# Patient Record
Sex: Female | Born: 1985 | Hispanic: Yes | Marital: Single
Health system: Southern US, Academic
[De-identification: ages and names within clinical notes are randomized; demographics above are authoritative.]

## PROBLEM LIST (undated history)

## (undated) ENCOUNTER — Encounter: Attending: Family | Primary: Family

## (undated) ENCOUNTER — Encounter: Attending: Internal Medicine | Primary: Internal Medicine

## (undated) ENCOUNTER — Ambulatory Visit: Payer: PRIVATE HEALTH INSURANCE

## (undated) ENCOUNTER — Encounter

## (undated) ENCOUNTER — Ambulatory Visit

## (undated) ENCOUNTER — Telehealth

## (undated) ENCOUNTER — Ambulatory Visit: Payer: Worker's Compensation | Attending: Retina Specialist | Primary: Retina Specialist

## (undated) ENCOUNTER — Telehealth: Attending: Family | Primary: Family

## (undated) ENCOUNTER — Ambulatory Visit: Payer: PRIVATE HEALTH INSURANCE | Attending: Dermatology | Primary: Dermatology

## (undated) ENCOUNTER — Encounter: Payer: Medicaid (Managed Care) | Attending: Nutritionist | Primary: Nutritionist

## (undated) ENCOUNTER — Ambulatory Visit: Payer: Medicaid (Managed Care)

## (undated) ENCOUNTER — Ambulatory Visit
Payer: PRIVATE HEALTH INSURANCE | Attending: Student in an Organized Health Care Education/Training Program | Primary: Student in an Organized Health Care Education/Training Program

## (undated) ENCOUNTER — Ambulatory Visit: Payer: PRIVATE HEALTH INSURANCE | Attending: Family | Primary: Family

## (undated) ENCOUNTER — Ambulatory Visit: Payer: PRIVATE HEALTH INSURANCE | Attending: Internal Medicine | Primary: Internal Medicine

## (undated) ENCOUNTER — Inpatient Hospital Stay

## (undated) ENCOUNTER — Telehealth: Attending: Ambulatory Care | Primary: Ambulatory Care

## (undated) ENCOUNTER — Encounter: Attending: "Endocrinology | Primary: "Endocrinology

## (undated) ENCOUNTER — Ambulatory Visit: Payer: Medicaid (Managed Care) | Attending: Family | Primary: Family

## (undated) ENCOUNTER — Ambulatory Visit: Payer: Vision Other Private Insurance | Attending: Family | Primary: Family

## (undated) ENCOUNTER — Encounter
Attending: Student in an Organized Health Care Education/Training Program | Primary: Student in an Organized Health Care Education/Training Program

## (undated) ENCOUNTER — Ambulatory Visit: Payer: MEDICAID | Attending: Family | Primary: Family

## (undated) ENCOUNTER — Ambulatory Visit: Attending: Family | Primary: Family

## (undated) ENCOUNTER — Ambulatory Visit: Attending: Pharmacist | Primary: Pharmacist

## (undated) ENCOUNTER — Encounter: Attending: Dermatology | Primary: Dermatology

## (undated) ENCOUNTER — Ambulatory Visit: Payer: PRIVATE HEALTH INSURANCE | Attending: "Endocrinology | Primary: "Endocrinology

## (undated) ENCOUNTER — Encounter: Attending: Ambulatory Care | Primary: Ambulatory Care

## (undated) ENCOUNTER — Ambulatory Visit: Payer: PRIVATE HEALTH INSURANCE | Attending: Retina Specialist | Primary: Retina Specialist

## (undated) ENCOUNTER — Ambulatory Visit
Payer: Vision Other Private Insurance | Attending: Rehabilitative and Restorative Service Providers" | Primary: Rehabilitative and Restorative Service Providers"

## (undated) ENCOUNTER — Ambulatory Visit: Attending: Retina Specialist | Primary: Retina Specialist

## (undated) ENCOUNTER — Ambulatory Visit
Payer: Medicaid (Managed Care) | Attending: Student in an Organized Health Care Education/Training Program | Primary: Student in an Organized Health Care Education/Training Program

## (undated) ENCOUNTER — Telehealth
Attending: Student in an Organized Health Care Education/Training Program | Primary: Student in an Organized Health Care Education/Training Program

## (undated) ENCOUNTER — Ambulatory Visit
Payer: PRIVATE HEALTH INSURANCE | Attending: Physical Medicine & Rehabilitation | Primary: Physical Medicine & Rehabilitation

## (undated) ENCOUNTER — Ambulatory Visit: Attending: Internal Medicine | Primary: Internal Medicine

## (undated) ENCOUNTER — Encounter: Attending: Pharmacist | Primary: Pharmacist

## (undated) ENCOUNTER — Institutional Professional Consult (permissible substitution): Payer: PRIVATE HEALTH INSURANCE | Attending: Internal Medicine | Primary: Internal Medicine

## (undated) ENCOUNTER — Ambulatory Visit: Payer: Medicare (Managed Care)

## (undated) ENCOUNTER — Encounter: Payer: Medicaid (Managed Care) | Attending: Family | Primary: Family

## (undated) ENCOUNTER — Encounter: Attending: Women's Health | Primary: Women's Health

## (undated) ENCOUNTER — Encounter: Attending: Retina Specialist | Primary: Retina Specialist

## (undated) ENCOUNTER — Encounter: Attending: Hematology | Primary: Hematology

## (undated) ENCOUNTER — Telehealth: Attending: Internal Medicine | Primary: Internal Medicine

---
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
## 2007-04-18 ENCOUNTER — Emergency Department: Payer: Self-pay | Admitting: Internal Medicine

## 2008-10-28 IMAGING — CR RIGHT TIBIA AND FIBULA - 2 VIEW
1 series · 2 of 2 positions shown · non-contrast
Comparison: none

REASON FOR EXAM: injury
COMMENTS:   LMP: [DATE]

RESULT:     AP lateral views of the RIGHT tibia and fibula reveal the shafts
of the bones to be intact. There is no periosteal reaction, fracture, lytic
or blastic lesion. The observed portions of the knee and ankle are normal.

[Series 1: view not recorded · 0.17mm/px · 2 of 2 slices shown]
[im 1/2]
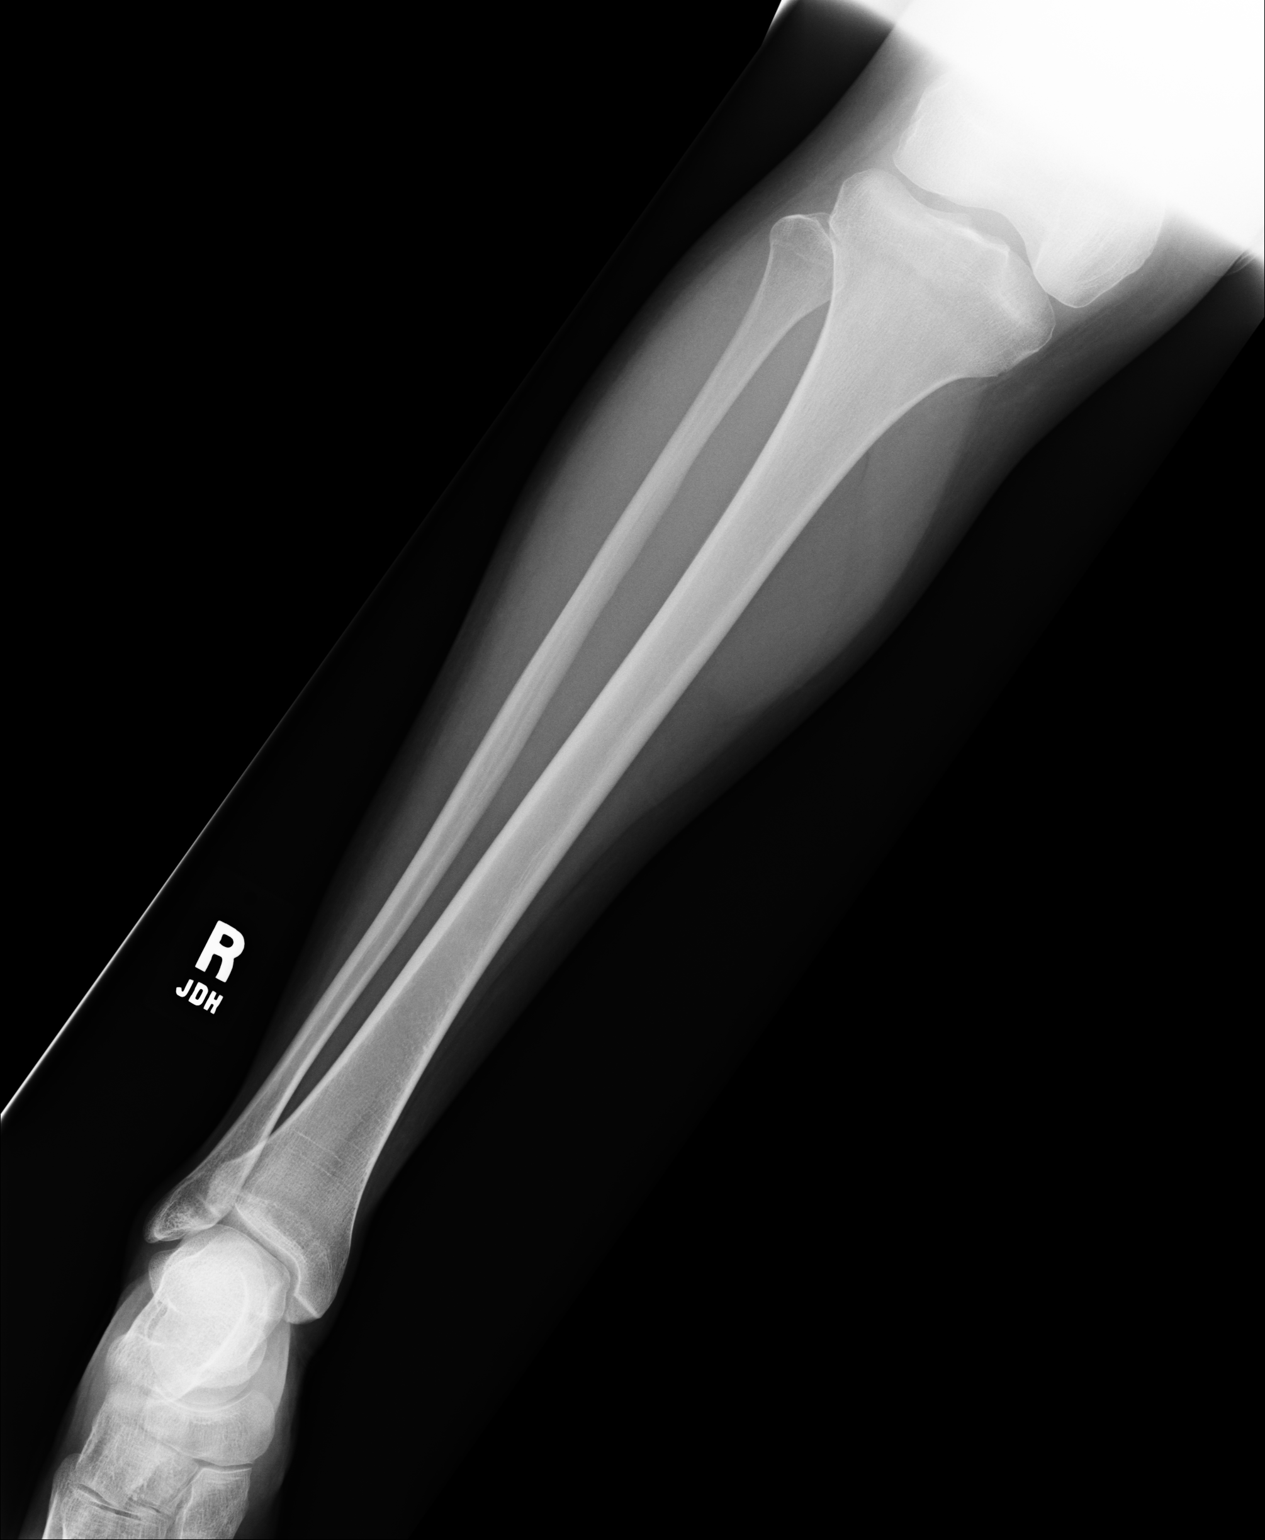
[im 2/2]
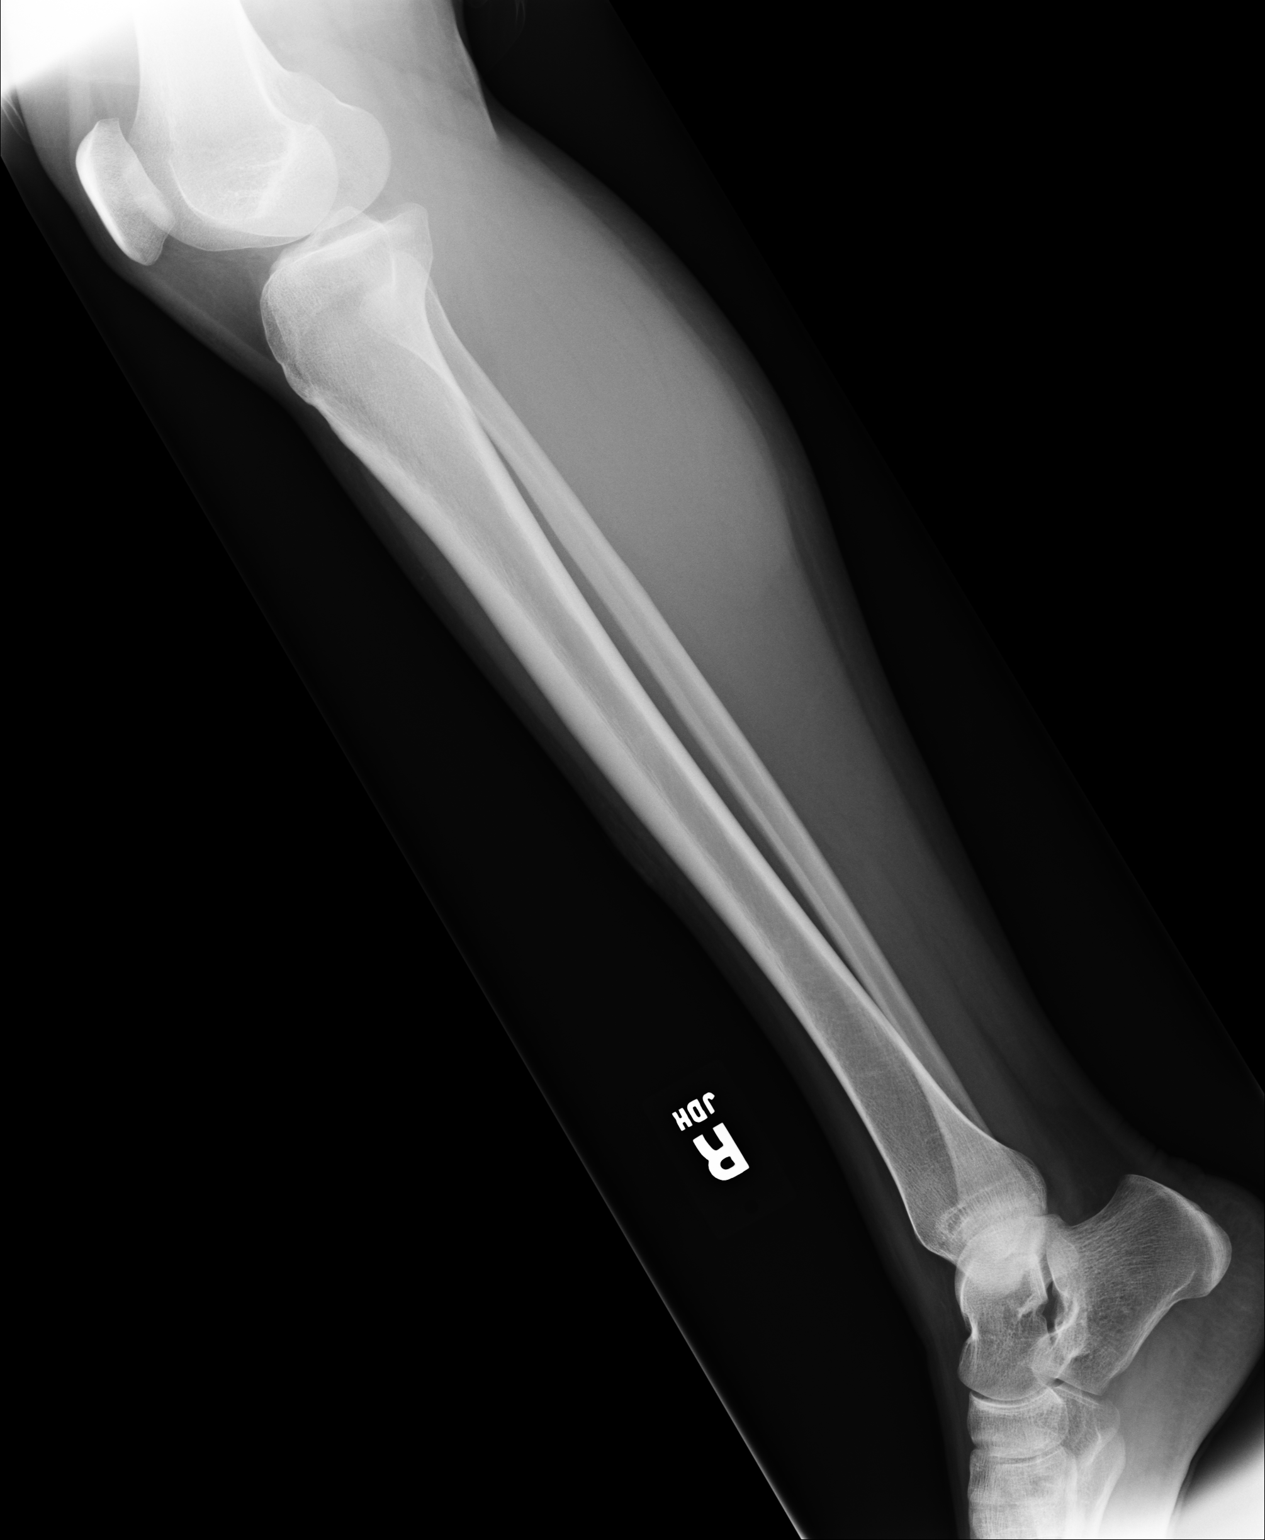

[2 of 2 positions shown; findings below may reference images not displayed]

IMPRESSION: I see no acute abnormality of the shafts of the RIGHT tibia or fibula.
Followup films are available coned to any area of persistent symptoms.

## 2011-12-17 ENCOUNTER — Emergency Department: Payer: Self-pay | Admitting: Emergency Medicine

## 2012-10-25 ENCOUNTER — Emergency Department: Payer: Self-pay | Admitting: Emergency Medicine

## 2012-10-25 LAB — COMPREHENSIVE METABOLIC PANEL
Albumin: 3.5 g/dL (ref 3.4–5.0)
Alkaline Phosphatase: 69 U/L (ref 50–136)
Anion Gap: 5 — ABNORMAL LOW (ref 7–16)
BUN: 7 mg/dL (ref 7–18)
Bilirubin,Total: 0.3 mg/dL (ref 0.2–1.0)
EGFR (Non-African Amer.): >60
Glucose: 84 mg/dL (ref 65–99)
Potassium: 3.1 mmol/L — ABNORMAL LOW (ref 3.5–5.1)
SGPT (ALT): 30 U/L (ref 12–78)
Sodium: 139 mmol/L (ref 136–145)
Total Protein: 7.6 g/dL (ref 6.4–8.2)

## 2012-10-25 LAB — URINALYSIS, COMPLETE
Glucose,UR: NEGATIVE mg/dL (ref 0–75)
Protein: NEGATIVE
Squamous Epithelial: 3
WBC UR: 105 /HPF (ref 0–5)

## 2012-10-25 LAB — CBC
MCH: 25 pg — ABNORMAL LOW (ref 26.0–34.0)
MCHC: 30 g/dL — ABNORMAL LOW (ref 32.0–36.0)
Platelet: 247 10*3/uL (ref 150–440)
RDW: 15.4 % — ABNORMAL HIGH (ref 11.5–14.5)
WBC: 7.2 10*3/uL (ref 3.6–11.0)

## 2017-02-20 ENCOUNTER — Ambulatory Visit
Admission: RE | Admit: 2017-02-20 | Discharge: 2017-02-20 | Disposition: A | Discharge status: Home / Self Care | Payer: BC Managed Care – PPO

## 2017-02-20 DIAGNOSIS — M3219 Other organ or system involvement in systemic lupus erythematosus: Principal | ICD-10-CM

## 2017-02-20 MED ORDER — OXYCODONE 5 MG TABLET
ORAL_TABLET | 0 refills | 0 days | Status: CP
Start: 2017-02-20 — End: 2017-03-13

## 2017-02-27 MED ORDER — AZATHIOPRINE 50 MG TABLET
ORAL_TABLET | Freq: Every day | ORAL | 2 refills | 0 days | Status: CP
Start: 2017-02-27 — End: 2017-07-07

## 2017-03-13 ENCOUNTER — Ambulatory Visit
Admission: RE | Admit: 2017-03-13 | Discharge: 2017-03-13 | Disposition: A | Discharge status: Home / Self Care | Payer: BC Managed Care – PPO | Attending: Medical | Admitting: Medical

## 2017-03-13 DIAGNOSIS — M3219 Other organ or system involvement in systemic lupus erythematosus: Principal | ICD-10-CM

## 2017-03-13 DIAGNOSIS — G8929 Other chronic pain: Secondary | ICD-10-CM

## 2017-03-23 MED ORDER — OXYCODONE 5 MG TABLET
ORAL_TABLET | 0 refills | 0 days | Status: CP
Start: 2017-03-23 — End: 2017-07-07

## 2017-04-09 MED ORDER — L NORGEST/E ESTRADIOL-E ESTRAD 0.15 MG-30 MCG (84)/10 MCG(7) TABS,3MOS
ORAL_TABLET | Freq: Every day | ORAL | 3 refills | 0 days | Status: CP
Start: 2017-04-09 — End: 2018-03-29

## 2017-04-22 MED ORDER — OXYCODONE 5 MG TABLET
ORAL_TABLET | 0 refills | 0 days | Status: CP
Start: 2017-04-22 — End: 2017-06-16

## 2017-05-22 MED ORDER — OXYCODONE 5 MG TABLET
ORAL_TABLET | 0 refills | 0 days | Status: CP
Start: 2017-05-22 — End: 2017-11-06

## 2017-06-02 ENCOUNTER — Emergency Department
Admission: EM | Admit: 2017-06-02 | Discharge: 2017-06-03 | Disposition: A | Discharge status: Home / Self Care | Payer: BC Managed Care – PPO | Source: Intra-hospital

## 2017-06-02 DIAGNOSIS — M7989 Other specified soft tissue disorders: Secondary | ICD-10-CM

## 2017-06-02 DIAGNOSIS — M25562 Pain in left knee: Principal | ICD-10-CM

## 2017-06-16 MED ORDER — OXYCODONE 5 MG TABLET
ORAL_TABLET | 0 refills | 0 days | Status: CP
Start: 2017-06-16 — End: 2017-07-07

## 2017-07-07 ENCOUNTER — Ambulatory Visit: Admission: RE | Admit: 2017-07-07 | Discharge: 2017-07-07 | Disposition: A | Discharge status: Home / Self Care

## 2017-07-07 DIAGNOSIS — L0390 Cellulitis, unspecified: Secondary | ICD-10-CM

## 2017-07-07 DIAGNOSIS — M3219 Other organ or system involvement in systemic lupus erythematosus: Secondary | ICD-10-CM

## 2017-07-07 DIAGNOSIS — G8929 Other chronic pain: Principal | ICD-10-CM

## 2017-07-07 MED ORDER — OXYCODONE 5 MG TABLET
ORAL_TABLET | ORAL | 0 refills | 0.00000 days | Status: CP
Start: 2017-07-07 — End: 2018-01-13

## 2017-07-07 MED ORDER — DOXYCYCLINE HYCLATE 100 MG TABLET,DELAYED RELEASE
ORAL_TABLET | 0 refills | 0 days | Status: CP
Start: 2017-07-07 — End: 2017-12-15

## 2017-07-07 MED ORDER — OXYCODONE 5 MG TABLET: tablet | 0 refills | 0 days | Status: AC

## 2017-07-07 MED ORDER — HYDROXYCHLOROQUINE 200 MG TABLET
ORAL_TABLET | Freq: Every day | ORAL | 10 refills | 0.00000 days | Status: CP
Start: 2017-07-07 — End: 2017-12-15

## 2017-07-07 MED ORDER — AZATHIOPRINE 50 MG TABLET
ORAL_TABLET | Freq: Every day | ORAL | 4 refills | 0 days | Status: CP
Start: 2017-07-07 — End: 2017-12-15

## 2017-07-23 MED ORDER — TIZANIDINE 2 MG TABLET
ORAL_TABLET | Freq: Three times a day (TID) | ORAL | 3 refills | 0 days | Status: CP | PRN
Start: 2017-07-23 — End: 2018-01-11

## 2017-09-29 MED ORDER — OXYCODONE 5 MG TABLET
ORAL_TABLET | 0 refills | 0 days | Status: CP
Start: 2017-09-29 — End: 2017-10-23

## 2017-10-26 MED ORDER — OXYCODONE 5 MG TABLET
ORAL_TABLET | 0 refills | 0 days | Status: CP
Start: 2017-10-26 — End: 2017-11-22

## 2017-11-06 ENCOUNTER — Ambulatory Visit
Admit: 2017-11-06 | Discharge: 2017-11-07 | Payer: PRIVATE HEALTH INSURANCE | Attending: "Endocrinology | Primary: "Endocrinology

## 2017-11-06 DIAGNOSIS — N915 Oligomenorrhea, unspecified: Secondary | ICD-10-CM

## 2017-11-06 DIAGNOSIS — M8580 Other specified disorders of bone density and structure, unspecified site: Principal | ICD-10-CM

## 2017-11-09 MED ORDER — ERGOCALCIFEROL (VITAMIN D2) 1,250 MCG (50,000 UNIT) CAPSULE
ORAL_CAPSULE | ORAL | 0 refills | 0.00000 days | Status: CP
Start: 2017-11-09 — End: 2018-01-26

## 2017-11-23 MED ORDER — OXYCODONE 5 MG TABLET
ORAL_TABLET | 0 refills | 0 days | Status: CP
Start: 2017-11-23 — End: 2017-12-23

## 2017-12-15 ENCOUNTER — Ambulatory Visit
Admit: 2017-12-15 | Discharge: 2017-12-16 | Payer: PRIVATE HEALTH INSURANCE | Attending: Internal Medicine | Primary: Internal Medicine

## 2017-12-15 DIAGNOSIS — M3219 Other organ or system involvement in systemic lupus erythematosus: Principal | ICD-10-CM

## 2017-12-15 DIAGNOSIS — M5440 Lumbago with sciatica, unspecified side: Secondary | ICD-10-CM

## 2017-12-15 DIAGNOSIS — M329 Systemic lupus erythematosus, unspecified: Secondary | ICD-10-CM

## 2017-12-15 DIAGNOSIS — G8929 Other chronic pain: Secondary | ICD-10-CM

## 2017-12-15 MED ORDER — AZATHIOPRINE 50 MG TABLET
ORAL_TABLET | Freq: Every day | ORAL | 4 refills | 0.00000 days | Status: CP
Start: 2017-12-15 — End: 2017-12-24

## 2017-12-15 MED ORDER — HYDROXYCHLOROQUINE 200 MG TABLET
ORAL_TABLET | Freq: Every day | ORAL | 10 refills | 0 days | Status: CP
Start: 2017-12-15 — End: 2018-07-29

## 2017-12-15 MED ORDER — PREDNISONE 1 MG TABLET
ORAL_TABLET | Freq: Every day | ORAL | 3 refills | 0 days | Status: CP
Start: 2017-12-15 — End: 2018-03-01

## 2017-12-15 MED ORDER — GABAPENTIN 300 MG CAPSULE
ORAL_CAPSULE | 3 refills | 0 days | Status: CP
Start: 2017-12-15 — End: 2018-01-15

## 2017-12-23 MED ORDER — TIZANIDINE 2 MG TABLET
ORAL_TABLET | Freq: Three times a day (TID) | ORAL | 0 refills | 0.00000 days | Status: CP | PRN
Start: 2017-12-23 — End: 2018-01-11

## 2017-12-23 MED ORDER — OXYCODONE 5 MG TABLET
ORAL_TABLET | 0 refills | 0 days | Status: CP
Start: 2017-12-23 — End: 2018-01-13

## 2017-12-24 MED ORDER — AZATHIOPRINE 50 MG TABLET
ORAL_TABLET | Freq: Every day | ORAL | 0 refills | 0 days | Status: CP
Start: 2017-12-24 — End: 2018-07-06

## 2018-01-13 MED ORDER — OXYCODONE 5 MG TABLET
ORAL_TABLET | 0 refills | 0 days | Status: CP
Start: 2018-01-13 — End: 2018-03-01

## 2018-01-15 MED ORDER — GABAPENTIN 300 MG CAPSULE
ORAL_CAPSULE | 0 refills | 0 days | Status: CP
Start: 2018-01-15 — End: 2018-03-12

## 2018-01-18 MED ORDER — TIZANIDINE 2 MG TABLET
ORAL_TABLET | Freq: Three times a day (TID) | ORAL | 0 refills | 0 days | Status: CP | PRN
Start: 2018-01-18 — End: 2018-03-01

## 2018-02-12 ENCOUNTER — Ambulatory Visit
Admit: 2018-02-12 | Discharge: 2018-02-12 | Payer: PRIVATE HEALTH INSURANCE | Attending: "Endocrinology | Primary: "Endocrinology

## 2018-02-12 ENCOUNTER — Ambulatory Visit: Admit: 2018-02-12 | Discharge: 2018-02-12 | Payer: PRIVATE HEALTH INSURANCE

## 2018-02-12 DIAGNOSIS — M8580 Other specified disorders of bone density and structure, unspecified site: Principal | ICD-10-CM

## 2018-03-02 MED ORDER — OXYCODONE 5 MG TABLET
ORAL_TABLET | 0 refills | 0 days | Status: CP
Start: 2018-03-02 — End: 2018-04-02

## 2018-03-02 MED ORDER — PREDNISONE 1 MG TABLET
ORAL_TABLET | Freq: Every day | ORAL | 3 refills | 0.00000 days | Status: CP
Start: 2018-03-02 — End: 2018-04-02

## 2018-03-02 MED ORDER — TIZANIDINE 2 MG TABLET
ORAL_TABLET | Freq: Three times a day (TID) | ORAL | 0 refills | 0 days | Status: CP | PRN
Start: 2018-03-02 — End: 2018-05-04

## 2018-03-12 ENCOUNTER — Ambulatory Visit
Admit: 2018-03-12 | Discharge: 2018-03-13 | Payer: PRIVATE HEALTH INSURANCE | Attending: "Endocrinology | Primary: "Endocrinology

## 2018-03-12 DIAGNOSIS — N915 Oligomenorrhea, unspecified: Secondary | ICD-10-CM

## 2018-03-12 DIAGNOSIS — M8580 Other specified disorders of bone density and structure, unspecified site: Principal | ICD-10-CM

## 2018-03-31 MED ORDER — L NORGEST/E ESTRADIOL-E ESTRAD 0.15 MG-30 MCG (84)/10 MCG(7) TABS,3MOS
ORAL_TABLET | Freq: Every day | ORAL | 3 refills | 0 days | Status: CP
Start: 2018-03-31 — End: 2018-06-11

## 2018-04-02 ENCOUNTER — Ambulatory Visit: Admit: 2018-04-02 | Discharge: 2018-04-03 | Payer: PRIVATE HEALTH INSURANCE

## 2018-04-02 DIAGNOSIS — G8929 Other chronic pain: Secondary | ICD-10-CM

## 2018-04-02 DIAGNOSIS — M329 Systemic lupus erythematosus, unspecified: Principal | ICD-10-CM

## 2018-04-02 DIAGNOSIS — N926 Irregular menstruation, unspecified: Secondary | ICD-10-CM

## 2018-04-02 MED ORDER — OXYCODONE 5 MG TABLET
ORAL_TABLET | 0 refills | 0 days | Status: CP
Start: 2018-04-02 — End: 2018-05-03

## 2018-04-02 MED ORDER — PREDNISONE 1 MG TABLET
ORAL_TABLET | Freq: Every day | ORAL | 3 refills | 0 days | Status: CP
Start: 2018-04-02 — End: 2018-10-08

## 2018-04-20 MED ORDER — TRIAMCINOLONE ACETONIDE 0.1 % TOPICAL OINTMENT
Freq: Two times a day (BID) | TOPICAL | 1 refills | 0.00000 days | Status: CP | PRN
Start: 2018-04-20 — End: 2019-04-20

## 2018-05-04 MED ORDER — OXYCODONE 5 MG TABLET: tablet | 0 refills | 0 days | Status: AC

## 2018-05-04 MED ORDER — OXYCODONE 5 MG TABLET
ORAL_TABLET | ORAL | 0 refills | 0.00000 days | Status: CP
Start: 2018-05-04 — End: 2018-05-31

## 2018-05-07 MED ORDER — TIZANIDINE 2 MG TABLET
ORAL_TABLET | Freq: Three times a day (TID) | ORAL | 5 refills | 0 days | Status: CP | PRN
Start: 2018-05-07 — End: 2018-06-30

## 2018-05-14 ENCOUNTER — Ambulatory Visit: Admit: 2018-05-14 | Discharge: 2018-05-15 | Payer: PRIVATE HEALTH INSURANCE

## 2018-05-14 DIAGNOSIS — Z79899 Other long term (current) drug therapy: Principal | ICD-10-CM

## 2018-06-01 MED ORDER — OXYCODONE 5 MG TABLET
ORAL_TABLET | 0 refills | 0 days | Status: CP
Start: 2018-06-01 — End: 2018-07-02

## 2018-06-11 ENCOUNTER — Ambulatory Visit: Admit: 2018-06-11 | Discharge: 2018-06-12 | Payer: PRIVATE HEALTH INSURANCE

## 2018-06-11 DIAGNOSIS — Z01419 Encounter for gynecological examination (general) (routine) without abnormal findings: Principal | ICD-10-CM

## 2018-06-11 MED ORDER — L NORGEST/E ESTRADIOL-E ESTRAD 0.15 MG-30 MCG (84)/10 MCG(7) TABS,3MOS
ORAL_TABLET | Freq: Every day | ORAL | 3 refills | 0.00000 days | Status: CP
Start: 2018-06-11 — End: 2019-03-30

## 2018-07-02 MED ORDER — OXYCODONE 5 MG TABLET
ORAL_TABLET | 0 refills | 0 days | Status: CP
Start: 2018-07-02 — End: 2018-07-29

## 2018-07-02 MED ORDER — TIZANIDINE 2 MG TABLET
ORAL_TABLET | Freq: Three times a day (TID) | ORAL | 5 refills | 0.00000 days | Status: CP | PRN
Start: 2018-07-02 — End: 2018-10-21

## 2018-07-06 ENCOUNTER — Ambulatory Visit: Admit: 2018-07-06 | Discharge: 2018-07-07 | Payer: PRIVATE HEALTH INSURANCE

## 2018-07-06 ENCOUNTER — Ambulatory Visit
Admit: 2018-07-06 | Discharge: 2018-07-07 | Payer: PRIVATE HEALTH INSURANCE | Attending: Internal Medicine | Primary: Internal Medicine

## 2018-07-06 DIAGNOSIS — M549 Dorsalgia, unspecified: Secondary | ICD-10-CM

## 2018-07-06 DIAGNOSIS — M3219 Other organ or system involvement in systemic lupus erythematosus: Secondary | ICD-10-CM

## 2018-07-06 DIAGNOSIS — R05 Cough: Principal | ICD-10-CM

## 2018-07-06 DIAGNOSIS — M329 Systemic lupus erythematosus, unspecified: Secondary | ICD-10-CM

## 2018-07-06 DIAGNOSIS — L659 Nonscarring hair loss, unspecified: Secondary | ICD-10-CM

## 2018-07-06 DIAGNOSIS — R634 Abnormal weight loss: Secondary | ICD-10-CM

## 2018-07-06 DIAGNOSIS — R238 Other skin changes: Secondary | ICD-10-CM

## 2018-07-06 MED ORDER — CLOBETASOL 0.05 % TOPICAL FOAM
Freq: Two times a day (BID) | TOPICAL | 0 refills | 0 days | Status: CP
Start: 2018-07-06 — End: 2018-07-29

## 2018-07-06 MED ORDER — AZATHIOPRINE 50 MG TABLET
ORAL_TABLET | Freq: Every day | ORAL | 0 refills | 0 days | Status: CP
Start: 2018-07-06 — End: 2019-01-04

## 2018-07-06 MED ORDER — DULOXETINE 30 MG CAPSULE,DELAYED RELEASE
ORAL_CAPSULE | 1 refills | 0 days | Status: CP
Start: 2018-07-06 — End: 2018-08-02

## 2018-07-30 ENCOUNTER — Ambulatory Visit
Admit: 2018-07-30 | Discharge: 2018-07-31 | Payer: PRIVATE HEALTH INSURANCE | Attending: Dermatology | Primary: Dermatology

## 2018-07-30 DIAGNOSIS — M329 Systemic lupus erythematosus, unspecified: Principal | ICD-10-CM

## 2018-07-30 MED ORDER — OXYCODONE 5 MG TABLET
ORAL_TABLET | 0 refills | 0 days | Status: CP
Start: 2018-07-30 — End: 2018-08-30

## 2018-07-30 MED ORDER — CLOBETASOL 0.05 % TOPICAL FOAM
Freq: Two times a day (BID) | TOPICAL | 0 refills | 0 days | Status: CP
Start: 2018-07-30 — End: 2018-08-02

## 2018-07-30 MED ORDER — FLUOCINONIDE 0.05 % TOPICAL SOLUTION
Freq: Two times a day (BID) | TOPICAL | 5 refills | 0 days | Status: CP
Start: 2018-07-30 — End: 2019-07-30

## 2018-07-30 MED ORDER — HYDROXYCHLOROQUINE 200 MG TABLET
ORAL_TABLET | Freq: Every day | ORAL | 10 refills | 0.00000 days | Status: CP
Start: 2018-07-30 — End: ?

## 2018-08-02 ENCOUNTER — Ambulatory Visit: Admit: 2018-08-02 | Discharge: 2018-08-02 | Payer: PRIVATE HEALTH INSURANCE

## 2018-08-02 DIAGNOSIS — R634 Abnormal weight loss: Secondary | ICD-10-CM

## 2018-08-02 DIAGNOSIS — M549 Dorsalgia, unspecified: Secondary | ICD-10-CM

## 2018-08-02 DIAGNOSIS — R05 Cough: Principal | ICD-10-CM

## 2018-08-02 DIAGNOSIS — R87810 Cervical high risk human papillomavirus (HPV) DNA test positive: Principal | ICD-10-CM

## 2018-08-02 DIAGNOSIS — M3219 Other organ or system involvement in systemic lupus erythematosus: Secondary | ICD-10-CM

## 2018-08-02 MED ORDER — DULOXETINE 60 MG CAPSULE,DELAYED RELEASE
ORAL_CAPSULE | Freq: Every day | ORAL | 3 refills | 0.00000 days | Status: CP
Start: 2018-08-02 — End: 2018-10-08

## 2018-08-02 MED ORDER — CLOBETASOL 0.05 % TOPICAL GEL
3 refills | 0 days | Status: CP
Start: 2018-08-02 — End: ?

## 2018-08-02 MED ORDER — DULOXETINE 30 MG CAPSULE,DELAYED RELEASE
ORAL_CAPSULE | Freq: Every day | ORAL | 1 refills | 0 days | Status: CP
Start: 2018-08-02 — End: 2018-08-02

## 2018-08-16 MED ORDER — KETOCONAZOLE 2 % SHAMPOO
TOPICAL | 6 refills | 0.00000 days | Status: CP
Start: 2018-08-16 — End: ?

## 2018-08-16 MED ORDER — CLOBETASOL 0.05 % TOPICAL FOAM
OPHTHALMIC | 5 refills | 0.00000 days | Status: CP
Start: 2018-08-16 — End: ?

## 2018-08-16 MED ORDER — CLOBETASOL-EMOLLIENT 0.05 % TOPICAL FOAM
OPHTHALMIC | 2 refills | 0.00000 days | Status: CP
Start: 2018-08-16 — End: 2018-08-16

## 2018-08-31 MED ORDER — OXYCODONE 5 MG TABLET
ORAL_TABLET | 0 refills | 0 days | Status: CP
Start: 2018-08-31 — End: 2018-10-01

## 2018-09-24 MED ORDER — GABAPENTIN 100 MG CAPSULE
ORAL_CAPSULE | 2 refills | 0 days | Status: CP
Start: 2018-09-24 — End: 2018-10-08

## 2018-10-04 MED ORDER — OXYCODONE 5 MG TABLET
ORAL_TABLET | 0 refills | 0 days | Status: CP
Start: 2018-10-04 — End: 2018-10-28

## 2018-10-08 ENCOUNTER — Ambulatory Visit: Admit: 2018-10-08 | Discharge: 2018-10-09 | Payer: PRIVATE HEALTH INSURANCE

## 2018-10-08 DIAGNOSIS — M3219 Other organ or system involvement in systemic lupus erythematosus: Principal | ICD-10-CM

## 2018-10-08 DIAGNOSIS — G8929 Other chronic pain: Secondary | ICD-10-CM

## 2018-10-08 DIAGNOSIS — L659 Nonscarring hair loss, unspecified: Secondary | ICD-10-CM

## 2018-10-08 DIAGNOSIS — N926 Irregular menstruation, unspecified: Secondary | ICD-10-CM

## 2018-10-08 DIAGNOSIS — B0229 Other postherpetic nervous system involvement: Secondary | ICD-10-CM

## 2018-10-08 MED ORDER — LIDOCAINE 5 % TOPICAL PATCH
MEDICATED_PATCH | Freq: Two times a day (BID) | TRANSDERMAL | 3 refills | 0.00000 days | Status: CP
Start: 2018-10-08 — End: 2019-10-08

## 2018-10-12 DIAGNOSIS — G8929 Other chronic pain: Principal | ICD-10-CM

## 2018-10-21 DIAGNOSIS — M7918 Myalgia, other site: Principal | ICD-10-CM

## 2018-10-22 MED ORDER — TIZANIDINE 2 MG TABLET
ORAL_TABLET | Freq: Three times a day (TID) | ORAL | 5 refills | 0 days | Status: CP | PRN
Start: 2018-10-22 — End: 2019-03-30

## 2018-10-28 DIAGNOSIS — G8929 Other chronic pain: Principal | ICD-10-CM

## 2018-10-29 MED ORDER — OXYCODONE 5 MG TABLET
ORAL_TABLET | 0 refills | 0 days | Status: CP
Start: 2018-10-29 — End: 2018-11-30

## 2018-12-03 MED ORDER — OXYCODONE 5 MG TABLET
ORAL_TABLET | 0 refills | 0 days | Status: CP
Start: 2018-12-03 — End: 2019-02-28

## 2018-12-07 MED ORDER — OXYCODONE 5 MG TABLET
ORAL_TABLET | 0 refills | 0 days | Status: CP
Start: 2018-12-07 — End: 2018-12-27

## 2018-12-28 MED ORDER — OXYCODONE 5 MG TABLET
ORAL_TABLET | 0 refills | 0 days | Status: CP
Start: 2018-12-28 — End: 2019-01-28

## 2019-01-04 DIAGNOSIS — M329 Systemic lupus erythematosus, unspecified: Secondary | ICD-10-CM

## 2019-01-04 DIAGNOSIS — M3219 Other organ or system involvement in systemic lupus erythematosus: Principal | ICD-10-CM

## 2019-01-04 DIAGNOSIS — G8929 Other chronic pain: Secondary | ICD-10-CM

## 2019-01-25 MED ORDER — DULOXETINE 30 MG CAPSULE,DELAYED RELEASE
ORAL_CAPSULE | 0 refills | 0 days | Status: CP
Start: 2019-01-25 — End: ?

## 2019-01-25 MED ORDER — AZATHIOPRINE 50 MG TABLET
ORAL_TABLET | Freq: Every day | ORAL | 2 refills | 0.00000 days | Status: CP
Start: 2019-01-25 — End: ?

## 2019-01-31 MED ORDER — OXYCODONE 5 MG TABLET
ORAL_TABLET | 0 refills | 0 days | Status: CP
Start: 2019-01-31 — End: 2019-02-28

## 2019-02-04 ENCOUNTER — Ambulatory Visit: Admit: 2019-02-04 | Discharge: 2019-02-05 | Payer: PRIVATE HEALTH INSURANCE

## 2019-02-04 DIAGNOSIS — M329 Systemic lupus erythematosus, unspecified: Principal | ICD-10-CM

## 2019-03-01 MED ORDER — OXYCODONE 5 MG TABLET
ORAL_TABLET | 0 refills | 0 days | Status: CP
Start: 2019-03-01 — End: 2019-03-28

## 2019-03-31 MED ORDER — L NORGEST/E ESTRADIOL-E ESTRAD 0.15 MG-30 MCG (84)/10 MCG(7) TABS,3MOS
ORAL_TABLET | Freq: Every day | ORAL | 3 refills | 0.00000 days | Status: CP
Start: 2019-03-31 — End: ?

## 2019-03-31 MED ORDER — OXYCODONE 5 MG TABLET
ORAL_TABLET | 0 refills | 0 days | Status: CP
Start: 2019-03-31 — End: ?

## 2019-03-31 MED ORDER — TIZANIDINE 2 MG TABLET
ORAL_TABLET | Freq: Three times a day (TID) | ORAL | 5 refills | 10.00000 days | Status: CP | PRN
Start: 2019-03-31 — End: ?

## 2019-04-29 ENCOUNTER — Ambulatory Visit
Admit: 2019-04-29 | Discharge: 2019-04-30 | Payer: PRIVATE HEALTH INSURANCE | Attending: Internal Medicine | Primary: Internal Medicine

## 2019-04-29 DIAGNOSIS — N926 Irregular menstruation, unspecified: Secondary | ICD-10-CM

## 2019-04-29 DIAGNOSIS — Z79899 Other long term (current) drug therapy: Secondary | ICD-10-CM

## 2019-04-29 DIAGNOSIS — M329 Systemic lupus erythematosus, unspecified: Secondary | ICD-10-CM

## 2019-04-29 DIAGNOSIS — M8580 Other specified disorders of bone density and structure, unspecified site: Secondary | ICD-10-CM

## 2019-04-29 DIAGNOSIS — Z23 Encounter for immunization: Secondary | ICD-10-CM

## 2019-04-29 DIAGNOSIS — G8929 Other chronic pain: Secondary | ICD-10-CM

## 2019-05-01 MED ORDER — OXYCODONE 5 MG TABLET
ORAL_TABLET | 0 refills | 0 days | Status: CP
Start: 2019-05-01 — End: ?

## 2019-06-05 DIAGNOSIS — G8929 Other chronic pain: Principal | ICD-10-CM

## 2019-06-05 MED ORDER — OXYCODONE 5 MG TABLET
ORAL_TABLET | 0 refills | 0 days | Status: CP
Start: 2019-06-05 — End: ?

## 2019-06-17 ENCOUNTER — Ambulatory Visit: Admit: 2019-06-17 | Discharge: 2019-06-18 | Payer: PRIVATE HEALTH INSURANCE

## 2019-06-17 DIAGNOSIS — N870 Mild cervical dysplasia: Principal | ICD-10-CM

## 2019-06-17 DIAGNOSIS — Z01419 Encounter for gynecological examination (general) (routine) without abnormal findings: Principal | ICD-10-CM

## 2019-06-17 MED ORDER — L NORGEST/E ESTRADIOL-E ESTRAD 0.15 MG-30 MCG (84)/10 MCG(7) TABS,3MOS
ORAL_TABLET | Freq: Every day | ORAL | 3 refills | 0.00000 days | Status: CP
Start: 2019-06-17 — End: ?

## 2019-06-28 DIAGNOSIS — G8929 Other chronic pain: Principal | ICD-10-CM

## 2019-06-29 MED ORDER — OXYCODONE 5 MG TABLET
ORAL_TABLET | 0 refills | 0 days | Status: CP
Start: 2019-06-29 — End: ?

## 2019-07-29 ENCOUNTER — Ambulatory Visit: Admit: 2019-07-29 | Discharge: 2019-07-30 | Payer: PRIVATE HEALTH INSURANCE

## 2019-07-29 DIAGNOSIS — R87611 Atypical squamous cells cannot exclude high grade squamous intraepithelial lesion on cytologic smear of cervix (ASC-H): Principal | ICD-10-CM

## 2019-08-02 DIAGNOSIS — G8929 Other chronic pain: Principal | ICD-10-CM

## 2019-08-02 DIAGNOSIS — M7918 Myalgia, other site: Principal | ICD-10-CM

## 2019-08-02 MED ORDER — TIZANIDINE 2 MG TABLET
ORAL_TABLET | Freq: Three times a day (TID) | ORAL | 5 refills | 10.00000 days | Status: CP | PRN
Start: 2019-08-02 — End: ?

## 2019-08-02 MED ORDER — OXYCODONE 5 MG TABLET
ORAL_TABLET | 0 refills | 0 days | Status: CP
Start: 2019-08-02 — End: ?

## 2019-08-30 DIAGNOSIS — G8929 Other chronic pain: Principal | ICD-10-CM

## 2019-09-03 DIAGNOSIS — G8929 Other chronic pain: Principal | ICD-10-CM

## 2019-09-03 MED ORDER — OXYCODONE 5 MG TABLET
ORAL_TABLET | 0 refills | 0 days | Status: CP
Start: 2019-09-03 — End: ?

## 2019-09-30 DIAGNOSIS — G8929 Other chronic pain: Principal | ICD-10-CM

## 2019-10-05 MED ORDER — NORGESTIMATE 0.25 MG-ETHINYL ESTRADIOL 35 MCG TABLET
ORAL_TABLET | Freq: Every day | ORAL | 3 refills | 84.00000 days | Status: CP
Start: 2019-10-05 — End: 2020-10-04

## 2019-10-06 DIAGNOSIS — G8929 Other chronic pain: Principal | ICD-10-CM

## 2019-10-06 DIAGNOSIS — M3219 Other organ or system involvement in systemic lupus erythematosus: Principal | ICD-10-CM

## 2019-10-06 DIAGNOSIS — M329 Systemic lupus erythematosus, unspecified: Principal | ICD-10-CM

## 2019-10-09 MED ORDER — OXYCODONE 5 MG TABLET
ORAL_TABLET | 0 refills | 0 days | Status: CP
Start: 2019-10-09 — End: ?

## 2019-10-09 MED ORDER — HYDROXYCHLOROQUINE 200 MG TABLET
ORAL_TABLET | Freq: Every day | ORAL | 10 refills | 90.00000 days | Status: CP
Start: 2019-10-09 — End: ?

## 2019-10-31 DIAGNOSIS — G8929 Other chronic pain: Principal | ICD-10-CM

## 2019-11-01 MED ORDER — OXYCODONE 5 MG TABLET
ORAL_TABLET | 0 refills | 0 days | Status: CP
Start: 2019-11-01 — End: ?

## 2019-12-01 DIAGNOSIS — G8929 Other chronic pain: Principal | ICD-10-CM

## 2019-12-05 MED ORDER — OXYCODONE 5 MG TABLET
ORAL_TABLET | 0 refills | 0 days | Status: CP
Start: 2019-12-05 — End: ?

## 2019-12-21 DIAGNOSIS — M3219 Other organ or system involvement in systemic lupus erythematosus: Principal | ICD-10-CM

## 2019-12-21 MED ORDER — TRIAMCINOLONE ACETONIDE 0.1 % TOPICAL OINTMENT
Freq: Two times a day (BID) | TOPICAL | 6 refills | 0.00000 days | Status: CP
Start: 2019-12-21 — End: 2020-12-20

## 2019-12-30 DIAGNOSIS — G8929 Other chronic pain: Principal | ICD-10-CM

## 2019-12-30 MED ORDER — OXYCODONE 5 MG TABLET
ORAL_TABLET | 0 refills | 0 days | Status: CP
Start: 2019-12-30 — End: ?

## 2020-01-26 DIAGNOSIS — M7918 Myalgia, other site: Principal | ICD-10-CM

## 2020-01-26 DIAGNOSIS — R21 Rash and other nonspecific skin eruption: Principal | ICD-10-CM

## 2020-01-27 MED ORDER — TIZANIDINE 2 MG TABLET
ORAL_TABLET | Freq: Three times a day (TID) | ORAL | 5 refills | 10 days | Status: CP | PRN
Start: 2020-01-27 — End: ?

## 2020-01-30 DIAGNOSIS — G8929 Other chronic pain: Principal | ICD-10-CM

## 2020-01-30 MED ORDER — OXYCODONE 5 MG TABLET
ORAL_TABLET | 0 refills | 0 days | Status: CP
Start: 2020-01-30 — End: ?

## 2020-02-27 DIAGNOSIS — G8929 Other chronic pain: Principal | ICD-10-CM

## 2020-02-28 DIAGNOSIS — G8929 Other chronic pain: Principal | ICD-10-CM

## 2020-02-28 MED ORDER — OXYCODONE 5 MG TABLET
ORAL_TABLET | ORAL | 0 refills | 0.00000 days | Status: CP
Start: 2020-02-28 — End: ?

## 2020-03-27 ENCOUNTER — Ambulatory Visit: Admit: 2020-03-27 | Discharge: 2020-03-28 | Payer: PRIVATE HEALTH INSURANCE

## 2020-03-27 ENCOUNTER — Ambulatory Visit: Admit: 2020-03-27 | Discharge: 2020-03-28 | Payer: PRIVATE HEALTH INSURANCE | Attending: Family | Primary: Family

## 2020-03-27 DIAGNOSIS — R05 Cough: Principal | ICD-10-CM

## 2020-03-27 DIAGNOSIS — M329 Systemic lupus erythematosus, unspecified: Principal | ICD-10-CM

## 2020-03-27 DIAGNOSIS — M3219 Other organ or system involvement in systemic lupus erythematosus: Principal | ICD-10-CM

## 2020-03-27 MED ORDER — AZATHIOPRINE 50 MG TABLET
ORAL_TABLET | Freq: Every day | ORAL | 0 refills | 45.00000 days | Status: CP
Start: 2020-03-27 — End: ?

## 2020-03-27 MED ORDER — MAGIC MOUTHWASH (NYSTATIN/DIPHENHYDRAMINE/MYLANTA) WITH LIDOCAINE ORAL MIXTURE
Freq: Four times a day (QID) | ORAL | 2 refills | 0.00000 days | Status: CP | PRN
Start: 2020-03-27 — End: ?

## 2020-03-27 MED ORDER — HYDROXYCHLOROQUINE 200 MG TABLET
ORAL_TABLET | Freq: Every day | ORAL | 10 refills | 60.00000 days | Status: CP
Start: 2020-03-27 — End: ?

## 2020-03-28 DIAGNOSIS — R7989 Other specified abnormal findings of blood chemistry: Principal | ICD-10-CM

## 2020-03-28 DIAGNOSIS — G8929 Other chronic pain: Principal | ICD-10-CM

## 2020-03-28 MED ORDER — PREDNISONE 5 MG TABLET
ORAL_TABLET | ORAL | 0 refills | 20.00000 days | Status: CP
Start: 2020-03-28 — End: 2020-04-17

## 2020-03-29 MED ORDER — OXYCODONE 5 MG TABLET
ORAL_TABLET | ORAL | 0 refills | 0.00000 days | Status: CP
Start: 2020-03-29 — End: ?

## 2020-04-06 ENCOUNTER — Ambulatory Visit
Admit: 2020-04-06 | Discharge: 2020-04-07 | Payer: PRIVATE HEALTH INSURANCE | Attending: Dermatology | Primary: Dermatology

## 2020-04-06 DIAGNOSIS — L659 Nonscarring hair loss, unspecified: Principal | ICD-10-CM

## 2020-04-06 DIAGNOSIS — M329 Systemic lupus erythematosus, unspecified: Principal | ICD-10-CM

## 2020-04-06 MED ORDER — KETOCONAZOLE 2 % SHAMPOO
TOPICAL | 11 refills | 0.00000 days | Status: CP
Start: 2020-04-06 — End: ?

## 2020-04-06 MED ORDER — CLOBETASOL 0.05 % TOPICAL OINTMENT
Freq: Two times a day (BID) | TOPICAL | 11 refills | 0.00000 days | Status: CP
Start: 2020-04-06 — End: 2021-04-06

## 2020-04-13 DIAGNOSIS — M329 Systemic lupus erythematosus, unspecified: Principal | ICD-10-CM

## 2020-04-13 MED ORDER — PREDNISONE 5 MG TABLET
ORAL_TABLET | Freq: Every day | ORAL | 0 refills | 45.00000 days | Status: CP
Start: 2020-04-13 — End: 2020-05-28

## 2020-04-20 ENCOUNTER — Ambulatory Visit: Admit: 2020-04-20 | Discharge: 2020-04-21 | Payer: PRIVATE HEALTH INSURANCE

## 2020-04-20 DIAGNOSIS — R7989 Other specified abnormal findings of blood chemistry: Principal | ICD-10-CM

## 2020-04-20 DIAGNOSIS — M329 Systemic lupus erythematosus, unspecified: Principal | ICD-10-CM

## 2020-04-25 DIAGNOSIS — G8929 Other chronic pain: Principal | ICD-10-CM

## 2020-04-27 MED ORDER — OXYCODONE 5 MG TABLET
ORAL_TABLET | ORAL | 0 refills | 0.00000 days | Status: CP
Start: 2020-04-27 — End: ?

## 2020-05-05 DIAGNOSIS — M329 Systemic lupus erythematosus, unspecified: Principal | ICD-10-CM

## 2020-05-08 MED ORDER — PREDNISONE 5 MG TABLET
ORAL_TABLET | ORAL | 1 refills | 0.00000 days | Status: CP
Start: 2020-05-08 — End: ?

## 2020-05-17 ENCOUNTER — Ambulatory Visit: Admit: 2020-05-17 | Discharge: 2020-05-17 | Payer: PRIVATE HEALTH INSURANCE | Attending: Family | Primary: Family

## 2020-05-17 DIAGNOSIS — M329 Systemic lupus erythematosus, unspecified: Principal | ICD-10-CM

## 2020-05-17 DIAGNOSIS — Z23 Encounter for immunization: Principal | ICD-10-CM

## 2020-05-17 MED ORDER — PREDNISONE 1 MG TABLET
ORAL_TABLET | ORAL | 0 refills | 56.00000 days | Status: CP
Start: 2020-05-17 — End: 2020-07-12

## 2020-05-18 DIAGNOSIS — R7989 Other specified abnormal findings of blood chemistry: Principal | ICD-10-CM

## 2020-05-28 MED ORDER — OXYCODONE 5 MG TABLET
ORAL_TABLET | ORAL | 0 refills | 0.00000 days | Status: CP
Start: 2020-05-28 — End: ?

## 2020-06-01 ENCOUNTER — Ambulatory Visit
Admit: 2020-06-01 | Discharge: 2020-06-02 | Payer: PRIVATE HEALTH INSURANCE | Attending: Retina Specialist | Primary: Retina Specialist

## 2020-06-09 DIAGNOSIS — M329 Systemic lupus erythematosus, unspecified: Principal | ICD-10-CM

## 2020-06-26 ENCOUNTER — Ambulatory Visit
Admit: 2020-06-26 | Discharge: 2020-06-27 | Payer: PRIVATE HEALTH INSURANCE | Attending: Internal Medicine | Primary: Internal Medicine

## 2020-06-26 DIAGNOSIS — L0291 Cutaneous abscess, unspecified: Principal | ICD-10-CM

## 2020-06-26 DIAGNOSIS — M3219 Other organ or system involvement in systemic lupus erythematosus: Principal | ICD-10-CM

## 2020-06-26 MED ORDER — DOXYCYCLINE MONOHYDRATE 100 MG CAPSULE
ORAL_CAPSULE | Freq: Two times a day (BID) | ORAL | 0 refills | 10.00000 days | Status: CP
Start: 2020-06-26 — End: 2020-07-02

## 2020-06-27 DIAGNOSIS — G8929 Other chronic pain: Principal | ICD-10-CM

## 2020-06-28 DIAGNOSIS — G8929 Other chronic pain: Principal | ICD-10-CM

## 2020-07-02 DIAGNOSIS — L0390 Cellulitis, unspecified: Principal | ICD-10-CM

## 2020-07-02 DIAGNOSIS — G8929 Other chronic pain: Principal | ICD-10-CM

## 2020-07-02 MED ORDER — OXYCODONE 5 MG TABLET
ORAL_TABLET | 0 refills | 0 days | Status: CP
Start: 2020-07-02 — End: 2020-07-26

## 2020-07-02 MED ORDER — CEPHALEXIN 500 MG CAPSULE
ORAL_CAPSULE | Freq: Three times a day (TID) | ORAL | 0 refills | 10 days | Status: CP
Start: 2020-07-02 — End: 2020-07-12

## 2020-07-10 ENCOUNTER — Ambulatory Visit: Admit: 2020-07-10 | Discharge: 2020-07-11 | Payer: PRIVATE HEALTH INSURANCE | Attending: Family | Primary: Family

## 2020-07-10 DIAGNOSIS — R0981 Nasal congestion: Principal | ICD-10-CM

## 2020-07-10 DIAGNOSIS — L659 Nonscarring hair loss, unspecified: Principal | ICD-10-CM

## 2020-07-13 DIAGNOSIS — M329 Systemic lupus erythematosus, unspecified: Principal | ICD-10-CM

## 2020-07-26 DIAGNOSIS — G8929 Other chronic pain: Principal | ICD-10-CM

## 2020-07-27 ENCOUNTER — Ambulatory Visit
Admit: 2020-07-27 | Discharge: 2020-07-28 | Payer: PRIVATE HEALTH INSURANCE | Attending: Internal Medicine | Primary: Internal Medicine

## 2020-07-27 DIAGNOSIS — M8580 Other specified disorders of bone density and structure, unspecified site: Principal | ICD-10-CM

## 2020-07-27 DIAGNOSIS — R946 Abnormal results of thyroid function studies: Principal | ICD-10-CM

## 2020-07-27 DIAGNOSIS — Z6823 Body mass index (BMI) 23.0-23.9, adult: Principal | ICD-10-CM

## 2020-07-29 MED ORDER — OXYCODONE 5 MG TABLET
ORAL_TABLET | 0 refills | 0 days | Status: CP
Start: 2020-07-29 — End: 2020-08-28

## 2020-08-06 MED ORDER — PREDNISONE 1 MG TABLET
ORAL_TABLET | Freq: Every day | ORAL | 0 refills | 30.00000 days | Status: CP
Start: 2020-08-06 — End: 2020-08-28

## 2020-08-16 DIAGNOSIS — Z3041 Encounter for surveillance of contraceptive pills: Principal | ICD-10-CM

## 2020-08-16 MED ORDER — NORGESTIMATE 0.25 MG-ETHINYL ESTRADIOL 35 MCG TABLET
ORAL_TABLET | 11 refills | 0.00000 days | Status: CP
Start: 2020-08-16 — End: ?

## 2020-08-17 ENCOUNTER — Ambulatory Visit: Admit: 2020-08-17 | Discharge: 2020-08-17 | Payer: PRIVATE HEALTH INSURANCE

## 2020-08-17 ENCOUNTER — Ambulatory Visit: Admit: 2020-08-17 | Discharge: 2020-08-17 | Payer: PRIVATE HEALTH INSURANCE | Attending: Family | Primary: Family

## 2020-08-17 DIAGNOSIS — M25529 Pain in unspecified elbow: Principal | ICD-10-CM

## 2020-08-17 DIAGNOSIS — M25551 Pain in right hip: Principal | ICD-10-CM

## 2020-08-17 DIAGNOSIS — L0292 Furuncle, unspecified: Principal | ICD-10-CM

## 2020-08-17 DIAGNOSIS — M545 Low back pain, unspecified: Principal | ICD-10-CM

## 2020-08-17 DIAGNOSIS — K760 Fatty (change of) liver, not elsewhere classified: Principal | ICD-10-CM

## 2020-08-17 DIAGNOSIS — Z882 Allergy status to sulfonamides status: Principal | ICD-10-CM

## 2020-08-17 DIAGNOSIS — R7989 Other specified abnormal findings of blood chemistry: Principal | ICD-10-CM

## 2020-08-17 DIAGNOSIS — M329 Systemic lupus erythematosus, unspecified: Principal | ICD-10-CM

## 2020-08-17 DIAGNOSIS — M25519 Pain in unspecified shoulder: Principal | ICD-10-CM

## 2020-08-17 DIAGNOSIS — G8929 Other chronic pain: Principal | ICD-10-CM

## 2020-08-17 DIAGNOSIS — M25559 Pain in unspecified hip: Principal | ICD-10-CM

## 2020-08-27 DIAGNOSIS — G8929 Other chronic pain: Principal | ICD-10-CM

## 2020-08-28 MED ORDER — OXYCODONE 5 MG TABLET
ORAL_TABLET | ORAL | 0 refills | 0.00000 days | Status: CP
Start: 2020-08-28 — End: 2020-09-28

## 2020-08-28 MED ORDER — PREDNISONE 1 MG TABLET
ORAL_TABLET | Freq: Every day | ORAL | 0 refills | 30.00000 days | Status: CP
Start: 2020-08-28 — End: 2021-08-28

## 2020-09-16 DIAGNOSIS — M7918 Myalgia, other site: Principal | ICD-10-CM

## 2020-09-19 MED ORDER — TIZANIDINE 2 MG TABLET
ORAL_TABLET | Freq: Three times a day (TID) | ORAL | 1 refills | 10 days | Status: CP | PRN
Start: 2020-09-19 — End: ?

## 2020-09-26 DIAGNOSIS — G8929 Other chronic pain: Principal | ICD-10-CM

## 2020-09-28 ENCOUNTER — Ambulatory Visit: Admit: 2020-09-28 | Discharge: 2020-09-28 | Payer: PRIVATE HEALTH INSURANCE | Attending: Family | Primary: Family

## 2020-09-28 ENCOUNTER — Ambulatory Visit: Admit: 2020-09-28 | Discharge: 2020-09-28 | Payer: PRIVATE HEALTH INSURANCE

## 2020-09-28 MED ORDER — OXYCODONE 5 MG TABLET
ORAL_TABLET | ORAL | 0 refills | 0.00000 days | Status: CP
Start: 2020-09-28 — End: ?

## 2020-10-25 DIAGNOSIS — G8929 Other chronic pain: Principal | ICD-10-CM

## 2020-10-26 MED ORDER — OXYCODONE 5 MG TABLET
ORAL_TABLET | ORAL | 0 refills | 0.00000 days | Status: CP
Start: 2020-10-26 — End: ?

## 2020-11-06 MED ORDER — TIZANIDINE 2 MG TABLET
ORAL_TABLET | Freq: Three times a day (TID) | ORAL | 1 refills | 10 days | Status: CP | PRN
Start: 2020-11-06 — End: ?

## 2020-11-26 MED ORDER — OXYCODONE 5 MG TABLET
ORAL_TABLET | ORAL | 0 refills | 0.00000 days | Status: CP
Start: 2020-11-26 — End: ?

## 2020-12-07 ENCOUNTER — Ambulatory Visit: Admit: 2020-12-07 | Discharge: 2020-12-07 | Payer: PRIVATE HEALTH INSURANCE

## 2020-12-07 ENCOUNTER — Ambulatory Visit: Admit: 2020-12-07 | Discharge: 2020-12-07 | Payer: PRIVATE HEALTH INSURANCE | Attending: Family | Primary: Family

## 2020-12-07 DIAGNOSIS — M329 Systemic lupus erythematosus, unspecified: Principal | ICD-10-CM

## 2020-12-12 DIAGNOSIS — M329 Systemic lupus erythematosus, unspecified: Principal | ICD-10-CM

## 2020-12-12 MED ORDER — PREDNISONE 5 MG TABLET
ORAL_TABLET | ORAL | 0 refills | 28 days | Status: CP
Start: 2020-12-12 — End: 2021-01-09

## 2020-12-24 DIAGNOSIS — M7918 Myalgia, other site: Principal | ICD-10-CM

## 2020-12-25 DIAGNOSIS — M7918 Myalgia, other site: Principal | ICD-10-CM

## 2020-12-25 MED ORDER — TIZANIDINE 2 MG TABLET
ORAL_TABLET | Freq: Three times a day (TID) | ORAL | 1 refills | 10 days | Status: CP | PRN
Start: 2020-12-25 — End: 2021-01-24

## 2020-12-26 MED ORDER — OXYCODONE 5 MG TABLET
ORAL_TABLET | ORAL | 0 refills | 0.00000 days | Status: CP
Start: 2020-12-26 — End: ?

## 2021-01-15 DIAGNOSIS — M329 Systemic lupus erythematosus, unspecified: Principal | ICD-10-CM

## 2021-01-25 DIAGNOSIS — G8929 Other chronic pain: Principal | ICD-10-CM

## 2021-01-25 MED ORDER — OXYCODONE 5 MG TABLET
ORAL_TABLET | ORAL | 0 refills | 0.00000 days | Status: CP
Start: 2021-01-25 — End: ?

## 2021-01-28 ENCOUNTER — Ambulatory Visit: Admit: 2021-01-28 | Discharge: 2021-01-29 | Payer: PRIVATE HEALTH INSURANCE

## 2021-01-28 DIAGNOSIS — M329 Systemic lupus erythematosus, unspecified: Principal | ICD-10-CM

## 2021-01-28 MED ORDER — PREDNISONE 5 MG TABLET
ORAL_TABLET | ORAL | 0 refills | 28 days | Status: CP
Start: 2021-01-28 — End: 2021-02-25

## 2021-02-11 DIAGNOSIS — M7918 Myalgia, other site: Principal | ICD-10-CM

## 2021-02-15 MED ORDER — TIZANIDINE 2 MG TABLET
ORAL_TABLET | Freq: Three times a day (TID) | ORAL | 1 refills | 10.00000 days | Status: CP | PRN
Start: 2021-02-15 — End: 2021-03-17

## 2021-02-25 DIAGNOSIS — M329 Systemic lupus erythematosus, unspecified: Principal | ICD-10-CM

## 2021-02-25 DIAGNOSIS — G8929 Other chronic pain: Principal | ICD-10-CM

## 2021-02-25 MED ORDER — PREDNISONE 5 MG TABLET
ORAL_TABLET | Freq: Every day | ORAL | 0 refills | 3 days | Status: CP
Start: 2021-02-25 — End: 2021-02-28

## 2021-02-26 ENCOUNTER — Ambulatory Visit
Admit: 2021-02-26 | Discharge: 2021-02-27 | Payer: PRIVATE HEALTH INSURANCE | Attending: Internal Medicine | Primary: Internal Medicine

## 2021-02-26 DIAGNOSIS — G8929 Other chronic pain: Principal | ICD-10-CM

## 2021-02-26 MED ORDER — PREGABALIN 75 MG CAPSULE
ORAL_CAPSULE | 0 refills | 0 days | Status: CP
Start: 2021-02-26 — End: ?

## 2021-02-26 MED ORDER — OXYCODONE 5 MG TABLET
ORAL_TABLET | ORAL | 0 refills | 0.00000 days | Status: CP
Start: 2021-02-26 — End: ?

## 2021-02-28 MED ORDER — PREGABALIN 75 MG CAPSULE
ORAL_CAPSULE | 0 refills | 0 days | Status: CP
Start: 2021-02-28 — End: ?

## 2021-03-16 DIAGNOSIS — M3219 Other organ or system involvement in systemic lupus erythematosus: Principal | ICD-10-CM

## 2021-03-16 MED ORDER — PREDNISONE 5 MG TABLET
ORAL_TABLET | 0 refills | 0 days | Status: CP
Start: 2021-03-16 — End: ?

## 2021-03-28 DIAGNOSIS — G8929 Other chronic pain: Principal | ICD-10-CM

## 2021-03-28 MED ORDER — OXYCODONE 5 MG TABLET
ORAL_TABLET | 0 refills | 0 days | Status: CP
Start: 2021-03-28 — End: ?

## 2021-04-08 MED ORDER — TIZANIDINE 2 MG TABLET
ORAL_TABLET | Freq: Three times a day (TID) | ORAL | 1 refills | 20.00000 days | Status: CP | PRN
Start: 2021-04-08 — End: 2021-05-08

## 2021-04-12 DIAGNOSIS — M3219 Other organ or system involvement in systemic lupus erythematosus: Principal | ICD-10-CM

## 2021-04-12 MED ORDER — PREDNISONE 5 MG TABLET
ORAL_TABLET | 0 refills | 0 days | Status: CP
Start: 2021-04-12 — End: ?

## 2021-04-19 ENCOUNTER — Ambulatory Visit: Admit: 2021-04-19 | Discharge: 2021-04-20 | Payer: PRIVATE HEALTH INSURANCE | Attending: Family | Primary: Family

## 2021-04-19 DIAGNOSIS — R509 Fever, unspecified: Principal | ICD-10-CM

## 2021-04-19 DIAGNOSIS — R109 Unspecified abdominal pain: Principal | ICD-10-CM

## 2021-04-19 MED ORDER — DOXYCYCLINE HYCLATE 100 MG TABLET
ORAL_TABLET | Freq: Two times a day (BID) | ORAL | 0 refills | 7 days | Status: CP
Start: 2021-04-19 — End: 2021-04-26

## 2021-04-25 ENCOUNTER — Ambulatory Visit: Admit: 2021-04-25 | Discharge: 2021-04-26 | Payer: PRIVATE HEALTH INSURANCE

## 2021-04-25 ENCOUNTER — Ambulatory Visit: Admit: 2021-04-25 | Discharge: 2021-04-26 | Payer: PRIVATE HEALTH INSURANCE | Attending: Family | Primary: Family

## 2021-04-25 DIAGNOSIS — R059 Cough: Principal | ICD-10-CM

## 2021-04-25 DIAGNOSIS — R109 Unspecified abdominal pain: Principal | ICD-10-CM

## 2021-04-25 DIAGNOSIS — M329 Systemic lupus erythematosus, unspecified: Principal | ICD-10-CM

## 2021-04-25 MED ORDER — PREDNISONE 1 MG TABLET
ORAL_TABLET | ORAL | 0 refills | 120.00000 days | Status: CP
Start: 2021-04-25 — End: 2021-08-23

## 2021-04-29 DIAGNOSIS — G8929 Other chronic pain: Principal | ICD-10-CM

## 2021-04-29 MED ORDER — OXYCODONE 5 MG TABLET
ORAL_TABLET | 0 refills | 0 days | Status: CP
Start: 2021-04-29 — End: ?

## 2021-05-17 DIAGNOSIS — M7918 Myalgia, other site: Principal | ICD-10-CM

## 2021-05-17 MED ORDER — TIZANIDINE 2 MG TABLET
ORAL_TABLET | Freq: Three times a day (TID) | ORAL | 0 refills | 20.00000 days | Status: CP | PRN
Start: 2021-05-17 — End: 2021-09-14

## 2021-05-28 MED ORDER — OXYCODONE 5 MG TABLET
ORAL_TABLET | ORAL | 0 refills | 0.00000 days | Status: CP
Start: 2021-05-28 — End: ?

## 2021-06-27 DIAGNOSIS — G8929 Other chronic pain: Principal | ICD-10-CM

## 2021-07-01 MED ORDER — OXYCODONE 5 MG TABLET
ORAL_TABLET | 0 refills | 0 days | Status: CP
Start: 2021-07-01 — End: ?

## 2021-07-09 ENCOUNTER — Ambulatory Visit: Admit: 2021-07-09 | Discharge: 2021-07-10 | Payer: PRIVATE HEALTH INSURANCE

## 2021-07-09 ENCOUNTER — Ambulatory Visit
Admit: 2021-07-09 | Discharge: 2021-07-10 | Payer: PRIVATE HEALTH INSURANCE | Attending: Internal Medicine | Primary: Internal Medicine

## 2021-07-09 DIAGNOSIS — M3219 Other organ or system involvement in systemic lupus erythematosus: Principal | ICD-10-CM

## 2021-07-09 DIAGNOSIS — J40 Bronchitis, not specified as acute or chronic: Principal | ICD-10-CM

## 2021-07-09 DIAGNOSIS — R059 Cough, unspecified type: Principal | ICD-10-CM

## 2021-07-09 DIAGNOSIS — M62830 Muscle spasm of back: Principal | ICD-10-CM

## 2021-07-09 MED ORDER — BENZONATATE 200 MG CAPSULE
ORAL_CAPSULE | Freq: Three times a day (TID) | ORAL | 0 refills | 10 days | Status: CP | PRN
Start: 2021-07-09 — End: 2021-07-16

## 2021-07-09 MED ORDER — AZITHROMYCIN 250 MG TABLET
ORAL_TABLET | ORAL | 0 refills | 5.00000 days | Status: CP
Start: 2021-07-09 — End: 2021-07-14

## 2021-07-09 MED ORDER — BACLOFEN 10 MG TABLET
ORAL_TABLET | Freq: Two times a day (BID) | ORAL | 3 refills | 30.00000 days | Status: CP
Start: 2021-07-09 — End: 2022-07-09

## 2021-07-24 DIAGNOSIS — Z3041 Encounter for surveillance of contraceptive pills: Principal | ICD-10-CM

## 2021-07-25 DIAGNOSIS — M7918 Myalgia, other site: Principal | ICD-10-CM

## 2021-07-25 DIAGNOSIS — Z3041 Encounter for surveillance of contraceptive pills: Principal | ICD-10-CM

## 2021-07-25 MED ORDER — TIZANIDINE 2 MG TABLET
ORAL_TABLET | Freq: Three times a day (TID) | ORAL | 0 refills | 20 days | Status: CP | PRN
Start: 2021-07-25 — End: 2021-11-22

## 2021-07-26 DIAGNOSIS — M329 Systemic lupus erythematosus, unspecified: Principal | ICD-10-CM

## 2021-07-26 DIAGNOSIS — Z3041 Encounter for surveillance of contraceptive pills: Principal | ICD-10-CM

## 2021-07-26 MED ORDER — PREDNISONE 1 MG TABLET
ORAL_TABLET | 1 refills | 0 days | Status: CP
Start: 2021-07-26 — End: ?

## 2021-07-27 DIAGNOSIS — G8929 Other chronic pain: Principal | ICD-10-CM

## 2021-07-28 DIAGNOSIS — Z3041 Encounter for surveillance of contraceptive pills: Principal | ICD-10-CM

## 2021-07-29 ENCOUNTER — Ambulatory Visit
Admit: 2021-07-29 | Discharge: 2021-07-30 | Payer: PRIVATE HEALTH INSURANCE | Attending: Retina Specialist | Primary: Retina Specialist

## 2021-07-29 DIAGNOSIS — Z3041 Encounter for surveillance of contraceptive pills: Principal | ICD-10-CM

## 2021-07-29 DIAGNOSIS — H5213 Myopia, bilateral: Principal | ICD-10-CM

## 2021-07-29 DIAGNOSIS — H52203 Unspecified astigmatism, bilateral: Principal | ICD-10-CM

## 2021-07-29 DIAGNOSIS — Z79899 Other long term (current) drug therapy: Principal | ICD-10-CM

## 2021-07-29 DIAGNOSIS — G8929 Other chronic pain: Principal | ICD-10-CM

## 2021-07-29 MED ORDER — NORETHINDRONE ACETATE 1 MG-ETHINYL ESTRADIOL 20 MCG TABLET
ORAL_TABLET | Freq: Every day | ORAL | 3 refills | 84.00000 days | Status: CP
Start: 2021-07-29 — End: 2022-07-29

## 2021-07-30 MED ORDER — OXYCODONE 5 MG TABLET
ORAL_TABLET | 0 refills | 0 days | Status: CP
Start: 2021-07-30 — End: ?

## 2021-08-12 DIAGNOSIS — M329 Systemic lupus erythematosus, unspecified: Principal | ICD-10-CM

## 2021-08-12 DIAGNOSIS — M3219 Other organ or system involvement in systemic lupus erythematosus: Principal | ICD-10-CM

## 2021-08-13 MED ORDER — HYDROXYCHLOROQUINE 200 MG TABLET
ORAL_TABLET | Freq: Every day | ORAL | 3 refills | 90.00000 days | Status: CP
Start: 2021-08-13 — End: 2022-08-13

## 2021-08-27 DIAGNOSIS — G8929 Other chronic pain: Principal | ICD-10-CM

## 2021-09-02 DIAGNOSIS — G8929 Other chronic pain: Principal | ICD-10-CM

## 2021-09-02 MED ORDER — OXYCODONE 5 MG TABLET
ORAL_TABLET | 0 refills | 0 days | Status: CP
Start: 2021-09-02 — End: ?

## 2021-09-07 DIAGNOSIS — M7918 Myalgia, other site: Principal | ICD-10-CM

## 2021-09-09 MED ORDER — TIZANIDINE 2 MG TABLET
ORAL_TABLET | Freq: Three times a day (TID) | ORAL | 0 refills | 20.00000 days | Status: CP | PRN
Start: 2021-09-09 — End: 2022-01-07

## 2021-09-15 DIAGNOSIS — M329 Systemic lupus erythematosus, unspecified: Principal | ICD-10-CM

## 2021-09-16 MED ORDER — PREDNISONE 1 MG TABLET
ORAL_TABLET | ORAL | 1 refills | 0.00000 days | Status: CP
Start: 2021-09-16 — End: ?

## 2021-09-27 DIAGNOSIS — G8929 Other chronic pain: Principal | ICD-10-CM

## 2021-09-27 MED ORDER — OXYCODONE 5 MG TABLET
ORAL_TABLET | 0 refills | 0 days | Status: CP
Start: 2021-09-27 — End: ?

## 2021-10-02 DIAGNOSIS — M7918 Myalgia, other site: Principal | ICD-10-CM

## 2021-10-03 MED ORDER — TIZANIDINE 2 MG TABLET
ORAL_TABLET | Freq: Three times a day (TID) | ORAL | 0 refills | 20 days | Status: CP | PRN
Start: 2021-10-03 — End: 2022-01-31

## 2021-10-26 DIAGNOSIS — M7918 Myalgia, other site: Principal | ICD-10-CM

## 2021-10-29 MED ORDER — OXYCODONE 5 MG TABLET
ORAL_TABLET | 0 refills | 0 days | Status: CP
Start: 2021-10-29 — End: ?

## 2021-10-29 MED ORDER — TIZANIDINE 2 MG TABLET
ORAL_TABLET | Freq: Three times a day (TID) | ORAL | 0 refills | 20 days | Status: CP | PRN
Start: 2021-10-29 — End: 2022-02-26

## 2021-10-30 ENCOUNTER — Ambulatory Visit: Admit: 2021-10-30 | Discharge: 2021-10-31 | Payer: PRIVATE HEALTH INSURANCE

## 2021-10-30 DIAGNOSIS — Z113 Encounter for screening for infections with a predominantly sexual mode of transmission: Principal | ICD-10-CM

## 2021-10-30 DIAGNOSIS — N760 Acute vaginitis: Principal | ICD-10-CM

## 2021-10-30 DIAGNOSIS — N870 Mild cervical dysplasia: Principal | ICD-10-CM

## 2021-10-30 DIAGNOSIS — Z01419 Encounter for gynecological examination (general) (routine) without abnormal findings: Principal | ICD-10-CM

## 2021-11-04 DIAGNOSIS — N760 Acute vaginitis: Principal | ICD-10-CM

## 2021-11-04 DIAGNOSIS — B9689 Other specified bacterial agents as the cause of diseases classified elsewhere: Principal | ICD-10-CM

## 2021-11-04 MED ORDER — METRONIDAZOLE 500 MG TABLET
ORAL_TABLET | Freq: Two times a day (BID) | ORAL | 0 refills | 7.00000 days | Status: CP
Start: 2021-11-04 — End: 2021-11-11

## 2021-11-08 ENCOUNTER — Ambulatory Visit: Admit: 2021-11-08 | Discharge: 2021-11-09 | Payer: PRIVATE HEALTH INSURANCE | Attending: Family | Primary: Family

## 2021-11-08 DIAGNOSIS — M329 Systemic lupus erythematosus, unspecified: Principal | ICD-10-CM

## 2021-11-08 MED ORDER — HYDROCORTISONE 2.5 % LOTION
1 refills | 0 days | Status: CP
Start: 2021-11-08 — End: 2022-11-08

## 2021-11-10 DIAGNOSIS — M329 Systemic lupus erythematosus, unspecified: Principal | ICD-10-CM

## 2021-11-11 MED ORDER — PREDNISONE 1 MG TABLET
ORAL_TABLET | 1 refills | 0 days | Status: CP
Start: 2021-11-11 — End: ?

## 2021-11-13 DIAGNOSIS — M7918 Myalgia, other site: Principal | ICD-10-CM

## 2021-11-13 MED ORDER — TIZANIDINE 2 MG TABLET
ORAL_TABLET | Freq: Three times a day (TID) | ORAL | 0 refills | 20 days | Status: CP | PRN
Start: 2021-11-13 — End: 2022-03-13

## 2021-11-26 DIAGNOSIS — M328 Other forms of systemic lupus erythematosus: Principal | ICD-10-CM

## 2021-11-26 DIAGNOSIS — R7401 Transaminitis: Principal | ICD-10-CM

## 2021-12-02 DIAGNOSIS — G8929 Other chronic pain: Principal | ICD-10-CM

## 2021-12-02 MED ORDER — OXYCODONE 5 MG TABLET
ORAL_TABLET | Freq: Every day | ORAL | 0 refills | 30 days | Status: CP | PRN
Start: 2021-12-02 — End: 2022-01-01

## 2021-12-07 DIAGNOSIS — M7918 Myalgia, other site: Principal | ICD-10-CM

## 2021-12-09 MED ORDER — TIZANIDINE 2 MG TABLET
ORAL_TABLET | Freq: Three times a day (TID) | ORAL | 0 refills | 20 days | Status: CP | PRN
Start: 2021-12-09 — End: 2022-04-08

## 2021-12-27 DIAGNOSIS — G8929 Other chronic pain: Principal | ICD-10-CM

## 2021-12-27 MED ORDER — OXYCODONE 5 MG TABLET
ORAL_TABLET | Freq: Every day | ORAL | 0 refills | 30 days | Status: CP | PRN
Start: 2021-12-27 — End: 2022-01-26

## 2022-01-15 DIAGNOSIS — M329 Systemic lupus erythematosus, unspecified: Principal | ICD-10-CM

## 2022-01-16 DIAGNOSIS — M329 Systemic lupus erythematosus, unspecified: Principal | ICD-10-CM

## 2022-01-20 DIAGNOSIS — M329 Systemic lupus erythematosus, unspecified: Principal | ICD-10-CM

## 2022-01-21 DIAGNOSIS — M329 Systemic lupus erythematosus, unspecified: Principal | ICD-10-CM

## 2022-01-23 MED ORDER — PREDNISONE 1 MG TABLET
ORAL_TABLET | 1 refills | 0 days | Status: CP
Start: 2022-01-23 — End: ?

## 2022-01-26 DIAGNOSIS — G8929 Other chronic pain: Principal | ICD-10-CM

## 2022-01-28 MED ORDER — OXYCODONE 5 MG TABLET
ORAL_TABLET | Freq: Every day | ORAL | 0 refills | 30 days | Status: CP | PRN
Start: 2022-01-28 — End: 2022-02-27

## 2022-01-30 ENCOUNTER — Ambulatory Visit
Admit: 2022-01-30 | Discharge: 2022-01-31 | Payer: PRIVATE HEALTH INSURANCE | Attending: Women's Health | Primary: Women's Health

## 2022-01-30 DIAGNOSIS — N879 Dysplasia of cervix uteri, unspecified: Principal | ICD-10-CM

## 2022-01-30 DIAGNOSIS — B3731 Yeast vaginitis: Principal | ICD-10-CM

## 2022-02-03 MED ORDER — FLUCONAZOLE 150 MG TABLET
ORAL_TABLET | ORAL | 0 refills | 0.00000 days | Status: CP
Start: 2022-02-03 — End: ?

## 2022-02-08 DIAGNOSIS — M7918 Myalgia, other site: Principal | ICD-10-CM

## 2022-02-11 MED ORDER — TIZANIDINE 2 MG TABLET
ORAL_TABLET | Freq: Three times a day (TID) | ORAL | 0 refills | 20 days | Status: CP | PRN
Start: 2022-02-11 — End: 2022-06-11

## 2022-02-21 ENCOUNTER — Ambulatory Visit
Admit: 2022-02-21 | Discharge: 2022-02-26 | Disposition: A | Discharge status: Home / Self Care | Payer: PRIVATE HEALTH INSURANCE | Admitting: Student in an Organized Health Care Education/Training Program

## 2022-02-21 ENCOUNTER — Ambulatory Visit: Admit: 2022-02-21 | Discharge: 2022-02-26 | Payer: PRIVATE HEALTH INSURANCE

## 2022-02-21 ENCOUNTER — Encounter: Admit: 2022-02-21 | Discharge: 2022-02-26 | Payer: PRIVATE HEALTH INSURANCE

## 2022-02-26 DIAGNOSIS — G8929 Other chronic pain: Principal | ICD-10-CM

## 2022-02-26 DIAGNOSIS — M3212 Pericarditis in systemic lupus erythematosus: Principal | ICD-10-CM

## 2022-02-27 ENCOUNTER — Ambulatory Visit: Admit: 2022-02-27 | Discharge: 2022-02-28 | Payer: PRIVATE HEALTH INSURANCE

## 2022-02-27 DIAGNOSIS — Z09 Encounter for follow-up examination after completed treatment for conditions other than malignant neoplasm: Principal | ICD-10-CM

## 2022-02-27 DIAGNOSIS — G8929 Other chronic pain: Principal | ICD-10-CM

## 2022-02-27 MED ORDER — PREDNISONE 20 MG TABLET
ORAL_TABLET | ORAL | 0 refills | 43 days | Status: CP
Start: 2022-02-27 — End: 2022-04-11

## 2022-02-27 MED ORDER — COLCHICINE 0.6 MG TABLET
ORAL_TABLET | ORAL | 0 refills | 11 days | Status: CP
Start: 2022-02-27 — End: 2022-03-10

## 2022-02-28 MED ORDER — OXYCODONE 5 MG TABLET
ORAL_TABLET | Freq: Every day | ORAL | 0 refills | 30 days | Status: CP | PRN
Start: 2022-02-28 — End: 2022-03-30

## 2022-03-11 ENCOUNTER — Ambulatory Visit: Admit: 2022-03-11 | Discharge: 2022-03-12 | Payer: PRIVATE HEALTH INSURANCE

## 2022-03-18 ENCOUNTER — Ambulatory Visit
Admit: 2022-03-18 | Discharge: 2022-03-19 | Payer: PRIVATE HEALTH INSURANCE | Attending: Student in an Organized Health Care Education/Training Program | Primary: Student in an Organized Health Care Education/Training Program

## 2022-03-18 DIAGNOSIS — R079 Chest pain, unspecified: Principal | ICD-10-CM

## 2022-03-18 DIAGNOSIS — M3212 Pericarditis in systemic lupus erythematosus: Principal | ICD-10-CM

## 2022-03-18 DIAGNOSIS — M329 Systemic lupus erythematosus, unspecified: Principal | ICD-10-CM

## 2022-03-18 DIAGNOSIS — I319 Disease of pericardium, unspecified: Principal | ICD-10-CM

## 2022-03-27 ENCOUNTER — Ambulatory Visit: Admit: 2022-03-27 | Discharge: 2022-03-28 | Payer: PRIVATE HEALTH INSURANCE | Attending: Family | Primary: Family

## 2022-03-27 DIAGNOSIS — G8929 Other chronic pain: Principal | ICD-10-CM

## 2022-03-27 DIAGNOSIS — K130 Diseases of lips: Principal | ICD-10-CM

## 2022-03-27 DIAGNOSIS — M329 Systemic lupus erythematosus, unspecified: Principal | ICD-10-CM

## 2022-03-27 MED ORDER — PREDNISONE 5 MG TABLET
ORAL_TABLET | ORAL | 0 refills | 42 days | Status: CP
Start: 2022-03-27 — End: 2022-05-08

## 2022-03-27 MED ORDER — CLOTRIMAZOLE 1 % TOPICAL CREAM
Freq: Two times a day (BID) | TOPICAL | 0 refills | 14 days | Status: CP
Start: 2022-03-27 — End: 2022-04-10

## 2022-03-29 MED ORDER — OXYCODONE 5 MG TABLET
ORAL_TABLET | Freq: Every day | ORAL | 0 refills | 30 days | Status: CP | PRN
Start: 2022-03-29 — End: 2022-04-28

## 2022-04-07 ENCOUNTER — Institutional Professional Consult (permissible substitution): Admit: 2022-04-07 | Discharge: 2022-04-08 | Payer: PRIVATE HEALTH INSURANCE

## 2022-04-07 DIAGNOSIS — M3212 Pericarditis in systemic lupus erythematosus: Principal | ICD-10-CM

## 2022-04-07 DIAGNOSIS — Z09 Encounter for follow-up examination after completed treatment for conditions other than malignant neoplasm: Principal | ICD-10-CM

## 2022-04-24 DIAGNOSIS — M329 Systemic lupus erythematosus, unspecified: Principal | ICD-10-CM

## 2022-04-24 MED ORDER — PREDNISONE 1 MG TABLET
ORAL_TABLET | Freq: Every day | ORAL | 0 refills | 90 days | Status: CP
Start: 2022-04-24 — End: 2023-04-24

## 2022-04-28 DIAGNOSIS — G8929 Other chronic pain: Principal | ICD-10-CM

## 2022-04-28 MED ORDER — PREDNISONE 5 MG TABLET
ORAL_TABLET | ORAL | 0 refills | 28 days | Status: CP
Start: 2022-04-28 — End: 2022-05-26

## 2022-04-28 MED ORDER — OXYCODONE 5 MG TABLET
ORAL_TABLET | Freq: Every day | ORAL | 0 refills | 30 days | Status: CP | PRN
Start: 2022-04-28 — End: 2022-05-28

## 2022-04-30 DIAGNOSIS — M7918 Myalgia, other site: Principal | ICD-10-CM

## 2022-05-01 MED ORDER — TIZANIDINE 2 MG TABLET
ORAL_TABLET | Freq: Three times a day (TID) | ORAL | 0 refills | 20 days | Status: CP | PRN
Start: 2022-05-01 — End: 2022-08-29

## 2022-05-20 ENCOUNTER — Ambulatory Visit
Admit: 2022-05-20 | Discharge: 2022-05-24 | Disposition: A | Discharge status: Home Health | Payer: PRIVATE HEALTH INSURANCE | Admitting: Student in an Organized Health Care Education/Training Program

## 2022-05-22 DIAGNOSIS — M329 Systemic lupus erythematosus, unspecified: Principal | ICD-10-CM

## 2022-05-23 MED ORDER — APIXABAN 5 MG TABLET
ORAL_TABLET | Freq: Two times a day (BID) | ORAL | 0 refills | 30 days
Start: 2022-05-23 — End: 2022-05-23

## 2022-05-24 MED ORDER — OXYCODONE 5 MG TABLET
ORAL_TABLET | ORAL | 0 refills | 2 days | Status: CP | PRN
Start: 2022-05-24 — End: 2022-05-29

## 2022-05-24 MED ORDER — APIXABAN 5 MG TABLET
ORAL_TABLET | Freq: Two times a day (BID) | ORAL | 0 refills | 4 days | Status: CP
Start: 2022-05-24 — End: 2022-05-28

## 2022-05-24 MED ORDER — MYCOPHENOLATE MOFETIL 500 MG TABLET
ORAL_TABLET | Freq: Two times a day (BID) | ORAL | 11 refills | 30 days | Status: CP
Start: 2022-05-24 — End: 2023-05-24

## 2022-05-25 MED ORDER — PREDNISONE 20 MG TABLET
ORAL_TABLET | ORAL | 0 refills | 40 days | Status: CP
Start: 2022-05-25 — End: 2022-07-04

## 2022-05-27 ENCOUNTER — Ambulatory Visit
Admit: 2022-05-27 | Discharge: 2022-05-28 | Payer: PRIVATE HEALTH INSURANCE | Attending: Internal Medicine | Primary: Internal Medicine

## 2022-05-27 DIAGNOSIS — R21 Rash and other nonspecific skin eruption: Principal | ICD-10-CM

## 2022-05-27 DIAGNOSIS — M3219 Other organ or system involvement in systemic lupus erythematosus: Principal | ICD-10-CM

## 2022-05-27 DIAGNOSIS — L299 Pruritus, unspecified: Principal | ICD-10-CM

## 2022-05-27 MED ORDER — MYCOPHENOLATE MOFETIL 500 MG TABLET
ORAL_TABLET | Freq: Two times a day (BID) | ORAL | 6 refills | 30 days | Status: CP
Start: 2022-05-27 — End: 2023-05-27

## 2022-05-27 MED ORDER — HYDROXYZINE HCL 25 MG TABLET
ORAL_TABLET | Freq: Three times a day (TID) | ORAL | 1 refills | 10 days | Status: CP | PRN
Start: 2022-05-27 — End: ?

## 2022-05-28 MED ORDER — APIXABAN 5 MG TABLET
ORAL_TABLET | Freq: Two times a day (BID) | ORAL | 2 refills | 30 days | Status: CP
Start: 2022-05-28 — End: 2022-08-26

## 2022-05-29 MED ORDER — OXYCODONE 5 MG TABLET
ORAL_TABLET | Freq: Every day | ORAL | 0 refills | 30 days | Status: CP
Start: 2022-05-29 — End: ?

## 2022-06-03 DIAGNOSIS — M329 Systemic lupus erythematosus, unspecified: Principal | ICD-10-CM

## 2022-06-03 DIAGNOSIS — M79606 Pain in leg, unspecified: Principal | ICD-10-CM

## 2022-06-03 MED ORDER — MISCELLANEOUS MEDICAL SUPPLY MISC
0 refills | 0 days | Status: CP
Start: 2022-06-03 — End: ?

## 2022-06-04 ENCOUNTER — Ambulatory Visit
Admit: 2022-06-04 | Discharge: 2022-06-05 | Payer: PRIVATE HEALTH INSURANCE | Attending: Student in an Organized Health Care Education/Training Program | Primary: Student in an Organized Health Care Education/Training Program

## 2022-06-04 DIAGNOSIS — L989 Disorder of the skin and subcutaneous tissue, unspecified: Principal | ICD-10-CM

## 2022-06-04 DIAGNOSIS — D72829 Elevated white blood cell count, unspecified: Principal | ICD-10-CM

## 2022-06-04 DIAGNOSIS — Z09 Encounter for follow-up examination after completed treatment for conditions other than malignant neoplasm: Principal | ICD-10-CM

## 2022-06-10 ENCOUNTER — Ambulatory Visit
Admit: 2022-06-10 | Payer: PRIVATE HEALTH INSURANCE | Attending: Rehabilitative and Restorative Service Providers" | Primary: Rehabilitative and Restorative Service Providers"

## 2022-06-11 ENCOUNTER — Ambulatory Visit
Admit: 2022-06-11 | Discharge: 2022-06-12 | Payer: PRIVATE HEALTH INSURANCE | Attending: Hematology | Primary: Hematology

## 2022-06-11 DIAGNOSIS — M329 Systemic lupus erythematosus, unspecified: Principal | ICD-10-CM

## 2022-06-11 DIAGNOSIS — I999 Unspecified disorder of circulatory system: Principal | ICD-10-CM

## 2022-06-11 DIAGNOSIS — D709 Neutropenia, unspecified: Principal | ICD-10-CM

## 2022-06-11 MED ORDER — APIXABAN 5 MG TABLET
ORAL_TABLET | Freq: Two times a day (BID) | ORAL | 3 refills | 90 days | Status: CP
Start: 2022-06-11 — End: 2023-06-11

## 2022-06-20 ENCOUNTER — Ambulatory Visit
Admit: 2022-06-20 | Discharge: 2022-06-21 | Payer: PRIVATE HEALTH INSURANCE | Attending: Student in an Organized Health Care Education/Training Program | Primary: Student in an Organized Health Care Education/Training Program

## 2022-06-20 DIAGNOSIS — L989 Disorder of the skin and subcutaneous tissue, unspecified: Principal | ICD-10-CM

## 2022-06-20 MED ORDER — AMOXICILLIN 875 MG TABLET
ORAL_TABLET | Freq: Two times a day (BID) | ORAL | 0 refills | 5 days | Status: CP
Start: 2022-06-20 — End: 2022-06-25

## 2022-06-23 DIAGNOSIS — A4902 Methicillin resistant Staphylococcus aureus infection, unspecified site: Principal | ICD-10-CM

## 2022-06-23 MED ORDER — PROBIOTIC 3 BILLION CELL CAPSULE
ORAL_CAPSULE | Freq: Every day | ORAL | 0 refills | 5 days | Status: CP
Start: 2022-06-23 — End: 2022-06-28

## 2022-06-23 MED ORDER — CLINDAMYCIN HCL 300 MG CAPSULE
ORAL_CAPSULE | Freq: Four times a day (QID) | ORAL | 0 refills | 5 days | Status: CP
Start: 2022-06-23 — End: 2022-06-28

## 2022-06-27 DIAGNOSIS — M7918 Myalgia, other site: Principal | ICD-10-CM

## 2022-06-27 DIAGNOSIS — M329 Systemic lupus erythematosus, unspecified: Principal | ICD-10-CM

## 2022-06-27 MED ORDER — OXYCODONE 5 MG TABLET
ORAL_TABLET | Freq: Every day | ORAL | 0 refills | 30 days | Status: CP
Start: 2022-06-27 — End: ?

## 2022-06-30 MED ORDER — TIZANIDINE 2 MG TABLET
ORAL_TABLET | Freq: Three times a day (TID) | ORAL | 0 refills | 20 days | Status: CP | PRN
Start: 2022-06-30 — End: 2022-10-28

## 2022-06-30 MED ORDER — PREDNISONE 5 MG TABLET
ORAL_TABLET | 0 refills | 0 days | Status: CP
Start: 2022-06-30 — End: ?

## 2022-07-01 DIAGNOSIS — L299 Pruritus, unspecified: Principal | ICD-10-CM

## 2022-07-01 MED ORDER — HYDROXYZINE HCL 25 MG TABLET
ORAL_TABLET | Freq: Three times a day (TID) | ORAL | 1 refills | 10 days | Status: CP | PRN
Start: 2022-07-01 — End: ?

## 2022-07-11 ENCOUNTER — Ambulatory Visit: Admit: 2022-07-11 | Discharge: 2022-07-12 | Payer: PRIVATE HEALTH INSURANCE | Attending: Family | Primary: Family

## 2022-07-11 DIAGNOSIS — R030 Elevated blood-pressure reading, without diagnosis of hypertension: Principal | ICD-10-CM

## 2022-07-11 DIAGNOSIS — R1319 Other dysphagia: Principal | ICD-10-CM

## 2022-07-11 MED ORDER — OMEPRAZOLE 20 MG CAPSULE,DELAYED RELEASE
ORAL_CAPSULE | Freq: Every day | ORAL | 3 refills | 90 days | Status: CP
Start: 2022-07-11 — End: 2023-07-11

## 2022-07-11 MED ORDER — LOSARTAN 25 MG TABLET
ORAL_TABLET | Freq: Every day | ORAL | 3 refills | 90 days | Status: CP
Start: 2022-07-11 — End: 2023-07-11

## 2022-07-11 NOTE — Unmapped (Addendum)
Assessment and Plan:      Diagnosis ICD-10-CM Associated Orders   1. Esophageal dysphagia  R13.19 Symptoms concerning for GERD. Recommend omeprazole 20 mg daily and reviewed GERD precautions. Will proceed with swallow study.     omeprazole (PRILOSEC) 20 MG capsule     Amb referral to Speech Therapy     FL Barium Swallow Modified      2. Elevated blood pressure reading in office without diagnosis of hypertension  R03.0 BP not well controlled. Discussed medication options. Initiate losartan 25 mg daily. Reviewed low sodium diet and encouraged regular exercise. Advised to continue to monitor and log at-home BP readings. Contact clinic if readings become elevated.      losartan (COZAAR) 25 MG tablet          I personally spent 30 minutes face-to-face and non-face-to-face in the care of this patient, which includes all pre, intra, and post visit time on the date of service.    Return in about 4 weeks (around 08/08/2022) for Next scheduled follow up, BP recheck .    HPI:      Julia Bates is here for   Chief Complaint   Patient presents with    Food gets stuck     Food feels like it gets stuck and just sits in her esophagus.  Tums not helping.  Has acid reflux.  Rheumatologist decreased Cellcept and also had to stop x 1 week due to MRSA.  No change in sx.    Immunizations     Declines flu and Prevnar 20 shots.    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 She describes sensation of food getting stuck in her lower esophagus. Symptoms present for approximately one month, occurring daily, every time she eats. No specific food triggers, happens with any type of food, does not occur when she drinks liquids. Waking at night with urge to vomit but has not vomited. No trauma or foreign body injury.   Has tried Tums, Gaviscon.      Elevated Blood Pressure: Patient presents for follow-up of hypertension. Blood pressure goal < 140/90.  Hypertension has customarily not been at goal complicated by autoimmune disease, recent infection.  Home blood pressure readings: Home BP measurements labile, high and low. Mostly high, gives examples of 156/118, 164/116. Salt intake and diet: salt not added to cooking. Associated signs and symptoms: none. Patient denies: blurred vision, chest pain, dyspnea, headache, neck aches, orthopnea, palpitations, paroxysmal nocturnal dyspnea, peripheral edema, pulsating in the ears, and tiredness/fatigue. Medication compliance: not currently on medication for this condition. She is not doing regular exercise.           ROS:      Comprehensive 10 point ROS negative unless otherwise stated in the HPI.      [facility] Components:     Medication adherence and barriers to the treatment plan have been addressed. Opportunities to optimize healthy behaviors have been discussed. Patient / caregiver voiced understanding.    Past Medical/Surgical History:     Past Medical History:   Diagnosis Date    Abnormal Pap smear of cervix     Allergic  Sulfa    Cataract     Dysplasia of cervix, 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    ECC pos CIN 1--12/19    GERD (gastroesophageal reflux disease) Oct 23    HPV (human papilloma virus) infection     Lupus (CMS-HCC)     Lupus (CMS-HCC)     Rheumatoid arthritis (CMS-HCC) 11/18/2015    SLE (systemic lupus erythematosus related syndrome) (CMS-HCC)      Past Surgical History:   Procedure Laterality Date    CERVICAL BIOPSY  W/ LOOP ELECTRODE EXCISION      COLPOSCOPY      SKIN BIOPSY  2013-2014       Family History:     Family History   Problem Relation Age of Onset    Glaucoma Father     Diabetes Father Hypertension Father     No Known Problems Mother     No Known Problems Sister     Diabetes Sister     Diabetes Sister     Melanoma Neg Hx     Basal cell carcinoma Neg Hx     Squamous cell carcinoma Neg Hx     Macular degeneration Neg Hx     Blindness Neg Hx        Social History:     Social History     Tobacco Use    Smoking status: Never    Smokeless tobacco: Never    Tobacco comments:     None   Vaping Use    Vaping Use: Never used   Substance Use Topics    Alcohol use: Never    Drug use: Never       Allergies:     Sulfa (sulfonamide antibiotics)    Current Medications:     Current Outpatient Medications   Medication Sig Dispense Refill    apixaban (ELIQUIS) 5 mg Tab Take 1 tablet (5 mg total) by mouth two (2) times a day. 180 tablet 3    calcium carbonate-vitamin D3 600 mg(1,500mg ) -200 unit per tablet Take 1 tablet by mouth Two (2) times a day.      hydrOXYchloroQUINE (PLAQUENIL) 200 mg tablet Take 1 tablet (200 mg total) by mouth daily. 90 tablet 3    hydrOXYzine (ATARAX) 25 MG tablet TAKE 1 TABLET (25 MG TOTAL) BY MOUTH EVERY EIGHT (8) HOURS AS NEEDED. 30 tablet 1    lactobacillus combination no.4 (PROBIOTIC) 3 billion cell cap Take 1 capsule by mouth in the morning for 5 days. Take with clindamycin antibiotic. 5 capsule 0    melatonin 5 mg tablet Take 1 tablet (5 mg total) by mouth nightly.      miscellaneous medical supply Misc Pt needs a set of crutches. 1 each 0    mycophenolate (CELLCEPT) 500 mg tablet Take 2 tablets (1,000 mg total) by mouth two (2) times a day. 120 tablet 6    norethindrone-ethinyl estradiol (MICROGESTIN 1/20) 1-20 mg-mcg per tablet Take 1 tablet by mouth daily. 84 tablet 3    oxyCODONE (ROXICODONE) 5 MG immediate release tablet Take 1 tablet (5 mg total) by mouth in the morning. 30 tablet 0    predniSONE (DELTASONE) 5 MG tablet Taper by 10mg  every week as previously planned. 60 tablet 0    hydrocortisone 2.5 % lotion Apply 1 application. topically two (2) times a day as needed. Apply to affected area 2 times daily as needed      tizanidine (ZANAFLEX) 2 MG tablet TAKE 1 TABLET (2 MG TOTAL)  BY MOUTH EVERY EIGHT (8) HOURS AS NEEDED. 60 tablet 0     No current facility-administered medications for this visit.       Health Maintenance:     Health Maintenance   Topic Date Due    Pneumococcal Vaccine 0-64 (3 - PPSV23 or PCV20) 08/06/2020    COVID-19 Vaccine (3 - Pfizer risk series) 10/27/2020    Influenza Vaccine (1) 04/11/2022    DTaP/Tdap/Td Vaccines (4 - Td or Tdap) 04/09/2025    HPV Cotest with Pap Smear (21-65)  11/03/2026    Pap Smear with Cotest HPV (21-65)  11/13/2026    Hepatitis C Screen  Completed       Immunizations:     Immunization History   Administered Date(s) Administered    COVID-19 VAC,MRNA,TRIS(12Y UP)(PFIZER)(GRAY CAP) 09/08/2020    COVID-19 VACC,MRNA,(PFIZER)(PF) 09/29/2020    DTP 12/08/1990    HPV Quadrivalent (Gardasil) 03/24/2006, 06/01/2006, 10/02/2006    Influenza Vaccine Quad (IIV4 PF) 33mo+ injectable 06/05/2015, 05/06/2016, 07/07/2017, 07/06/2018, 04/29/2019, 05/17/2020    Influenza Virus Vaccine, unspecified formulation 05/06/2016, 07/07/2017    MMR 12/08/1990    Meningococcal C Conjugate 03/08/2004    Novel Influenza-h1n1-09, All Formulations 07/20/2008    PNEUMOCOCCAL POLYSACCHARIDE 23-VALENT 08/07/2015    PPD Test 05/20/2009    Pneumococcal Conjugate 13-Valent 04/10/2015    SHINGRIX-ZOSTER VACCINE (HZV), RECOMBINANT,SUB-UNIT,ADJUVANTED IM 12/04/2021, 07/05/2022    TdaP 02/16/2012, 04/10/2015     I have reviewed and (if needed) updated the patient's problem list, medications, allergies, past medical and surgical history, social and family history.     Vital Signs:     Wt Readings from Last 3 Encounters:   07/11/22 61.7 kg (136 lb)   06/20/22 58.8 kg (129 lb 9.6 oz)   06/11/22 59.5 kg (131 lb 3.2 oz)     Temp Readings from Last 3 Encounters:   07/11/22 37.1 ??C (98.8 ??F) (Oral)   06/11/22 36.9 ??C (98.4 ??F)   06/04/22 36.4 ??C (97.5 ??F) (Temporal)     BP Readings from Last 3 Encounters:   07/11/22 150/106   06/20/22 124/80   06/11/22 148/94     Pulse Readings from Last 3 Encounters:   07/11/22 95   06/11/22 92   06/04/22 70     Estimated body mass index is 27.47 kg/m?? as calculated from the following:    Height as of this encounter: 149.9 cm (4' 11).    Weight as of this encounter: 61.7 kg (136 lb).  Facility age limit for growth %iles is 20 years.      Objective:      General: Alert and oriented x3. Well-appearing. No acute distress.   HEENT:  Normocephalic.  Atraumatic. Conjunctiva and sclera normal. OP MMM without lesions. No pharyngeal erythema or exudate. Tonsils normal.    Neck:  Supple. No thyroid enlargement. No adenopathy.   Heart:  Regular rate and rhythm. Normal S1, S2. No murmurs, rubs or gallops.   Lungs:  No respiratory distress.  Lungs clear to auscultation. No wheezes, rhonchi, or rales.   GI/GU:  Soft, +BS, nondistended, non-TTP. No palpable masses or organomegaly.   Extremities:  No edema. Peripheral pulses normal.   Skin:  Warm, dry. Left medial thigh, site of punch biopsy, closed with scabbing and without drainage. No erythema.   Neuro:  Non-focal. No obvious weakness.   Psych:  Affect normal, eye contact good, speech clear and coherent.       Noralyn Pick, FNP

## 2022-07-21 DIAGNOSIS — M549 Dorsalgia, unspecified: Principal | ICD-10-CM

## 2022-07-21 DIAGNOSIS — M7918 Myalgia, other site: Principal | ICD-10-CM

## 2022-07-22 DIAGNOSIS — L299 Pruritus, unspecified: Principal | ICD-10-CM

## 2022-07-22 MED ORDER — TIZANIDINE 2 MG TABLET
ORAL_TABLET | Freq: Three times a day (TID) | ORAL | 0 refills | 20 days | Status: CP | PRN
Start: 2022-07-22 — End: 2022-11-19

## 2022-07-22 NOTE — Unmapped (Signed)
Atarax refill  Last Visit Date: 05/27/2022  Next Visit Date: 07/25/2022    Lab Results   Component Value Date    ALT 17 05/20/2022    AST 22 05/20/2022    ALBUMIN 3.9 05/20/2022    CREATININE 0.69 05/24/2022     Lab Results   Component Value Date    WBC 15.6 (H) 06/11/2022    HGB 14.1 06/11/2022    HCT 43.7 06/11/2022    PLT 249 06/11/2022     Lab Results   Component Value Date    NEUTROPCT 89.3 06/11/2022    LYMPHOPCT 8.6 06/11/2022    MONOPCT 1.7 06/11/2022    EOSPCT 0.0 06/11/2022    BASOPCT 0.4 06/11/2022

## 2022-07-24 DIAGNOSIS — L299 Pruritus, unspecified: Principal | ICD-10-CM

## 2022-07-25 ENCOUNTER — Ambulatory Visit: Admit: 2022-07-25 | Discharge: 2022-07-25 | Payer: PRIVATE HEALTH INSURANCE

## 2022-07-25 ENCOUNTER — Ambulatory Visit: Admit: 2022-07-25 | Discharge: 2022-07-25 | Payer: PRIVATE HEALTH INSURANCE | Attending: Family | Primary: Family

## 2022-07-25 DIAGNOSIS — G8929 Other chronic pain: Principal | ICD-10-CM

## 2022-07-25 DIAGNOSIS — R0981 Nasal congestion: Principal | ICD-10-CM

## 2022-07-25 DIAGNOSIS — M3219 Other organ or system involvement in systemic lupus erythematosus: Principal | ICD-10-CM

## 2022-07-25 MED ORDER — OXYCODONE 5 MG TABLET
ORAL_TABLET | Freq: Every day | ORAL | 0 refills | 30 days | Status: CP
Start: 2022-07-25 — End: ?

## 2022-07-25 MED ORDER — HYDROXYZINE HCL 25 MG TABLET
ORAL_TABLET | Freq: Three times a day (TID) | ORAL | 1 refills | 10 days | Status: CP | PRN
Start: 2022-07-25 — End: ?

## 2022-07-28 MED ORDER — HYDROXYZINE HCL 25 MG TABLET
ORAL_TABLET | Freq: Three times a day (TID) | ORAL | 1 refills | 10 days | Status: CP | PRN
Start: 2022-07-28 — End: ?

## 2022-07-29 ENCOUNTER — Ambulatory Visit
Admit: 2022-07-29 | Discharge: 2022-07-30 | Payer: PRIVATE HEALTH INSURANCE | Attending: Student in an Organized Health Care Education/Training Program | Primary: Student in an Organized Health Care Education/Training Program

## 2022-07-29 DIAGNOSIS — R051 Acute cough: Principal | ICD-10-CM

## 2022-07-30 ENCOUNTER — Ambulatory Visit: Admit: 2022-07-30 | Discharge: 2022-07-31 | Payer: PRIVATE HEALTH INSURANCE

## 2022-07-30 DIAGNOSIS — Z3041 Encounter for surveillance of contraceptive pills: Principal | ICD-10-CM

## 2022-07-30 MED ORDER — NORETHINDRONE ACETATE 1 MG-ETHINYL ESTRADIOL 20 MCG TABLET
ORAL_TABLET | Freq: Every day | ORAL | 0 refills | 84.00000 days | Status: CP
Start: 2022-07-30 — End: 2023-07-30

## 2022-07-31 DIAGNOSIS — M3219 Other organ or system involvement in systemic lupus erythematosus: Principal | ICD-10-CM

## 2022-07-31 DIAGNOSIS — M533 Sacrococcygeal disorders, not elsewhere classified: Principal | ICD-10-CM

## 2022-08-07 ENCOUNTER — Ambulatory Visit: Admit: 2022-08-07 | Discharge: 2022-08-08

## 2022-08-12 DIAGNOSIS — M3219 Other organ or system involvement in systemic lupus erythematosus: Principal | ICD-10-CM

## 2022-08-12 MED ORDER — HYDROXYCHLOROQUINE 200 MG TABLET
ORAL_TABLET | Freq: Every day | ORAL | 11 refills | 30 days | Status: CP
Start: 2022-08-12 — End: 2023-08-12
  Filled 2022-08-14: qty 45, 30d supply, fill #0

## 2022-08-12 MED ORDER — MYCOPHENOLATE SODIUM 360 MG TABLET,DELAYED RELEASE
ORAL_TABLET | Freq: Two times a day (BID) | ORAL | 5 refills | 30 days | Status: CP
Start: 2022-08-12 — End: ?
  Filled 2022-08-14: qty 120, 30d supply, fill #0

## 2022-08-12 NOTE — Unmapped (Addendum)
[facility] - PHARMACIST NOTES    Per discussion with Dr. Marland Mcalpine - stopping MMF and starting mycophenolic acid (Myfortic).   - Will start with 1:1 conversion now to mycophenolic acid 720mg  BID.   - Hydroxychloroquine increased to 300mg  /day or if unable to tolerate smell/taste - can take 200mg  MWF, and 400mg  other days of the week.     Temporary 30 day PAP benefit obtained for patient on 08/08/22 and PAP application mailed to patient's home.   Will follow up in ~2 weeks for tolerability.    Chelsea Aus

## 2022-08-13 NOTE — Unmapped (Signed)
[facility]   Patient Onboarding/Medication Counseling    [name] is a 37 y.o. female with SLE who I am counseling today on continuation of therapy.  I am speaking to the patient.    Was a Nurse, learning disability used for this call? No    Verified patient's date of birth / HIPAA.    Specialty medication(s) to be sent: Inflammatory Disorders: mycophenolate      Non-specialty medications/supplies to be sent: hydroxychloroquine       Medications not needed at this time: n/a         Myfortic (mycophenolic acid)    Medication & Administration     Dosage:   Take 2 tablets (720 mg total) by mouth two (2) times a day.    Administration:   Take with or without food, although taking with food helps minimize GI side effects.  Swallow the pills whole, do not chew or crush    Adherence/Missed dose instructions:  Take a missed dose as soon as you think about it.  If it is less than 2 hours until your next dose, skip the missed dose and go back to your normal time.  Do not take 2 doses at the same time or extra doses.    Goals of Therapy     To prevent organ rejection    Side Effects & Monitoring Parameters     Common side effects  Back or joint pain  Constipation  Headache/dizziness  Not hungry  Stomach pain, diarrhea, constipation, gas, upset stomach, vomiting, nausea  Feeling tired or weak  Shakiness  Trouble sleeping  Increased risk of infection    The following side effects should be reported to the provider:  Allergic reaction  High blood sugar (confusion, feeling sleepy, more thirst, more hungry, passing urine more often, flushing, fast breathing, or breath that smells like fruit)  Electrolyte issues (mood changes, confusion, muscle pain or weakness, a heartbeat that does not feel normal, seizures, not hungry, or very bad upset stomach or throwing up)  High or low blood pressure (bad headache or dizziness, passing out, or change in eyesight)  Kidney issues (unable to pass urine, change in how much urine is passed, blood in the urine, or a big weight gain)  Skin (oozing, heat, swelling, redness, or pain), UTI and other infections   Chest pain or pressure  Abnormal heartbeat  Unexplained bleeding or bruising  Abnormal burning, numbness, or tingling  Muscle cramps,  Yellowing of skin or eyes    Monitoring parameters  Pregnancy test initially prior to treatment and 8-10 days later then as needed)  CBC weekly for first month then twice monthly for next 2 months, then monthly)  Monitor Renal and liver functions  Signs of organ rejection    Contraindications, Warnings, & Precautions     *This is a REMS drug and an FDA-approved patient medication guide will be printed with each dispensation  Black Box Warning: Infections   Black Box Warning: Lymphoproliferative disorders - risk of development of lymphoma and skin malignancy is increased  Black Box Warning: Use during pregnancy is associated with increased risks of first trimester pregnancy loss and congenital malformations.   Black Box Warning: Females of reproductive potential should use contraception during treatment and for 6 weeks after therapy is discontinued  Is patient using an effective method of contraception? Yes  If yes, method of contraception:  OCP   CNS depression  New or reactivated viral infections  Neutropenia  Female patients and/or  their female partners should use effective contraception during treatment of the female patient and for at least 3 months after last dose.  Breastfeeding is not recommended during therapy and for 6 weeks after last dose    Drug/Food Interactions     Medication list reviewed in Epic. The patient was instructed to inform the care team before taking any new medications or supplements. No drug interactions identified.   Do not take Echinacea while on this medication  Check with your doctor before getting any vaccinations (live or inactivated)    Storage, Handling Precautions, & Disposal     Store at room temperature  Keep away from children and pets  This drug is considered hazardous and should be handled as little as possible.  Wash hands before and after touching pills. If someone else helps with medication administration, they should wear gloves.      Current Medications (including OTC/herbals), Comorbidities and Allergies     Current Outpatient Medications   Medication Sig Dispense Refill    apixaban (ELIQUIS) 5 mg Tab Take 1 tablet (5 mg total) by mouth two (2) times a day. 180 tablet 3    calcium carbonate-vitamin D3 600 mg(1,500mg ) -200 unit per tablet Take 1 tablet by mouth Two (2) times a day.      hydroxychloroquine (PLAQUENIL) 200 mg tablet Take 1.5 tablets (300 mg total) by mouth daily. If unable to tolerate the smell of splitting tab - can take 200mg  on MWF, 400mg  other days of the week 45 tablet 11    hydrOXYzine (ATARAX) 25 MG tablet TAKE 1 TABLET (25 MG TOTAL) BY MOUTH EVERY EIGHT (8) HOURS AS NEEDED. 30 tablet 1    hydrOXYzine (ATARAX) 25 MG tablet Take 1 tablet (25 mg total) by mouth every eight (8) hours as needed. 30 tablet 1    losartan (COZAAR) 25 MG tablet Take 1 tablet (25 mg total) by mouth daily. 90 tablet 3    melatonin 5 mg tablet Take 1 tablet (5 mg total) by mouth nightly.      miscellaneous medical supply Misc Pt needs a set of crutches. 1 each 0    mycophenolate (MYFORTIC) 360 MG TbEC Take 2 tablets (720 mg total) by mouth two (2) times a day. 120 tablet 5    norethindrone-ethinyl estradiol (MICROGESTIN 1/20) 1-20 mg-mcg per tablet Take 1 tablet by mouth daily. 84 tablet 0    omeprazole (PRILOSEC) 20 MG capsule Take 1 capsule (20 mg total) by mouth daily. 90 capsule 3    oxyCODONE (ROXICODONE) 5 MG immediate release tablet Take 1 tablet (5 mg total) by mouth in the morning. 30 tablet 0    predniSONE (DELTASONE) 5 MG tablet Taper by 10mg  every week as previously planned. 60 tablet 0    tizanidine (ZANAFLEX) 2 MG tablet TAKE 1 TABLET (2 MG TOTAL) BY MOUTH EVERY EIGHT (8) HOURS AS NEEDED. 60 tablet 0     No current facility-administered medications for this visit.       Allergies   Allergen Reactions    Sulfa (Sulfonamide Antibiotics) Nausea And Vomiting and Other (See Comments)     Other reaction(s): FEVER       Patient Active Problem List   Diagnosis    Cataract    Systemic lupus erythematosus (CMS-HCC)    Neoplasm of connective tissue    Disorder of bone and cartilage    Transaminitis    Cervical dysplasia    Rheumatoid arthritis (CMS-HCC)    Chest pain on  breathing    Neutropenia (CMS-HCC)    Rash       Reviewed and up to date in Epic.    Appropriateness of Therapy     Acute infections noted within Epic:  MRSA  Patient reported infection: None    Is medication and dose appropriate based on diagnosis and infection status? Yes    Prescription has been clinically reviewed: Yes      Baseline Quality of Life Assessment      How many days over the past month did your SLE  keep you from your normal activities? For example, brushing your teeth or getting up in the morning. 0    Financial Information     Medication Assistance provided: None Required    Anticipated copay of $0 reviewed with patient. Verified delivery address.    Delivery Information     Scheduled delivery date: 1/5    Expected start date: 1/5    Medication will be delivered via UPS to the prescription address in Epic [place].  This shipment will not require a signature.      Explained the services we provide at [facility] and that each month we would call to set up refills.  Stressed importance of returning phone calls so that we could ensure they receive their medications in time each month.  Informed patient that we should be setting up refills 7-10 days prior to when they will run out of medication.  A pharmacist will reach out to perform a clinical assessment periodically.  Informed patient that a welcome packet, containing information about our pharmacy and other support services, a Notice of Privacy Practices, and a drug information handout will be sent.      The patient or caregiver noted above participated in the development of this care plan and knows that they can request review of or adjustments to the care plan at any time.      Patient or caregiver verbalized understanding of the above information as well as how to contact the pharmacy at [phone] option 4 with any questions/concerns.  The pharmacy is open Monday through Friday 8:30am-4:30pm.  A pharmacist is available 24/7 via pager to answer any clinical questions they may have.    Patient Specific Needs     Does the patient have any physical, cognitive, or cultural barriers? No    Does the patient have adequate living arrangements? (i.e. the ability to store and take their medication appropriately) Yes    Did you identify any home environmental safety or security hazards? No    Patient prefers to have medications discussed with  Patient     Is the patient or caregiver able to read and understand education materials at a high school level or above? Yes    Patient's primary language is  English     Is the patient high risk? No    SOCIAL DETERMINANTS OF HEALTH     At the [facility], we have learned that life circumstances - like trouble affording food, housing, utilities, or transportation can affect the health of many of our patients.   That is why we wanted to ask: are you currently experiencing any life circumstances that are negatively impacting your health and/or quality of life? No    Social Determinants of Health     Financial Resource Strain: Low Risk  (07/11/2022)    Overall Financial Resource Strain (CARDIA)     Difficulty of Paying Living Expenses: Not hard at all  Internet Connectivity: No Internet connectivity concern identified (06/04/2022)    Internet Connectivity     Do you have access to internet services: Yes     How do you connect to the internet: Personal Device at home     Is your internet connection strong enough for you to watch video on your device without major problems?: Yes     Do you have enough data to get through the month?: Yes     Does at least one of the devices have a camera that you can use for video chat?: Yes   Food Insecurity: No Food Insecurity (07/11/2022)    Hunger Vital Sign     Worried About Running Out of Food in the Last Year: Never true     Ran Out of Food in the Last Year: Never true   Tobacco Use: Low Risk  (08/07/2022)    Patient History     Smoking Tobacco Use: Never     Smokeless Tobacco Use: Never     Passive Exposure: Not on file   Housing/Utilities: Low Risk  (07/11/2022)    Housing/Utilities     Within the past 12 months, have you ever stayed: outside, in a car, in a tent, in an overnight shelter, or temporarily in someone else's home (i.e. couch-surfing)?: No     Are you worried about losing your housing?: No     Within the past 12 months, have you been unable to get utilities (heat, electricity) when it was really needed?: No   Alcohol Use: Not At Risk (07/11/2022)    Alcohol Use     How often do you have a drink containing alcohol?: Never     How many drinks containing alcohol do you have on a typical day when you are drinking?: 1 - 2     How often do you have 5 or more drinks on one occasion?: Never   Transportation Needs: No Transportation Needs (07/11/2022)    PRAPARE - Transportation     Lack of Transportation (Medical): No     Lack of Transportation (Non-Medical): No   Substance Use: Low Risk  (07/11/2022)    Substance Use     Taken prescription drugs for non-medical reasons: Never     Taken illegal drugs: Never     Patient indicated they have taken drugs in the past year for non-medical reasons: Yes, [positive answer(s)]: Not on file   Health Literacy: Not on file   Physical Activity: Not on file   Interpersonal Safety: Not at risk (06/04/2022)    Interpersonal Safety     Unsafe Where You Currently Live: No     Physically Hurt by Anyone: No     Abused by Anyone: No   Stress: No Stress Concern Present (06/04/2022)    [employer] of Occupational Health - Occupational Stress Questionnaire     Feeling of Stress : Not at all   Intimate Partner Violence: Not At Risk (10/30/2021)    Humiliation, Afraid, Rape, and Kick questionnaire     Fear of Current or Ex-Partner: No     Emotionally Abused: No     Physically Abused: No     Sexually Abused: No   Depression: Not at risk (07/10/2020)    PHQ-2     PHQ-2 Score: 0   Social Connections: Not on file       Would you be willing to receive help with any of the needs that you have identified today? No  Julianne Rice, PharmD  [facility] Specialty Pharmacist

## 2022-08-13 NOTE — Unmapped (Signed)
[facility] Specialty Medication Onboarding    Specialty Medication: Mycophenolate 360mg  EC tablet  Prior Authorization: Not Required   Financial Assistance: No - copay  <$25  Final Copay/Day Supply: $0 / 30 days    Insurance Restrictions: Yes - max 1 month supply     Notes to Pharmacist: PAP Temp expires 09/07/2022. Use PAP temp benefit - pt is applying to full PAP    The triage team has completed the benefits investigation and has determined that the patient is able to fill this medication at [facility]. Please contact the patient to complete the onboarding or follow up with the prescribing physician as needed.

## 2022-08-14 DIAGNOSIS — R1319 Other dysphagia: Principal | ICD-10-CM

## 2022-08-14 DIAGNOSIS — L299 Pruritus, unspecified: Principal | ICD-10-CM

## 2022-08-14 DIAGNOSIS — M7918 Myalgia, other site: Principal | ICD-10-CM

## 2022-08-14 DIAGNOSIS — M329 Systemic lupus erythematosus, unspecified: Principal | ICD-10-CM

## 2022-08-14 MED ORDER — OMEPRAZOLE 20 MG CAPSULE,DELAYED RELEASE
ORAL_CAPSULE | Freq: Every day | ORAL | 3 refills | 90 days | Status: CP
Start: 2022-08-14 — End: 2023-08-14

## 2022-08-14 MED ORDER — HYDROXYZINE HCL 25 MG TABLET
ORAL_TABLET | Freq: Three times a day (TID) | ORAL | 0 refills | 30 days | Status: CP | PRN
Start: 2022-08-14 — End: ?

## 2022-08-14 MED ORDER — PREDNISONE 5 MG TABLET
ORAL_TABLET | Freq: Every day | ORAL | 0 refills | 90 days | Status: CP
Start: 2022-08-14 — End: 2022-11-12

## 2022-08-14 MED ORDER — TIZANIDINE 2 MG TABLET
ORAL_TABLET | Freq: Three times a day (TID) | ORAL | 0 refills | 30 days | Status: CP | PRN
Start: 2022-08-14 — End: 2022-12-12

## 2022-08-15 ENCOUNTER — Ambulatory Visit: Admit: 2022-08-15 | Discharge: 2022-08-16 | Attending: Family | Primary: Family

## 2022-08-15 DIAGNOSIS — R030 Elevated blood-pressure reading, without diagnosis of hypertension: Principal | ICD-10-CM

## 2022-08-15 DIAGNOSIS — I10 Essential (primary) hypertension: Principal | ICD-10-CM

## 2022-08-15 MED ORDER — LOSARTAN 50 MG TABLET
ORAL_TABLET | Freq: Every day | ORAL | 3 refills | 90 days | Status: CP
Start: 2022-08-15 — End: 2023-08-15

## 2022-08-15 NOTE — Unmapped (Addendum)
Increase losartan to 50 mg daily (can take two 25 mg tablets every morning until you finish the bottle). I have sent in the prescription for the 50 mg tablets for the next bottle.     Continue to check your blood pressure on a regular basis. Be sure to check a few hours after you take your blood pressure measurement.   Goal is blood pressure   Less than 130 on the top  Less than 90 on the bottom

## 2022-08-15 NOTE — Unmapped (Signed)
Assessment and Plan:      Diagnosis ICD-10-CM Associated Orders   1. Primary hypertension  I10 losartan (COZAAR) 50 MG tablet      BP not well controlled. Discussed medication options. Increase losartan 25 mg --> 50 mg daily. Reviewed low sodium diet and encouraged regular exercise. Advised to continue to monitor and log at-home BP readings. Contact clinic if readings become elevated.       I personally spent 22 minutes face-to-face and non-face-to-face in the care of this patient, which includes all pre, intra, and post visit time on the date of service.    Return in about 2 months (around 10/14/2022) for Next scheduled follow up.    HPI:      Julia Bates is here for   Chief Complaint   Patient presents with    Follow-up     Recheck BP.  Still up and down.     Hypertension: Patient presents for follow-up of hypertension. Blood pressure goal < 140/90.  Hypertension has customarily not been at goal complicated by recent respiratory illness and decongestant use.  Home blood pressure readings: Highest out of clinic BP-164/116, 156/118, 164/113 . Salt intake and diet: salt not added to cooking. Associated signs and symptoms:  dizziness . Patient denies: blurred vision, chest pain, dyspnea, headache, neck aches, orthopnea, palpitations, paroxysmal nocturnal dyspnea, peripheral edema, pulsating in the ears, and tiredness/fatigue. Medication compliance: taking as prescribed. She is not doing regular exercise.     Patient taking morning BP measurements, takes medication after taking BP. Takes medication around 10 am.      Answers submitted by the patient for this visit:  High Blood Pressure Questionnaire (Submitted on 08/08/2022)  Chief Complaint: Hypertension  Chronicity: new  Onset: more than 1 month ago  Progression since onset: unchanged  Condition status: controlled  anxiety: No  blurred vision: No  chest pain: No  headaches: No  malaise/fatigue: No  neck pain: No  orthopnea: No  palpitations: No  peripheral edema: No  PND: No  shortness of breath: No  sweats: No  Agents associated with hypertension: decongestants, oral contraceptives  CAD risks: no known risk factors  Compliance problems: no compliance problems             ROS:      Comprehensive 10 point ROS negative unless otherwise stated in the HPI.      [facility] Components:     Medication adherence and barriers to the treatment plan have been addressed. Opportunities to optimize healthy behaviors have been discussed. Patient / caregiver voiced understanding.    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    ECC pos CIN 1--12/19    GERD (gastroesophageal reflux disease) Oct 23    HPV (human papilloma virus) infection     Lupus (CMS-HCC)     Lupus (CMS-HCC)     Rheumatoid arthritis (CMS-HCC) 11/18/2015    SLE (systemic lupus erythematosus related syndrome) (CMS-HCC)      Past Surgical History:   Procedure Laterality Date    CERVICAL BIOPSY  W/ LOOP ELECTRODE EXCISION      COLPOSCOPY      SKIN BIOPSY  2013-2014       Family History:     Family History  Problem Relation Age of Onset    Glaucoma Father     Diabetes Father     Hypertension Father     No Known Problems Mother     No Known Problems Sister     Diabetes Sister     Diabetes Sister     Melanoma Neg Hx     Basal cell carcinoma Neg Hx     Squamous cell carcinoma Neg Hx     Macular degeneration Neg Hx     Blindness Neg Hx        Social History:     Social History     Tobacco Use    Smoking status: Never    Smokeless tobacco: Never    Tobacco comments:     None   Vaping Use    Vaping Use: Never used   Substance Use Topics    Alcohol use: Never    Drug use: Never       Allergies:     Sulfa (sulfonamide antibiotics)    Current Medications:     Current Outpatient Medications   Medication Sig Dispense Refill    apixaban (ELIQUIS) 5 mg Tab Take 1 tablet (5 mg total) by mouth two (2) times a day. 180 tablet 3    calcium carbonate-vitamin D3 600 mg(1,500mg ) -200 unit per tablet Take 1 tablet by mouth Two (2) times a day.      hydroxychloroquine (PLAQUENIL) 200 mg tablet Take 1.5 tablets (300 mg total) by mouth daily. If unable to tolerate the smell of splitting tab - can take 200mg  on MWF, 400mg  other days of the week 45 tablet 11    hydrOXYzine (ATARAX) 25 MG tablet Take 1 tablet (25 mg total) by mouth every eight (8) hours as needed. 90 tablet 0    losartan (COZAAR) 25 MG tablet Take 1 tablet (25 mg total) by mouth daily. 90 tablet 3    melatonin 5 mg tablet Take 1 tablet (5 mg total) by mouth nightly.      miscellaneous medical supply Misc Pt needs a set of crutches. 1 each 0    mycophenolate (MYFORTIC) 360 MG TbEC Take 2 tablets (720 mg total) by mouth two (2) times a day. 120 tablet 5    norethindrone-ethinyl estradiol (MICROGESTIN 1/20) 1-20 mg-mcg per tablet Take 1 tablet by mouth daily. 84 tablet 0    omeprazole (PRILOSEC) 20 MG capsule Take 1 capsule (20 mg total) by mouth daily. 90 capsule 3    oxyCODONE (ROXICODONE) 5 MG immediate release tablet Take 1 tablet (5 mg total) by mouth in the morning. 30 tablet 0    predniSONE (DELTASONE) 5 MG tablet Take 1 tablet (5 mg total) by mouth daily. Taper by 10mg  every week as previously planned. 90 tablet 0    tizanidine (ZANAFLEX) 2 MG tablet Take 1 tablet (2 mg total) by mouth every eight (8) hours as needed. 90 tablet 0     No current facility-administered medications for this visit.       Health Maintenance:     Health Maintenance   Topic Date Due    Pneumococcal Vaccine 0-64 (3 - PPSV23 or PCV20) 08/06/2020    COVID-19 Vaccine (3 - Pfizer risk series) 10/27/2020    Influenza Vaccine (1) 04/11/2022    Potassium Monitoring  05/25/2023    Serum Creatinine Monitoring  07/26/2023    DTaP/Tdap/Td Vaccines (4 - Td or Tdap) 04/09/2025    HPV Cotest with Pap Smear (21-65)  11/03/2026  Pap Smear with Cotest HPV (21-65)  11/13/2026 Hepatitis C Screen  Completed       Immunizations:     Immunization History   Administered Date(s) Administered    COVID-19 VAC,MRNA,TRIS(12Y UP)(PFIZER)(GRAY CAP) 09/08/2020    COVID-19 VACC,MRNA,(PFIZER)(PF) 09/29/2020    DTP 12/08/1990    HPV Quadrivalent (Gardasil) 03/24/2006, 06/01/2006, 10/02/2006    Influenza Vaccine Quad(IM)6 MO-Adult(PF) 06/05/2015, 05/06/2016, 07/07/2017, 07/06/2018, 04/29/2019, 05/17/2020    Influenza Virus Vaccine, unspecified formulation 05/06/2016, 07/07/2017    MMR 12/08/1990    Meningococcal C Conjugate 03/08/2004    Novel Influenza-h1n1-09, All Formulations 07/20/2008    PNEUMOCOCCAL POLYSACCHARIDE 23-VALENT 08/07/2015    PPD Test 05/20/2009    Pneumococcal Conjugate 13-Valent 04/10/2015    SHINGRIX-ZOSTER VACCINE (HZV),RECOMBINANT,ADJUVANTED(IM) 12/04/2021, 07/05/2022    TdaP 02/16/2012, 04/10/2015     I have reviewed and (if needed) updated the patient's problem list, medications, allergies, past medical and surgical history, social and family history.     Vital Signs:     Wt Readings from Last 3 Encounters:   08/15/22 62.1 kg (137 lb)   07/29/22 62.6 kg (138 lb)   07/25/22 62.3 kg (137 lb 6.4 oz)     Temp Readings from Last 3 Encounters:   08/15/22 36.8 ??C (98.3 ??F) (Oral)   07/29/22 37.5 ??C (99.5 ??F) (Temporal)   07/25/22 36.7 ??C (98.1 ??F) (Temporal)     BP Readings from Last 3 Encounters:   08/15/22 136/88   07/29/22 125/82   07/25/22 136/102     Pulse Readings from Last 3 Encounters:   08/15/22 84   07/29/22 84   07/25/22 113     Estimated body mass index is 27.67 kg/m?? as calculated from the following:    Height as of this encounter: 149.9 cm (4' 11).    Weight as of this encounter: 62.1 kg (137 lb).  Facility age limit for growth %iles is 20 years.      Objective:      General: Alert and oriented x3. Well-appearing. No acute distress.   HEENT:  Normocephalic.  Atraumatic. Conjunctiva and sclera normal. OP MMM without lesions.   Neck:  Supple. No thyroid enlargement. No adenopathy.   Heart:  Regular rate and rhythm. Normal S1, S2. No murmurs, rubs or gallops.   Lungs:  No respiratory distress.  Lungs clear to auscultation. No wheezes, rhonchi, or rales.   GI/GU:  Soft, +BS, nondistended, non-TTP. No palpable masses or organomegaly.   Extremities:  No edema. Peripheral pulses normal.   Skin:  Warm, dry. No rash or lesions present.   Neuro:  Non-focal. No obvious weakness.   Psych:  Affect normal, eye contact good, speech clear and coherent.       Noralyn Pick, FNP

## 2022-08-18 DIAGNOSIS — M3219 Other organ or system involvement in systemic lupus erythematosus: Principal | ICD-10-CM

## 2022-08-18 MED ORDER — HYDROXYCHLOROQUINE 200 MG TABLET
ORAL_TABLET | Freq: Every day | ORAL | 11 refills | 30 days
Start: 2022-08-18 — End: ?

## 2022-08-21 ENCOUNTER — Ambulatory Visit: Admit: 2022-08-21 | Discharge: 2022-08-22

## 2022-08-21 MED ADMIN — gadobenate dimeglumine (MULTIHANCE) 529 mg/mL (0.1mmol/0.2mL) solution 6 mL: 6 mL | INTRAVENOUS | @ 22:00:00 | Stop: 2022-08-21

## 2022-08-24 DIAGNOSIS — G8929 Other chronic pain: Principal | ICD-10-CM

## 2022-08-24 MED ORDER — OXYCODONE 5 MG TABLET
ORAL_TABLET | Freq: Every day | ORAL | 0 refills | 30 days
Start: 2022-08-24 — End: ?

## 2022-08-26 MED ORDER — OXYCODONE 5 MG TABLET
ORAL_TABLET | Freq: Every day | ORAL | 0 refills | 30 days | Status: CP
Start: 2022-08-26 — End: ?

## 2022-08-26 NOTE — Unmapped (Signed)
Oxycodone refill  Last ov: 07/25/2022  Next ov: 10/21/2022

## 2022-08-27 NOTE — Unmapped (Signed)
[facility] - PHARMACIST NOTES    Reached out to follow up on tolerability of Myfortic. No answer, LVM requesting call back.    Chelsea Aus

## 2022-09-02 NOTE — Unmapped (Signed)
Please see this patient's message.  Okay to send the prescription with a 60-month supply for her.

## 2022-09-03 NOTE — Unmapped (Signed)
Julia Bates reports significant GI pain and nausea on Myfortic, especially in the morning. On investigation, she's taking on an empty stomach per package instructions. I reviewed that we actually recommend taking WITH food in inflammatory conditions to help reduce GI upset. She expressed understanding.    Her PAP application has not yet been received (I called with her to the PAP line to verify) - she mailed ~1.5-2 weeks ago. The PAP team will mail her an additional application in case it's not received/she needs to fill out again and fax.     [facility] Clinical Assessment & Refill Coordination Note    Julia Bates, [place]: [birth year]  Phone: [phone] (home)     All above HIPAA information was verified with patient.     Was a Nurse, learning disability used for this call? No    Specialty Medication(s):   Inflammatory Disorders: mycophenolate     Current Outpatient Medications   Medication Sig Dispense Refill    apixaban (ELIQUIS) 5 mg Tab Take 1 tablet (5 mg total) by mouth two (2) times a day. 180 tablet 3    calcium carbonate-vitamin D3 600 mg(1,500mg ) -200 unit per tablet Take 1 tablet by mouth Two (2) times a day.      hydroxychloroquine (PLAQUENIL) 200 mg tablet Take 1.5 tablets (300 mg total) by mouth daily. If unable to tolerate the smell of splitting tab - can take 200mg  on MWF, 400mg  other days of the week 45 tablet 11    hydrOXYzine (ATARAX) 25 MG tablet Take 1 tablet (25 mg total) by mouth every eight (8) hours as needed. 90 tablet 0    losartan (COZAAR) 50 MG tablet Take 1 tablet (50 mg total) by mouth daily. 90 tablet 3    melatonin 5 mg tablet Take 1 tablet (5 mg total) by mouth nightly.      miscellaneous medical supply Misc Pt needs a set of crutches. 1 each 0    mycophenolate (MYFORTIC) 360 MG TbEC Take 2 tablets (720 mg total) by mouth two (2) times a day. 120 tablet 5    norethindrone-ethinyl estradiol (MICROGESTIN 1/20) 1-20 mg-mcg per tablet Take 1 tablet by mouth daily. 84 tablet 0    omeprazole (PRILOSEC) 20 MG capsule Take 1 capsule (20 mg total) by mouth daily. 90 capsule 3    oxyCODONE (ROXICODONE) 5 MG immediate release tablet Take 1 tablet (5 mg total) by mouth in the morning. 30 tablet 0    predniSONE (DELTASONE) 5 MG tablet Take 1 tablet (5 mg total) by mouth daily. Taper by 10mg  every week as previously planned. 90 tablet 0    tizanidine (ZANAFLEX) 2 MG tablet Take 1 tablet (2 mg total) by mouth every eight (8) hours as needed. 90 tablet 0     No current facility-administered medications for this visit.        Changes to medications: Jenni reports no changes at this time.    Allergies   Allergen Reactions    Sulfa (Sulfonamide Antibiotics) Nausea And Vomiting and Other (See Comments)     Other reaction(s): FEVER       Changes to allergies: No    SPECIALTY MEDICATION ADHERENCE     Mycophenolate ~ 14 days  Medication Adherence    Patient reported X missed doses in the last month: 0  Specialty Medication: mycophenolate  Specialty medication(s) dose(s) confirmed: Regimen is correct and unchanged.     Are there any concerns with adherence? No    Adherence counseling provided? Not needed    CLINICAL MANAGEMENT AND INTERVENTION      Clinical Benefit Assessment:    Do you feel the medicine is effective or helping your condition? Patient declined to answer    Clinical Benefit counseling provided? Not needed    Adverse Effects Assessment:    Are you experiencing any side effects? Yes, patient reports experiencing GI upset/nausea. Side effect counseling provided: see above - take with food    Are you experiencing difficulty administering your medicine? No    Quality of Life Assessment:    Quality of Life    Rheumatology  1. What impact has your specialty medication had on the reduction of your daily pain level?: Some  2. What impact has your specialty medication had on your ability to complete daily tasks (prepare meals, get dressed, etc...)?: Some  Oncology  Dermatology  Cystic Fibrosis          How many days over the past month did your SLE  keep you from your normal activities? For example, brushing your teeth or getting up in the morning. Patient declined to answer    Have you discussed this with your provider? Not needed    Acute Infection Status:    Acute infections noted within Epic:  MRSA  Patient reported infection: None    Therapy Appropriateness:    Is therapy appropriate and patient progressing towards therapeutic goals? Yes, therapy is appropriate and should be continued    DISEASE/MEDICATION-SPECIFIC INFORMATION      N/A    Chronic Inflammatory Diseases: Have you experienced any flares in the last month? No  Has this been reported to your provider? No    PATIENT SPECIFIC NEEDS     Does the patient have any physical, cognitive, or cultural barriers? No    Is the patient high risk? No    Did the patient require a clinical intervention? No    Does the patient require physician intervention or other additional services (i.e., nutrition, smoking cessation, social work)? No    SOCIAL DETERMINANTS OF HEALTH     At the [facility], we have learned that life circumstances - like trouble affording food, housing, utilities, or transportation can affect the health of many of our patients.   That is why we wanted to ask: are you currently experiencing any life circumstances that are negatively impacting your health and/or quality of life? Patient declined to answer    Social Determinants of Health     Financial Resource Strain: Low Risk  (07/11/2022)    Overall Financial Resource Strain (CARDIA)     Difficulty of Paying Living Expenses: Not hard at all   Internet Connectivity: No Internet connectivity concern identified (06/04/2022)    Internet Connectivity     Do you have access to internet services: Yes     How do you connect to the internet: Personal Device at home     Is your internet connection strong enough for you to watch video on your device without major problems?: Yes     Do you have enough data to get through the month?: Yes     Does at least one of the devices have a camera that you can use for video chat?: Yes   Food Insecurity: No Food Insecurity (07/11/2022)    Hunger Vital Sign  Worried About Programme researcher, broadcasting/film/video in the Last Year: Never true     Ran Out of Food in the Last Year: Never true   Tobacco Use: Low Risk  (08/24/2022)    Patient History     Smoking Tobacco Use: Never     Smokeless Tobacco Use: Never     Passive Exposure: Not on file   Housing/Utilities: Low Risk  (07/11/2022)    Housing/Utilities     Within the past 12 months, have you ever stayed: outside, in a car, in a tent, in an overnight shelter, or temporarily in someone else's home (i.e. couch-surfing)?: No     Are you worried about losing your housing?: No     Within the past 12 months, have you been unable to get utilities (heat, electricity) when it was really needed?: No   Alcohol Use: Not At Risk (08/15/2022)    Alcohol Use     How often do you have a drink containing alcohol?: Never     How many drinks containing alcohol do you have on a typical day when you are drinking?: 1 - 2     How often do you have 5 or more drinks on one occasion?: Never   Transportation Needs: No Transportation Needs (07/11/2022)    PRAPARE - Transportation     Lack of Transportation (Medical): No     Lack of Transportation (Non-Medical): No   Substance Use: Low Risk  (08/15/2022)    Substance Use     Taken prescription drugs for non-medical reasons: Never     Taken illegal drugs: Never     Patient indicated they have taken drugs in the past year for non-medical reasons: Yes, [positive answer(s)]: Not on file   Health Literacy: Not on file   Physical Activity: Not on file   Interpersonal Safety: Not at risk (06/04/2022)    Interpersonal Safety     Unsafe Where You Currently Live: No     Physically Hurt by Anyone: No     Abused by Anyone: No   Stress: No Stress Concern Present (06/04/2022)    [employer] of Occupational Health - Occupational Stress Questionnaire     Feeling of Stress : Not at all   Intimate Partner Violence: Not At Risk (10/30/2021)    Humiliation, Afraid, Rape, and Kick questionnaire     Fear of Current or Ex-Partner: No     Emotionally Abused: No     Physically Abused: No     Sexually Abused: No   Depression: Not at risk (07/10/2020)    PHQ-2     PHQ-2 Score: 0   Social Connections: Not on file       Would you be willing to receive help with any of the needs that you have identified today? Not applicable       SHIPPING     Specialty Medication(s) to be Shipped:   Inflammatory Disorders: mycophenolate    Other medication(s) to be shipped:  hydroxyurea     Changes to insurance: No    Delivery Scheduled: Yes, Expected medication delivery date: 2/1.     Medication will be delivered via Next Day Courier to the confirmed prescription address in [facility].    The patient will receive a drug information handout for each medication shipped and additional FDA Medication Guides as required.  Verified that patient has previously received a Conservation officer, historic buildings and a Surveyor, mining.    The patient or caregiver noted above participated  in the development of this care plan and knows that they can request review of or adjustments to the care plan at any time.      All of the patient's questions and concerns have been addressed.    Lanney Gins   [facility] Specialty Pharmacist

## 2022-09-04 DIAGNOSIS — S76211S Strain of adductor muscle, fascia and tendon of right thigh, sequela: Principal | ICD-10-CM

## 2022-09-05 NOTE — Unmapped (Signed)
Looks like she has signed release dated 09/03/2021 scanned in.  Under Advance Directives.

## 2022-09-06 NOTE — Unmapped (Signed)
Spoke to [employer], she will fax to [facility] again since nothing in chart from [facility] that they sent records.

## 2022-09-10 NOTE — Unmapped (Signed)
Julia Bates 's entire shipment will be delayed as a result of the patient's insurance being terminated.      I have reached out to the patient  at [phone] and left a voicemail message.  We will wait for a call back from the patient to reschedule the delivery.  We have not confirmed the new delivery date.     PAP Temp has expired, and no PAP benefit has been applied to patient's account yet. No other insurance able to be found.

## 2022-09-10 NOTE — Unmapped (Unsigned)
Julia Bates not sure why she is getting this message and asked front staff to address.  Request has been sent to [facility] twice.

## 2022-09-16 DIAGNOSIS — M3219 Other organ or system involvement in systemic lupus erythematosus: Principal | ICD-10-CM

## 2022-09-16 MED ORDER — PREDNISONE 1 MG TABLET
ORAL_TABLET | Freq: Every day | ORAL | 1 refills | 30 days | Status: CP
Start: 2022-09-16 — End: ?

## 2022-09-16 NOTE — Unmapped (Signed)
Prednisone refill  Last ov: 07/25/2022  Next ov: 10/21/2022

## 2022-09-16 NOTE — Unmapped (Addendum)
[facility] RHEUMATOLOGY CLINIC - PHARMACIST NOTES    Called patient to follow up on side effects with Myfortic. Patient has been taking Myfortic 720 mg BID with food for SLE but continues to experience nausea and vomiting with morning dose (no GI symptoms with evening dose).    Patient was taking Cellcept 1000 mg BID previously but wanted to try Myfortic due to chest discomfort/fullness/reflux symptoms with Cellcept. After discussion with patient, agreed to change back to Cellcept 1000 mg BID.     Patient also reported that she will drop PAP application off at [facility] outpatient pharmacy tomorrow.     Plan:  Stop taking Myfortic 720 mg BID. Counseled patient on proper disposal method to avoid confusion with Cellcept dose.  Re-start Cellcept 1000 mg BID, follow-up with patient next week to assess tolerability.    All questions were answered and contact information provided for any future questions/concerns.      Waverly Ferrari  PharmD Candidate [address] [employer] Ambulatory Care Intern

## 2022-09-19 NOTE — Unmapped (Signed)
[facility] Cardiology at [facility]  [address] [place], [place] [address]   Phone: [phone]    Date of Service: 09/23/2022    Patient Clinic Note    PCP: Referring Provider:   Loran Senters, FNP  [address] [place] [address]  Phone: [phone]  Fax: [phone] Farrel Conners, MD  [address] [place],  [place] [address]  Phone: [phone]  Fax: [phone]       Assessment and Plan:     Julia Bates is a 37 y.o. female with PMHX of SLE on immunosuppresion, history of pericarditis who presents for cardiac follow-up.     Progressive dyspnea on exertion  - New since our last visit.  Patient notes that her pericarditis symptoms are resolved and are doing well on current therapy for her lupus.  However notes progressive significant dyspnea with exertion to the point where she is unable to walk around the grocery store without needing to stop to catch her breath.  This has been worsening over the past several weeks and patient now is able to walk about 50 yards prior to needing to stop which is a large change from when I last saw her.  - Coronary CTA for ischemic evaluation  - Symptoms not consistent with recurrent pericarditis.  Would not recommend repeat echocardiogram at this time, although this does persist could consider diagnosis of constriction, however I feel this is unlikely given she has only had 1 episode of pericarditis in the past    Pericarditis  - Resolved.  Occurred 02/2022 during lupus flare.  - Resolved with prednisone and colchicine.      Systemic lupus erythematous  - Follows with rheumatology for this.        Lab Results   Component Value Date    CHOL 206 (H) 03/18/2022     Lab Results   Component Value Date    HDL 60 03/18/2022     Lab Results   Component Value Date    LDL 111 (H) 03/18/2022     Lab Results   Component Value Date    VLDL 35 (H) 03/18/2022     Lab Results   Component Value Date    CHOLHDLRATIO 3.4 03/18/2022     Lab Results   Component Value Date TRIG 175 (H) 03/18/2022       The ASCVD Risk score (Arnett DK, et al., 2019) failed to calculate for the following reasons:    The 2019 ASCVD risk score is only valid for ages 24 to 53    Note: For patients with SBP <90 or >200, Total Cholesterol <130 or >320, HDL <20 or >100 which are outside of the allowable range, the calculator will use these upper or lower values to calculate the patient???s risk score.      Lab Results   Component Value Date    A1C 5.3 08/13/2010         Return in about 6 months (around 03/24/2023).          Subjective:     Chief Complaint:  Chief Complaint   Patient presents with    Routine Follow-up         Referring Provider: Farrel Conners, MD    History of Present Illness:     Julia Bates is a 37 y.o. female, with a history of lupus and acute pericarditis 02/2022 who is here for follow-up of pericarditis.     Patient  states from a pericarditis perspective she has been feeling well.  She denies any recurrence of her lupus, and no symptoms consistent with her past pericarditis episodes.  She has unfortunately been experience significant dyspnea with exertion, that is limiting her ability to even walk around a grocery store.  She states she can walk about 50 feet before becoming severely dyspneic needing to stop.  Denies syncope or presyncope associated with exertion.      -----Presenting HPI-----------  Patient recently admitted to [facility] for acute chest pain, diagnosed with pericarditis and placed on increased immunotherapy with rheumatology input.  She is now on 0.5 mg of prednisone daily, done with her colchicine.  She notes 1 out of 10 chronic chest discomfort which is improved since presentation to the hospital but about the same as when she was discharged.  She also notes some shortness of breath with exertion most notably when she is going upstairs as she works for [employer] (physical [employer]) occasionally.  She states this is new and was not an issue prior to this pericarditis episode.    Denies syncope, presyncope, significant chest discomfort.    Cardiovascular History & Procedures:  Cardiovascular Problems:  Pericarditis  Lupus    Cath / PCI:  None    CV Surgery:  None    EP Procedures and Devices:  None    Non-Invasive Evaluation(s): independently reviewed the most recent study.   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 She has never used smokeless tobacco. She reports that she does not drink alcohol and does not use drugs.    Family History:  family history includes Diabetes in her father, sister, and sister; Glaucoma in her father; Hypertension in her father; No Known Problems in her mother and sister.    Review of Systems:   Except as noted in the HPI, the remainder of 10 systems reviewed is negative.     Allergies:  Allergies   Allergen Reactions    Sulfa (Sulfonamide Antibiotics) Nausea And Vomiting and Other (See Comments)     Other reaction(s): FEVER       Medications:   Prior to Admission medications    Medication Dose, Route, Frequency   apixaban (ELIQUIS) 5 mg Tab 5 mg, Oral, 2 times a day (standard)   calcium carbonate-vitamin D3 600 mg(1,500mg ) -200 unit per tablet 1 tablet, Oral, 2 times a day   hydroxychloroquine (PLAQUENIL) 200 mg tablet 300 mg, Oral, Daily (standard), If unable to tolerate the smell of splitting  tab - can take 200mg  on MWF, 400mg  other days of the week   hydrOXYzine (ATARAX) 25 MG tablet 25 mg, Oral, Every 8 hours PRN   losartan (COZAAR) 50 MG tablet 50 mg, Oral, Daily (standard)   melatonin 5 mg tablet 5 mg, Oral, Nightly   miscellaneous medical supply Misc Pt needs a set of crutches.   mycophenolate (CELLCEPT) 500 mg tablet 1,000 mg, Oral, 2 times a day (standard), Stopping Myfortic, resume MMF   norethindrone-ethinyl estradiol (MICROGESTIN 1/20) 1-20 mg-mcg per tablet 1 tablet, Oral, Daily (standard)   omeprazole (PRILOSEC) 20 MG capsule 20 mg, Oral, Daily (standard)   oxyCODONE (ROXICODONE) 5 MG immediate release tablet 5 mg, Oral, Daily   predniSONE (DELTASONE) 5 MG tablet 5 mg, Oral, Daily (standard), Taper by 10mg  every week as previously planned.   tizanidine (ZANAFLEX) 2 MG tablet 2 mg, Oral, Every 8 hours PRN   predniSONE (DELTASONE) 1 MG tablet 1 mg, Oral, Daily (standard)  Patient taking differently: Take 5 tablets (5 mg total) by mouth daily.        Objective:     Vitals  BP 107/66 (BP Site: R Arm, BP Position: Sitting, BP Cuff Size: Medium)  - Pulse 75  - Ht 149.9 cm (4' 11)  - Wt 60.4 kg (133 lb 1.6 oz)  - BMI 26.88 kg/m??      Wt Readings from Last 3 Encounters:   09/23/22 60.4 kg (133 lb 1.6 oz)   08/15/22 62.1 kg (137 lb)   07/29/22 62.6 kg (138 lb)       Physical Exam  General:  Pleasant female sitting in chair in nad.   Neck: Supple, JVP normal.   Resp:   CTAB bilaterally with normal WOB.   Cardio:  RRR without m/r/g.   Abdomen:   Soft, non-distended, non-tender.   Extremities: Warm well-perfused bilaterally. No edema bilaterally.   MSK: No joint swelling or erythema. No gross deformities.   Skin: No rashes   Neuro: CN II-XII grossly intact. Strength grossly intact.    Psych: Alert and oriented x3. Appropriate mood.      ECG (09/25/22) - independently interpreted.  None today.  Most recent ECG shows normal ECG    Most Recent Labs   Lab Results   Component Value Date    NA 141 05/24/2022    K 3.7 05/24/2022    CL 108 (H) 05/24/2022    CO2 24.0 05/24/2022     Lab Results   Component Value Date    BUN 10 05/24/2022    BUN 6 (L) 05/23/2022    BUN 6 (L) 08/15/2014    BUN 8 08/14/2014     Lab Results   Component Value Date    Creatinine/CP 0.51 (L) 08/27/2010    Creatinine 0.82 07/25/2022    Creatinine 0.69 05/24/2022    Creatinine 0.73 11/16/2014    Creatinine 0.56 (L) 09/21/2014     No results found for: PROBNP  Lab Results   Component Value Date    Cholesterol 206 (H) 03/18/2022    Triglycerides 175 (H) 03/18/2022    HDL 60 03/18/2022    Non-HDL Cholesterol 146 (H) 03/18/2022    LDL Calculated 111 (H) 03/18/2022

## 2022-09-19 NOTE — Unmapped (Signed)
[facility] Refill Coordination Note    Specialty Medication(s) to be Shipped:   Inflammatory Disorders: Mycophenolate 360 EC    Other medication(s) to be shipped:  plaquenil     Julia Bates, DOB: 27-Jun-1986  Phone: [phone] (home)       All above HIPAA information was verified with patient.     Was a Nurse, learning disability used for this call? No    Completed refill call assessment today to schedule patient's medication shipment from the [facility] [phone]).  All relevant notes have been reviewed.     Specialty medication(s) and dose(s) confirmed: Regimen is correct and unchanged.   Changes to medications: Julia Bates reports no changes at this time.  Changes to insurance: No  New side effects reported not previously addressed with a pharmacist or physician: None reported  Questions for the pharmacist: No    Confirmed patient received a Conservation officer, historic buildings and a Surveyor, mining with first shipment. The patient will receive a drug information handout for each medication shipped and additional FDA Medication Guides as required.       DISEASE/MEDICATION-SPECIFIC INFORMATION        N/A    SPECIALTY MEDICATION ADHERENCE     Medication Adherence    Specialty Medication: hydroxychloroquine 200 mg tablet (PLAQUENIL)  Patient is on additional specialty medications: Yes  Additional Specialty Medications: mycophenolate 360 MG Tbec (MYFORTIC)  Patient Reported Additional Medication X Missed Doses in the Last Month: 0  Patient is on more than two specialty medications: No  Any gaps in refill history greater than 2 weeks in the last 3 months: no  Demonstrates understanding of importance of adherence: yes  Informant: patient  Reliability of informant: reliable  Provider-estimated medication adherence level: good  Patient is at risk for Non-Adherence: No  Reasons for non-adherence: no problems identified                                Were doses missed due to medication being on hold? No    mycophenolate 360 MG Tbec (MYFORTIC)  : 5 days of medicine on hand       REFERRAL TO PHARMACIST     Referral to the pharmacist: Not needed      [facility]     Shipping address confirmed in Epic.     Delivery Scheduled: Yes, Expected medication delivery date: 09/23/22.     Medication will be delivered via UPS to the prescription address in Epic WAM.    Julia Bates' [facility] Julia Bates [facility] Specialty Technician

## 2022-09-19 NOTE — Unmapped (Signed)
09/18/22-spoke with patient she said she took info to PAP office @ the hospital ,but they required additional info so she states will go back 02/09 -CB

## 2022-09-22 DIAGNOSIS — M3219 Other organ or system involvement in systemic lupus erythematosus: Principal | ICD-10-CM

## 2022-09-22 MED ORDER — MYCOPHENOLATE MOFETIL 500 MG TABLET
ORAL_TABLET | Freq: Two times a day (BID) | ORAL | 3 refills | 90 days | Status: CP
Start: 2022-09-22 — End: 2023-09-22
  Filled 2022-09-29: qty 360, 90d supply, fill #0

## 2022-09-22 MED FILL — HYDROXYCHLOROQUINE 200 MG TABLET: ORAL | 30 days supply | Qty: 45 | Fill #1

## 2022-09-22 NOTE — Unmapped (Signed)
[facility] RHEUMATOLOGY CLINIC - PHARMACIST NOTES    Called patient to follow up on tolerability of mycophenolate mofetil (MMF) 1000 mg PO BID.    Patient had been on MMF in the past but complained of chest discomfort/fullness/reflux symptoms. Patient switched to Myfortic 720 mg PO BID but experienced nausea and vomiting when taking with or without food. She agreed to switch back to MMF 1000 mg PO BID last Monday 09/15/22.     Patient reports that taking omeprazole 20 mg PO daily helps with the reflux symptoms when taking MMF, but she has stomach pain when taking MMF early in the morning. She describes the pain as feeling extremely hungry like I have not eaten in days. She states that she has nausea and vomiting when taking with food.     After discussing options of initiating Rituximab infusion, switching back to Myfortic, or continuing MMF, patient agrees to continue MMF 1000 mg PO BID.     Patient is approved for [facility] PAP through 11/10/2022. She already applied for Medicaid and received denial letter and plans to fax to PAP team for continued PAP approval beyond April.        Plan:  Continue mycophenolate mofetil (MMF) 1000 mg BID    All questions were answered and contact information provided for any future questions/concerns.      Waverly Ferrari  PharmD Candidate [address] [employer] Intern

## 2022-09-23 ENCOUNTER — Ambulatory Visit
Admit: 2022-09-23 | Discharge: 2022-09-24 | Attending: Student in an Organized Health Care Education/Training Program | Primary: Student in an Organized Health Care Education/Training Program

## 2022-09-23 DIAGNOSIS — R079 Chest pain, unspecified: Principal | ICD-10-CM

## 2022-09-23 DIAGNOSIS — E7849 Other hyperlipidemia: Principal | ICD-10-CM

## 2022-09-23 DIAGNOSIS — R0609 Other forms of dyspnea: Principal | ICD-10-CM

## 2022-09-23 DIAGNOSIS — I10 Essential (primary) hypertension: Principal | ICD-10-CM

## 2022-09-23 DIAGNOSIS — M3219 Other organ or system involvement in systemic lupus erythematosus: Principal | ICD-10-CM

## 2022-09-24 DIAGNOSIS — M3212 Pericarditis in systemic lupus erythematosus: Principal | ICD-10-CM

## 2022-09-24 DIAGNOSIS — G8929 Other chronic pain: Principal | ICD-10-CM

## 2022-09-24 MED ORDER — OXYCODONE 5 MG TABLET
ORAL_TABLET | Freq: Every day | ORAL | 0 refills | 30 days
Start: 2022-09-24 — End: ?

## 2022-09-24 NOTE — Unmapped (Addendum)
Thank you for coming to see [place] today at [facility] Cardiology at [facility]. Please call us at [phone] if you have any questions.   ------------  We will look for any plaque build up / blockages in your heart arteries with a cardiac CT scan.     If nobody calls you to schedule your CT scan within 3 days, please call [phone] to schedule the scan at the [facility] facility.    --------------

## 2022-09-24 NOTE — Unmapped (Signed)
Reason for call: Dr. Holley Raring-- [phone] is requesting for a call back from a nurse or Delorise Shiner or Dr. Marland Mcalpine. Provider Calling to review clinical infor for disability. Thanks!       Last ov: 07/25/2022  Next ov: 10/21/2022

## 2022-09-25 DIAGNOSIS — G8929 Other chronic pain: Principal | ICD-10-CM

## 2022-09-25 DIAGNOSIS — Z3041 Encounter for surveillance of contraceptive pills: Principal | ICD-10-CM

## 2022-09-25 DIAGNOSIS — R1319 Other dysphagia: Principal | ICD-10-CM

## 2022-09-25 DIAGNOSIS — M7918 Myalgia, other site: Principal | ICD-10-CM

## 2022-09-25 DIAGNOSIS — I10 Essential (primary) hypertension: Principal | ICD-10-CM

## 2022-09-25 DIAGNOSIS — L299 Pruritus, unspecified: Principal | ICD-10-CM

## 2022-09-25 MED ORDER — LOSARTAN 50 MG TABLET
ORAL_TABLET | Freq: Every day | ORAL | 3 refills | 90 days | Status: CP
Start: 2022-09-25 — End: 2023-09-25
  Filled 2022-09-29: qty 90, 90d supply, fill #0

## 2022-09-25 MED ORDER — NORETHINDRONE ACETATE 1 MG-ETHINYL ESTRADIOL 20 MCG TABLET
ORAL_TABLET | Freq: Every day | ORAL | 3 refills | 84.00000 days | Status: CP
Start: 2022-09-25 — End: 2023-09-25

## 2022-09-25 MED ORDER — OMEPRAZOLE 20 MG CAPSULE,DELAYED RELEASE
ORAL_CAPSULE | Freq: Every day | ORAL | 3 refills | 90 days | Status: CP
Start: 2022-09-25 — End: 2023-09-25
  Filled 2022-09-29: qty 90, 90d supply, fill #0

## 2022-09-25 MED ORDER — HYDROXYZINE HCL 25 MG TABLET
ORAL_TABLET | Freq: Three times a day (TID) | ORAL | 3 refills | 30 days | PRN
Start: 2022-09-25 — End: ?

## 2022-09-25 MED ORDER — OXYCODONE 5 MG TABLET
ORAL_TABLET | Freq: Every day | ORAL | 0 refills | 30 days
Start: 2022-09-25 — End: ?

## 2022-09-25 MED ORDER — TIZANIDINE 2 MG TABLET
ORAL_TABLET | Freq: Three times a day (TID) | ORAL | 3 refills | 30 days | PRN
Start: 2022-09-25 — End: 2023-01-23

## 2022-09-25 NOTE — Unmapped (Addendum)
Clinical Assessment Needed For: Formulation Change  Medication: mycophenolate 500 mg  Last Fill Date/Day Supply: 08/14/22 / 30  Copay $0  Was previous dose already scheduled to fill: Yes 2/12        Notes to Pharmacist: None

## 2022-09-25 NOTE — Unmapped (Signed)
Duplicate. Pended in other encounter

## 2022-09-25 NOTE — Unmapped (Signed)
[facility] Pharmacist has reviewed a new prescription for Mycophenolate 500mg  that indicates a dose change from Myfortic.  Patient was counseled on this dosage change by Geoffry Paradise CPP- see epic note from 09/22/22.    [facility]   Patient Onboarding/Medication Counseling    Julia Bates is a 37 y.o. female with SLE who I am counseling today on continuation of therapy.  I am speaking to the patient.    Was a Nurse, learning disability used for this call? No    Verified patient's date of birth / HIPAA.    Specialty medication(s) to be sent: Inflammatory Disorders: mycophenolate      Non-specialty medications/supplies to be sent: N/A      Medications not needed at this time: N/A       The patient declined counseling on medication administration, missed dose instructions, goals of therapy, side effects and monitoring parameters, warnings and precautions, drug/food interactions, and storage, handling precautions, and disposal because they have taken the medication previously. The information in the declined sections below are for informational purposes only and was not discussed with patient.     Patient was taking Cellcept 1000 mg BID previously but wanted to try Myfortic due to chest discomfort/fullness/reflux symptoms with Cellcept. After discussion with patient, agreed to change back to Cellcept 1000 mg BID- Progress note 09/22/22     Cellcept (mycopheonlate mofetil)    Medication & Administration     Dosage: Take 2 tablets (1000mg ) by mouth two times daily    Administration:   Take by mouth with or without food.   Taking with food can minimize GI side effects.   Swallow capsules whole, do not crush or chew.  Oral suspension should be shaken well prior to administration.  Do not mix with other medications and discard any unused portion 60 days after constitution.      Adherence/Missed dose instructions:  Take a missed dose as soon as you remember it . If it is close to the time of your next dose, skip the missed dose and resume your normal schedule.Never take 2 doses to try and catch up from a missed dose.    Goals of Therapy     Reduce immune response    Side Effects & Monitoring Parameters     Feeling tired or weak  Shakiness  Trouble sleeping  Diarrhea, abdominal pain, nausea, vomiting, constipation or decreased appetite  Decreases in blood counts   Back or joint pain  Hypertension or hypotension  High blood sugar  Headache  Skin rash    The following side effects should be reported to the provider:  Reduced immune function - report signs of infection such as fever; chills; body aches; very bad sore throat; ear or sinus pain; cough; more sputum or change in color of sputum; pain with passing urine; wound that will not heal, etc.  Also at a slightly higher risk of some malignancies (mainly skin and blood cancers) due to this reduced immune function.  Allergic reaction (rash, hives, swelling, shortness of breath)  High blood sugar (confusion, feeling sleepy, more thirst, more hungry, passing urine more often, flushing, fast breathing, or breath that smells like fruit)  Electrolyte issues (mood changes, confusion, muscle pain or weakness, a heartbeat that does not feel normal, seizures, not hungry, or very bad upset stomach or throwing up)  High or low blood pressure (bad headache or dizziness, passing out, or change in eyesight)  Kidney issues (unable to pass urine, change in how much urine  is passed, blood in the urine, or a big weight gain)  Skin (oozing, heat, swelling, redness, or pain), UTI and other infections   Chest pain or pressure  Abnormal heartbeat  Unexplained bleeding or bruising  Abnormal burning, numbness, or tingling  Muscle cramps,  Yellowing of skin or eyes    Monitoring parameters  Pregnancy   CBC   Renal and hepatic function    Contraindications, Warnings, & Precautions     *This is a REMS drug and an FDA-approved patient medication guide will be printed with each dispensation  Black Box Warning: Infections Black Box Warning: Lymphoproliferative disorders - risk of development of lymphoma and skin malignancy is increased  Black Box Warning: Use during pregnancy is associated with increased risks of first trimester pregnancy loss and congenital malformations.   Black Box Warning: Females of reproductive potential should use contraception during treatment and for 6 weeks after therapy is discontinued  Is patient using an effective method of contraception? Yes  If yes, method of contraception:  OCP  CNS depression  New or reactivated viral infections  Neutropenia  Female patients and/or their female partners should use effective contraception during treatment of the female patient and for at least 3 months after last dose.  Breastfeeding is not recommended during therapy and for 6 weeks after last dose    Drug/Food Interactions     Medication list reviewed in Epic. The patient was instructed to inform the care team before taking any new medications or supplements. No drug interactions identified.   Separate doses of antacids and this medication  Check with your doctor before getting any vaccinations    Storage, Handling Precautions, & Disposal     Store at room temperature in a dry place  This medication is considered hazardous. Wash hands after handling and store out of reach or others, including children and pets.      Current Medications (including OTC/herbals), Comorbidities and Allergies     Current Outpatient Medications   Medication Sig Dispense Refill    apixaban (ELIQUIS) 5 mg Tab Take 1 tablet (5 mg total) by mouth two (2) times a day. 180 tablet 3    calcium carbonate-vitamin D3 600 mg(1,500mg ) -200 unit per tablet Take 1 tablet by mouth Two (2) times a day.      hydroxychloroquine (PLAQUENIL) 200 mg tablet Take 1.5 tablets (300 mg total) by mouth daily. If unable to tolerate the smell of splitting tab - can take 200mg  on MWF, 400mg  other days of the week 45 tablet 11    hydrOXYzine (ATARAX) 25 MG tablet Take 1 tablet (25 mg total) by mouth every eight (8) hours as needed. 90 tablet 0    losartan (COZAAR) 50 MG tablet Take 1 tablet (50 mg total) by mouth daily. 90 tablet 3    melatonin 5 mg tablet Take 1 tablet (5 mg total) by mouth nightly.      miscellaneous medical supply Misc Pt needs a set of crutches. 1 each 0    mycophenolate (CELLCEPT) 500 mg tablet Take 2 tablets (1,000 mg total) by mouth two (2) times a day. Stopping Myfortic, resume MMF 360 tablet 3    norethindrone-ethinyl estradiol (MICROGESTIN 1/20) 1-20 mg-mcg per tablet Take 1 tablet by mouth daily. 84 tablet 0    omeprazole (PRILOSEC) 20 MG capsule Take 1 capsule (20 mg total) by mouth daily. 90 capsule 3    oxyCODONE (ROXICODONE) 5 MG immediate release tablet Take 1 tablet (5 mg total) by mouth  in the morning. 30 tablet 0    predniSONE (DELTASONE) 1 MG tablet TAKE 1 TABLET BY MOUTH EVERY DAY (Patient taking differently: Take 5 tablets (5 mg total) by mouth daily.) 30 tablet 1    predniSONE (DELTASONE) 5 MG tablet Take 1 tablet (5 mg total) by mouth daily. Taper by 10mg  every week as previously planned. 90 tablet 0    tizanidine (ZANAFLEX) 2 MG tablet Take 1 tablet (2 mg total) by mouth every eight (8) hours as needed. 90 tablet 0     No current facility-administered medications for this visit.       Allergies   Allergen Reactions    Sulfa (Sulfonamide Antibiotics) Nausea And Vomiting and Other (See Comments)     Other reaction(s): FEVER       Patient Active Problem List   Diagnosis    Cataract    Systemic lupus erythematosus (CMS-HCC)    Neoplasm of connective tissue    Disorder of bone and cartilage    Transaminitis    Cervical dysplasia    Rheumatoid arthritis (CMS-HCC)    Chest pain on breathing    Neutropenia (CMS-HCC)    Rash    Essential hypertension       Reviewed and up to date in Epic.    Appropriateness of Therapy     Acute infections noted within Epic:  No active infections  Patient reported infection: None    Is medication and dose appropriate based on diagnosis and infection status? Yes    Prescription has been clinically reviewed: Yes      Baseline Quality of Life Assessment      How many days over the past month did your SLE  keep you from your normal activities? For example, brushing your teeth or getting up in the morning. 0    Financial Information     Medication Assistance provided: None Required    Anticipated copay of $0/90 reviewed with patient. Verified delivery address.    Delivery Information     Scheduled delivery date: 09/30/22    Expected start date: 09/30/22    Patient was notified of new phone menu: Yes: patient aware    Medication will be delivered via UPS to the prescription address in Epic [place].  This shipment will not require a signature.      Explained the services we provide at [facility] and that each month we would call to set up refills.  Stressed importance of returning phone calls so that we could ensure they receive their medications in time each month.  Informed patient that we should be setting up refills 7-10 days prior to when they will run out of medication.  A pharmacist will reach out to perform a clinical assessment periodically.  Informed patient that a welcome packet, containing information about our pharmacy and other support services, a Notice of Privacy Practices, and a drug information handout will be sent.      The patient or caregiver noted above participated in the development of this care plan and knows that they can request review of or adjustments to the care plan at any time.      Patient or caregiver verbalized understanding of the above information as well as how to contact the pharmacy at [phone] option 4 with any questions/concerns.  The pharmacy is open Monday through Friday 8:30am-4:30pm.  A pharmacist is available 24/7 via pager to answer any clinical questions they may have.    Patient Specific Needs  Does the patient have any physical, cognitive, or cultural barriers? No    Does the patient have adequate living arrangements? (i.e. the ability to store and take their medication appropriately) Yes    Did you identify any home environmental safety or security hazards? No    Patient prefers to have medications discussed with  Patient     Is the patient or caregiver able to read and understand education materials at a high school level or above? Yes    Patient's primary language is  English     Is the patient high risk? No    SOCIAL DETERMINANTS OF HEALTH     At the [facility], we have learned that life circumstances - like trouble affording food, housing, utilities, or transportation can affect the health of many of our patients.   That is why we wanted to ask: are you currently experiencing any life circumstances that are negatively impacting your health and/or quality of life? Patient declined to answer    Social Determinants of Health     Financial Resource Strain: Low Risk  (07/11/2022)    Overall Financial Resource Strain (CARDIA)     Difficulty of Paying Living Expenses: Not hard at all   Internet Connectivity: No Internet connectivity concern identified (06/04/2022)    Internet Connectivity     Do you have access to internet services: Yes     How do you connect to the internet: Personal Device at home     Is your internet connection strong enough for you to watch video on your device without major problems?: Yes     Do you have enough data to get through the month?: Yes     Does at least one of the devices have a camera that you can use for video chat?: Yes   Food Insecurity: No Food Insecurity (07/11/2022)    Hunger Vital Sign     Worried About Running Out of Food in the Last Year: Never true     Ran Out of Food in the Last Year: Never true   Tobacco Use: Low Risk  (09/23/2022)    Patient History     Smoking Tobacco Use: Never     Smokeless Tobacco Use: Never     Passive Exposure: Not on file   Housing/Utilities: Low Risk  (07/11/2022)    Housing/Utilities Within the past 12 months, have you ever stayed: outside, in a car, in a tent, in an overnight shelter, or temporarily in someone else's home (i.e. couch-surfing)?: No     Are you worried about losing your housing?: No     Within the past 12 months, have you been unable to get utilities (heat, electricity) when it was really needed?: No   Alcohol Use: Not At Risk (08/15/2022)    Alcohol Use     How often do you have a drink containing alcohol?: Never     How many drinks containing alcohol do you have on a typical day when you are drinking?: 1 - 2     How often do you have 5 or more drinks on one occasion?: Never   Transportation Needs: No Transportation Needs (07/11/2022)    PRAPARE - Transportation     Lack of Transportation (Medical): No     Lack of Transportation (Non-Medical): No   Substance Use: Low Risk  (08/15/2022)    Substance Use     Taken prescription drugs for non-medical reasons: Never     Taken illegal drugs: Never  Patient indicated they have taken drugs in the past year for non-medical reasons: Yes, [positive answer(s)]: Not on file   Health Literacy: Not on file   Physical Activity: Not on file   Interpersonal Safety: Not at risk (06/04/2022)    Interpersonal Safety     Unsafe Where You Currently Live: No     Physically Hurt by Anyone: No     Abused by Anyone: No   Stress: No Stress Concern Present (06/04/2022)    [employer] of Occupational Health - Occupational Stress Questionnaire     Feeling of Stress : Not at all   Intimate Partner Violence: Not At Risk (10/30/2021)    Humiliation, Afraid, Rape, and Kick questionnaire     Fear of Current or Ex-Partner: No     Emotionally Abused: No     Physically Abused: No     Sexually Abused: No   Depression: Not at risk (07/10/2020)    PHQ-2     PHQ-2 Score: 0   Social Connections: Not on file       Would you be willing to receive help with any of the needs that you have identified today? Not applicable       Sherral Hammers, PharmD  [facility] Specialty Pharmacist

## 2022-09-25 NOTE — Unmapped (Signed)
Tizanidine, oxycodone and hydroxyzine refill  Last ov: 07/25/2022  Next ov: 10/21/2022

## 2022-09-26 DIAGNOSIS — M329 Systemic lupus erythematosus, unspecified: Principal | ICD-10-CM

## 2022-09-26 DIAGNOSIS — L299 Pruritus, unspecified: Principal | ICD-10-CM

## 2022-09-26 DIAGNOSIS — M7918 Myalgia, other site: Principal | ICD-10-CM

## 2022-09-26 DIAGNOSIS — Z3041 Encounter for surveillance of contraceptive pills: Principal | ICD-10-CM

## 2022-09-26 MED ORDER — PREDNISONE 5 MG TABLET
ORAL_TABLET | Freq: Every day | ORAL | 0 refills | 90 days
Start: 2022-09-26 — End: 2022-12-25

## 2022-09-26 MED ORDER — NORETHINDRONE ACETATE 1 MG-ETHINYL ESTRADIOL 20 MCG TABLET
ORAL_TABLET | Freq: Every day | ORAL | 0 refills | 84.00000 days
Start: 2022-09-26 — End: 2023-09-26

## 2022-09-26 MED ORDER — HYDROXYZINE HCL 25 MG TABLET
ORAL_TABLET | Freq: Three times a day (TID) | ORAL | 0 refills | 30 days | PRN
Start: 2022-09-26 — End: ?

## 2022-09-26 MED ORDER — OXYCODONE 5 MG TABLET
ORAL_TABLET | Freq: Every day | ORAL | 0 refills | 30 days
Start: 2022-09-26 — End: ?

## 2022-09-26 MED ORDER — TIZANIDINE 2 MG TABLET
ORAL_TABLET | Freq: Three times a day (TID) | ORAL | 0 refills | 30 days | PRN
Start: 2022-09-26 — End: 2023-01-24

## 2022-09-26 NOTE — Unmapped (Signed)
I sent a 3 mth Rx supply to the pharmacy with refills.

## 2022-09-29 DIAGNOSIS — G8929 Other chronic pain: Principal | ICD-10-CM

## 2022-09-29 MED ORDER — HYDROXYZINE HCL 25 MG TABLET
ORAL_TABLET | Freq: Three times a day (TID) | ORAL | 0 refills | 30 days | PRN
Start: 2022-09-29 — End: ?

## 2022-09-29 MED ORDER — OXYCODONE 5 MG TABLET
ORAL_TABLET | Freq: Every day | ORAL | 0 refills | 30 days
Start: 2022-09-29 — End: ?

## 2022-09-29 MED ORDER — PREDNISONE 5 MG TABLET
ORAL_TABLET | Freq: Every day | ORAL | 0 refills | 90 days
Start: 2022-09-29 — End: 2022-12-28

## 2022-09-29 MED ORDER — TIZANIDINE 2 MG TABLET
ORAL_TABLET | Freq: Three times a day (TID) | ORAL | 0 refills | 30 days | PRN
Start: 2022-09-29 — End: 2023-01-27

## 2022-09-30 ENCOUNTER — Ambulatory Visit: Admit: 2022-09-30 | Discharge: 2022-09-30 | Disposition: A | Discharge status: Home / Self Care | Payer: MEDICAID

## 2022-09-30 ENCOUNTER — Emergency Department: Admit: 2022-09-30 | Discharge: 2022-09-30 | Disposition: A | Discharge status: Home / Self Care | Payer: MEDICAID

## 2022-09-30 LAB — PREGNANCY, URINE: PREGNANCY TEST URINE: NEGATIVE

## 2022-09-30 MED ORDER — HYDROXYZINE HCL 25 MG TABLET
ORAL_TABLET | Freq: Three times a day (TID) | ORAL | 3 refills | 30 days | Status: CP | PRN
Start: 2022-09-30 — End: ?
  Filled 2022-10-03: qty 90, 30d supply, fill #0

## 2022-09-30 MED ORDER — GABAPENTIN 300 MG CAPSULE
ORAL_CAPSULE | Freq: Three times a day (TID) | ORAL | 0 refills | 10.00000 days | Status: CP
Start: 2022-09-30 — End: 2022-09-30

## 2022-09-30 MED ORDER — TIZANIDINE 2 MG TABLET
ORAL_TABLET | Freq: Three times a day (TID) | ORAL | 3 refills | 30 days | Status: CP | PRN
Start: 2022-09-30 — End: 2023-01-28
  Filled 2022-10-03: qty 90, 30d supply, fill #0

## 2022-09-30 MED ORDER — OXYCODONE 5 MG TABLET
ORAL_TABLET | Freq: Every day | ORAL | 0 refills | 30.00000 days | Status: CP
Start: 2022-09-30 — End: ?

## 2022-09-30 MED ADMIN — acetaminophen (TYLENOL) tablet 650 mg: 650 mg | ORAL | @ 23:00:00 | Stop: 2022-09-30

## 2022-09-30 MED ADMIN — gabapentin (NEURONTIN) capsule 300 mg: 300 mg | ORAL | @ 23:00:00 | Stop: 2022-09-30
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
## 2022-09-30 NOTE — Unmapped (Signed)
Oxycodone refill  Last ov: 07/25/2022  Next ov: 10/21/2022

## 2022-10-01 MED ORDER — OXYCODONE 5 MG TABLET
ORAL_TABLET | Freq: Every day | ORAL | 0 refills | 30 days | Status: CP
Start: 2022-10-01 — End: ?

## 2022-10-01 NOTE — Unmapped (Signed)
Oxycodone refill (different pharmacy)  Last ov: 07/25/2022  Next ov: 09/26/2022
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
## 2022-10-01 NOTE — Unmapped (Signed)
Addended by: Brennan Bailey E on: 10/01/2022 10:11 AM     Modules accepted: Orders

## 2022-10-01 NOTE — Unmapped (Signed)
Addended by: Rolly Salter on: 09/30/2022 04:48 PM     Modules accepted: Orders

## 2022-10-01 NOTE — Unmapped (Signed)
Spoke with [facility] and they verified they don't prescribe narcotics due to shipping medication. Instructed  [facility] to discontinue oxycodone prescription that was sent in yesterday by Dr. Marland Mcalpine. Sent updated prescription to local [facility] so pt can pick up.     Brennan Bailey, FNP

## 2022-10-13 NOTE — Unmapped (Signed)
[facility] Clinical Assessment     Julia Bates, [place]: 26-Aug-1985  Phone: [phone] (home)     All above HIPAA information was verified with patient.     Was a Nurse, learning disability used for this call? No    Specialty Medication(s):   Inflammatory Disorders: mycophenolate     Current Outpatient Medications   Medication Sig Dispense Refill    apixaban (ELIQUIS) 5 mg Tab Take 1 tablet (5 mg total) by mouth two (2) times a day. 180 tablet 3    calcium carbonate-vitamin D3 600 mg(1,500mg ) -200 unit per tablet Take 1 tablet by mouth Two (2) times a day.      gabapentin (NEURONTIN) 300 MG capsule Take 1 capsule (300 mg total) by mouth Three (3) times a day for 10 days. 30 capsule 0    hydroxychloroquine (PLAQUENIL) 200 mg tablet Take 1.5 tablets (300 mg total) by mouth daily. If unable to tolerate the smell of splitting tab - can take 200mg  on MWF, 400mg  other days of the week 45 tablet 11    hydrOXYzine (ATARAX) 25 MG tablet Take 1 tablet (25 mg total) by mouth every eight (8) hours as needed. 90 tablet 3    losartan (COZAAR) 50 MG tablet Take 1 tablet (50 mg total) by mouth daily. 90 tablet 3    melatonin 5 mg tablet Take 1 tablet (5 mg total) by mouth nightly.      miscellaneous medical supply Misc Pt needs a set of crutches. 1 each 0    mycophenolate (CELLCEPT) 500 mg tablet Take 2 tablets (1,000 mg total) by mouth two (2) times a day. Stopping Myfortic, resume MMF 360 tablet 3    norethindrone-ethinyl estradiol (MICROGESTIN 1/20) 1-20 mg-mcg per tablet Take 1 tablet by mouth daily. 84 tablet 3    omeprazole (PRILOSEC) 20 MG capsule Take 1 capsule (20 mg total) by mouth daily. 90 capsule 3    oxyCODONE (ROXICODONE) 5 MG immediate release tablet Take 1 tablet (5 mg total) by mouth in the morning. 30 tablet 0    predniSONE (DELTASONE) 1 MG tablet TAKE 1 TABLET BY MOUTH EVERY DAY (Patient taking differently: Take 5 tablets (5 mg total) by mouth daily.) 30 tablet 1    predniSONE (DELTASONE) 5 MG tablet Take 1 tablet (5 mg total) by mouth daily. Taper by 10mg  every week as previously planned. 90 tablet 0    tizanidine (ZANAFLEX) 2 MG tablet Take 1 tablet (2 mg total) by mouth every eight (8) hours as needed. 90 tablet 3     No current facility-administered medications for this visit.        Changes to medications: Julia Bates reports no changes at this time.    Allergies   Allergen Reactions    Sulfa (Sulfonamide Antibiotics) Nausea And Vomiting and Other (See Comments)     Other reaction(s): FEVER       Changes to allergies: No    SPECIALTY MEDICATION ADHERENCE     mycophenolate 500 mg: 75 days of medicine on hand            Specialty medication(s) dose(s) confirmed: Regimen is correct and unchanged.     Are there any concerns with adherence? No    Adherence counseling provided? Not needed    CLINICAL MANAGEMENT AND INTERVENTION      Clinical Benefit Assessment:    Do you feel the medicine is effective or helping your condition? Yes    Clinical Benefit counseling provided? Not  needed    Adverse Effects Assessment:    Are you experiencing any side effects? No    Are you experiencing difficulty administering your medicine? No    Quality of Life Assessment:    Quality of Life    Rheumatology  Oncology  Dermatology  Cystic Fibrosis          How many days over the past month did your condition  keep you from your normal activities? For example, brushing your teeth or getting up in the morning. 0    Have you discussed this with your provider? Not needed    Acute Infection Status:    Acute infections noted within Epic:  No active infections  Patient reported infection: None    Therapy Appropriateness:    Is therapy appropriate and patient progressing towards therapeutic goals? Yes, therapy is appropriate and should be continued    DISEASE/MEDICATION-SPECIFIC INFORMATION      N/A    Chronic Inflammatory Diseases: Have you experienced any flares in the last month? No    PATIENT SPECIFIC NEEDS     Does the patient have any physical, cognitive, or cultural barriers? No    Is the patient high risk? No    Did the patient require a clinical intervention? No    Does the patient require physician intervention or other additional services (i.e., nutrition, smoking cessation, social work)? No    SOCIAL DETERMINANTS OF HEALTH     At the [facility], we have learned that life circumstances - like trouble affording food, housing, utilities, or transportation can affect the health of many of our patients.   That is why we wanted to ask: are you currently experiencing any life circumstances that are negatively impacting your health and/or quality of life? No    Social Determinants of Health     Financial Resource Strain: Low Risk  (07/11/2022)    Overall Financial Resource Strain (CARDIA)     Difficulty of Paying Living Expenses: Not hard at all   Internet Connectivity: No Internet connectivity concern identified (06/04/2022)    Internet Connectivity     Do you have access to internet services: Yes     How do you connect to the internet: Personal Device at home     Is your internet connection strong enough for you to watch video on your device without major problems?: Yes     Do you have enough data to get through the month?: Yes     Does at least one of the devices have a camera that you can use for video chat?: Yes   Food Insecurity: No Food Insecurity (07/11/2022)    Hunger Vital Sign     Worried About Running Out of Food in the Last Year: Never true     Ran Out of Food in the Last Year: Never true   Tobacco Use: Low Risk  (09/23/2022)    Patient History     Smoking Tobacco Use: Never     Smokeless Tobacco Use: Never     Passive Exposure: Not on file   Housing/Utilities: Low Risk  (07/11/2022)    Housing/Utilities     Within the past 12 months, have you ever stayed: outside, in a car, in a tent, in an overnight shelter, or temporarily in someone else's home (i.e. couch-surfing)?: No     Are you worried about losing your housing?: No Within the past 12 months, have you been unable to get utilities (heat, electricity) when it was  really needed?: No   Alcohol Use: Not At Risk (08/15/2022)    Alcohol Use     How often do you have a drink containing alcohol?: Never     How many drinks containing alcohol do you have on a typical day when you are drinking?: 1 - 2     How often do you have 5 or more drinks on one occasion?: Never   Transportation Needs: No Transportation Needs (07/11/2022)    PRAPARE - Transportation     Lack of Transportation (Medical): No     Lack of Transportation (Non-Medical): No   Substance Use: Low Risk  (08/15/2022)    Substance Use     Taken prescription drugs for non-medical reasons: Never     Taken illegal drugs: Never     Patient indicated they have taken drugs in the past year for non-medical reasons: Yes, [positive answer(s)]: Not on file   Health Literacy: Not on file   Physical Activity: Not on file   Interpersonal Safety: Not at risk (06/04/2022)    Interpersonal Safety     Unsafe Where You Currently Live: No     Physically Hurt by Anyone: No     Abused by Anyone: No   Stress: No Stress Concern Present (06/04/2022)    [employer] of Occupational Health - Occupational Stress Questionnaire     Feeling of Stress : Not at all   Intimate Partner Violence: Not At Risk (10/30/2021)    Humiliation, Afraid, Rape, and Kick questionnaire     Fear of Current or Ex-Partner: No     Emotionally Abused: No     Physically Abused: No     Sexually Abused: No   Depression: Not at risk (07/10/2020)    PHQ-2     PHQ-2 Score: 0   Social Connections: Not on file       Would you be willing to receive help with any of the needs that you have identified today? Not applicable       SHIPPING     Specialty Medication(s) to be Shipped:   N/a     Changes to insurance: No      The patient will receive a drug information handout for each medication shipped and additional FDA Medication Guides as required.  Verified that patient has previously received a Conservation officer, historic buildings and a Surveyor, mining.    The patient or caregiver noted above participated in the development of this care plan and knows that they can request review of or adjustments to the care plan at any time.      All of the patient's questions and concerns have been addressed.    Julianne Rice, PharmD   [facility] Specialty Pharmacist

## 2022-10-14 NOTE — Unmapped (Signed)
Assessment and Plan:     Essential hypertension  BP well controlled 118/82 in clinic today. Discussed medication options. At last visit increased losartan 25 mg --> 50 mg daily, continue. Reviewed low sodium diet and encouraged regular exercise. Advised to continue to monitor and log at-home BP readings. Contact clinic if readings become elevated.     Patient with a history of systemic lupus erythematosus present with a swollen thumb since 10/15/22. She has been to the ED and rheumatology for this problem.Counseled patient on importance to follow with rheumatology and hematology. Recommended patient continue doxycycline and prednisone taper until therapy is complete. If symptoms worsen or persist recommend contact this provider.     I personally spent 25 minutes face-to-face and non-face-to-face in the care of this patient, which includes all pre, intra, and post visit time on the date of service.    Return in about 6 months (around 04/30/2023) for Next scheduled follow up.    HPI:      Julia Bates is here for   Chief Complaint   Patient presents with    Hypertension     Recheck BP.    Finger Swelling     RT thumb swollen since 10/15/2022.  Thought it was related to her Lupus.  Rheumatologist said possible MRSA or part of her DVT.    Immunizations     Declines flu and Prevnar.     Since the patient was last seen she visited the ED for swelling of her right thumb, the ED told her it was Lupus related. They performed and x-ray at the ED. 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 She was placed on doxycycline 100 mg BID and a prednisone taper, she is on the last few days of therapy. She has a follow up with rheumatology on April 2nd The patient has also continued to follow with rheumatology and cardiovascular disease. In clinic today she has no feeling in the thumb, besides tenderness to palpation, the thumb is cooler to touch than other fingers, the thumb is also discolored.    She was on infusion for lupus for over a year with no help. Her rheumatologist recommended to restart the infusion, however, patient is hesitant.    Hypertension: Patient presents for follow-up of hypertension. Blood pressure goal < 140/90.  Hypertension has customarily not been at goal complicated by recent respiratory illness and decongestant use.  Home blood pressure readings: Her blood pressure has been running high. Salt intake and diet: salt not added to cooking. Associated signs and symptoms:  dizziness . Patient denies: blurred vision, chest pain, dyspnea, headache, neck aches, orthopnea, palpitations, paroxysmal nocturnal dyspnea, peripheral edema, pulsating in the ears, and tiredness/fatigue. Medication compliance: taking as prescribed. She is not doing regular exercise.     Endorses chest pains at night when lying flat, SOB with exertion, edema of ankles, itchiness. Denies HA, fever, shortness of breath at rest, N/V/D, bowel or bladder issues, vision or hearing changes.       ROS:      Comprehensive 10 point ROS negative unless otherwise stated in the HPI.      [facility] Components:     Medication adherence and barriers to the treatment plan have been addressed. Opportunities to optimize healthy behaviors have been discussed. Patient / caregiver voiced understanding.    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    ECC pos CIN 1--12/19    Essential hypertension 09/23/2022    GERD (gastroesophageal reflux disease) Oct 23    HPV (human papilloma virus) infection     Lupus (CMS-HCC)     Lupus (CMS-HCC)     Rheumatoid arthritis (CMS-HCC) 11/18/2015    SLE (systemic lupus erythematosus related syndrome) (CMS-HCC)      Past Surgical History: Procedure Laterality Date    CERVICAL BIOPSY  W/ LOOP ELECTRODE EXCISION      COLPOSCOPY      SKIN BIOPSY  2013-2014       Family History:     Family History   Problem Relation Age of Onset    Glaucoma Father     Diabetes Father     Hypertension Father     No Known Problems Mother     No Known Problems Sister     Diabetes Sister     Diabetes Sister     Melanoma Neg Hx     Basal cell carcinoma Neg Hx     Squamous cell carcinoma Neg Hx     Macular degeneration Neg Hx     Blindness Neg Hx        Social History:     Social History     Tobacco Use    Smoking status: Never    Smokeless tobacco: Never    Tobacco comments:     None   Vaping Use    Vaping status: Never Used   Substance Use Topics    Alcohol use: Never    Drug use: Never       Allergies:     Sulfa (sulfonamide antibiotics)    Current Medications:     Current Outpatient Medications   Medication Sig Dispense Refill    apixaban (ELIQUIS) 5 mg Tab Take 1 tablet (5 mg total) by mouth two (2) times a day. 180 tablet 3    calcium carbonate-vitamin D3 600 mg(1,500mg ) -200 unit per tablet Take 1 tablet by mouth Two (2) times a day.      hydroxychloroquine (PLAQUENIL) 200 mg tablet Take 1.5 tablets (300 mg total) by mouth daily. If unable to tolerate the smell of splitting tab - can take 200mg  on MWF, 400mg  other days of the week 45 tablet 11    hydrOXYzine (ATARAX) 25 MG tablet Take 1 tablet (25 mg total) by mouth every eight (8) hours as needed. 90 tablet 3    losartan (COZAAR) 50 MG tablet Take 1 tablet (50 mg total) by mouth daily. 90 tablet 3    melatonin 5 mg tablet Take 1 tablet (5 mg total) by mouth nightly.      mycophenolate (CELLCEPT) 500 mg tablet Take 2 tablets (1,000 mg total) by mouth two (2) times a day. Stopping Myfortic, resume MMF 360 tablet 3    norethindrone (MICRONOR) 0.35 mg tablet Take 1 tablet by mouth daily. 84 tablet 3    omeprazole (PRILOSEC) 20 MG capsule Take 1 capsule (20 mg total) by mouth daily. 90 capsule 3    [START ON 10/31/2022] oxyCODONE (ROXICODONE) 5 MG immediate release tablet Take 1 tablet (5 mg total) by mouth in the morning. 30 tablet 0    predniSONE (DELTASONE) 5 MG tablet Take 3  tablets (15 mg total) by mouth daily for 3 days, THEN 2 tablets (10 mg total) daily for 3 days, THEN 1 tablet (5 mg total) daily. Taper by 10mg  every week as previously planned.. 60 tablet 0    tizanidine (ZANAFLEX) 2 MG tablet Take 1 tablet (2 mg total) by mouth every eight (8) hours as needed. 90 tablet 3    miscellaneous medical supply Misc Pt needs a set of crutches. (Patient not taking: Reported on 10/28/2022) 1 each 0     No current facility-administered medications for this visit.       Health Maintenance:     Health Maintenance   Topic Date Due    COVID-19 Vaccine (3 - Pfizer risk series) 10/27/2020    Influenza Vaccine (1) 04/11/2022    Pneumococcal Vaccine 0-64 (3 of 3 - PPSV23 or PCV20) 10/28/2023 (Originally 08/06/2020)    Potassium Monitoring  10/17/2023    Serum Creatinine Monitoring  10/21/2023    DTaP/Tdap/Td Vaccines (4 - Td or Tdap) 04/09/2025    HPV Cotest with Pap Smear (21-65)  11/03/2026    Pap Smear with Cotest HPV (21-65)  11/13/2026    Hepatitis C Screen  Completed       Immunizations:     Immunization History   Administered Date(s) Administered    COVID-19 VAC,MRNA,TRIS(12Y UP)(PFIZER)(GRAY CAP) 09/08/2020    COVID-19 VACC,MRNA,(PFIZER)(PF) 09/29/2020    DTP 12/08/1990    HPV Quadrivalent (Gardasil) 03/24/2006, 06/01/2006, 10/02/2006    Influenza Vaccine Quad(IM)6 MO-Adult(PF) 06/05/2015, 05/06/2016, 07/07/2017, 07/06/2018, 04/29/2019, 05/17/2020    Influenza Virus Vaccine, unspecified formulation 05/06/2016, 07/07/2017    MMR 12/08/1990    Meningococcal C Conjugate 03/08/2004    Novel Influenza-h1n1-09, All Formulations 07/20/2008    PNEUMOCOCCAL POLYSACCHARIDE 23-VALENT 08/07/2015    PPD Test 05/20/2009    Pneumococcal Conjugate 13-Valent 04/10/2015    SHINGRIX-ZOSTER VACCINE (HZV),RECOMBINANT,ADJUVANTED(IM) 12/04/2021, 07/05/2022 TdaP 02/16/2012, 04/10/2015     I have reviewed and (if needed) updated the patient's problem list, medications, allergies, past medical and surgical history, social and family history.     Vital Signs:     Wt Readings from Last 3 Encounters:   10/28/22 61.7 kg (136 lb)   10/21/22 61.6 kg (135 lb 12.8 oz)   10/17/22 60.6 kg (133 lb 9.6 oz)     Temp Readings from Last 3 Encounters:   10/28/22 36.8 ??C (98.3 ??F) (Oral)   10/21/22 36.9 ??C (98.5 ??F) (Temporal)   10/17/22 36.4 ??C (97.5 ??F) (Oral)     BP Readings from Last 3 Encounters:   10/28/22 118/82   10/21/22 134/72   10/21/22 130/98     Pulse Readings from Last 3 Encounters:   10/28/22 81   10/21/22 99   10/21/22 87     Estimated body mass index is 27.47 kg/m?? as calculated from the following:    Height as of this encounter: 149.9 cm (4' 11).    Weight as of this encounter: 61.7 kg (136 lb).  Facility age limit for growth %iles is 20 years.      Objective:      General: Alert and oriented x3. Well-appearing. No acute distress. Cane for ambulation.  HEENT:  Normocephalic.  Atraumatic. Conjunctiva and sclera normal.   Neck:  Supple. No thyroid enlargement. No adenopathy.   Extremities:  Mild edema, hands and lower extremities.   Right thumb: Unable to flex. Purple discoloration present. Edema of distal end. Moderate TTP.  Skin:  Warm, dry. No rash or lesions present.  Neuro:  Non-focal. No obvious weakness.   Psych:  Affect normal, eye contact good, speech clear and coherent.        I attest that I, Arta Bruce, personally documented this note while acting as scribe for Noralyn Pick, FNP.      Arta Bruce, Scribe.  10/28/2022     The documentation recorded by the scribe accurately reflects the service I personally performed and the decisions made by me.     Noralyn Pick, FNP

## 2022-10-16 MED FILL — HYDROXYCHLOROQUINE 200 MG TABLET: ORAL | 30 days supply | Qty: 45 | Fill #2

## 2022-10-17 ENCOUNTER — Ambulatory Visit
Admit: 2022-10-17 | Discharge: 2022-10-18 | Disposition: A | Discharge status: Home / Self Care | Payer: MEDICAID | Attending: Student in an Organized Health Care Education/Training Program

## 2022-10-17 ENCOUNTER — Emergency Department
Admit: 2022-10-17 | Discharge: 2022-10-18 | Disposition: A | Discharge status: Home / Self Care | Payer: MEDICAID | Attending: Student in an Organized Health Care Education/Training Program

## 2022-10-17 DIAGNOSIS — R2231 Localized swelling, mass and lump, right upper limb: Principal | ICD-10-CM

## 2022-10-17 DIAGNOSIS — M79644 Pain in right finger(s): Principal | ICD-10-CM

## 2022-10-17 LAB — COMPREHENSIVE METABOLIC PANEL
ALBUMIN: 3.8 g/dL (ref 3.4–5.0)
ALKALINE PHOSPHATASE: 83 U/L (ref 46–116)
ALT (SGPT): 45 U/L (ref 10–49)
ANION GAP: 9 mmol/L (ref 5–14)
AST (SGOT): 49 U/L — ABNORMAL HIGH (ref <=34)
BILIRUBIN TOTAL: 0.5 mg/dL (ref 0.3–1.2)
BLOOD UREA NITROGEN: 11 mg/dL (ref 9–23)
BUN / CREAT RATIO: 12
CALCIUM: 9.3 mg/dL (ref 8.7–10.4)
CHLORIDE: 115 mmol/L — ABNORMAL HIGH (ref 98–107)
CO2: 21 mmol/L (ref 20.0–31.0)
CREATININE: 0.94 mg/dL
EGFR CKD-EPI (2021) FEMALE: 81 mL/min/{1.73_m2} (ref >=60)
GLUCOSE RANDOM: 88 mg/dL (ref 70–179)
POTASSIUM: 3.9 mmol/L (ref 3.4–4.8)
PROTEIN TOTAL: 7.1 g/dL (ref 5.7–8.2)
SODIUM: 145 mmol/L (ref 135–145)

## 2022-10-17 LAB — CBC W/ AUTO DIFF
BASOPHILS ABSOLUTE COUNT: 0.1 10*9/L (ref 0.0–0.1)
BASOPHILS RELATIVE PERCENT: 1 %
EOSINOPHILS ABSOLUTE COUNT: 0 10*9/L (ref 0.0–0.5)
EOSINOPHILS RELATIVE PERCENT: 0.1 %
HEMATOCRIT: 40.3 % (ref 34.0–44.0)
HEMOGLOBIN: 13.2 g/dL (ref 11.3–14.9)
LYMPHOCYTES ABSOLUTE COUNT: 1.7 10*9/L (ref 1.1–3.6)
LYMPHOCYTES RELATIVE PERCENT: 22.5 %
MEAN CORPUSCULAR HEMOGLOBIN CONC: 32.6 g/dL (ref 32.0–36.0)
MEAN CORPUSCULAR HEMOGLOBIN: 28.1 pg (ref 25.9–32.4)
MEAN CORPUSCULAR VOLUME: 86.1 fL (ref 77.6–95.7)
MEAN PLATELET VOLUME: 8.1 fL (ref 6.8–10.7)
MONOCYTES ABSOLUTE COUNT: 0.5 10*9/L (ref 0.3–0.8)
MONOCYTES RELATIVE PERCENT: 6.4 %
NEUTROPHILS ABSOLUTE COUNT: 5.3 10*9/L (ref 1.8–7.8)
NEUTROPHILS RELATIVE PERCENT: 70 %
NUCLEATED RED BLOOD CELLS: 0 /100{WBCs} (ref <=4)
PLATELET COUNT: 277 10*9/L (ref 150–450)
RED BLOOD CELL COUNT: 4.68 10*12/L (ref 3.95–5.13)
RED CELL DISTRIBUTION WIDTH: 13.9 % (ref 12.2–15.2)
WBC ADJUSTED: 7.6 10*9/L (ref 3.6–11.2)

## 2022-10-17 LAB — C-REACTIVE PROTEIN: C-REACTIVE PROTEIN: 10 mg/L (ref <=10.0)

## 2022-10-17 LAB — SEDIMENTATION RATE: ERYTHROCYTE SEDIMENTATION RATE: 9 mm/h (ref 0–20)

## 2022-10-17 MED ORDER — PREDNISONE 20 MG TABLET
ORAL_TABLET | Freq: Every day | ORAL | 0 refills | 5 days | Status: CP
Start: 2022-10-17 — End: 2022-10-22

## 2022-10-18 NOTE — Unmapped (Signed)
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  Considered flexor synovitis given fusiform swelling however patient hold digit in extensor position and is without other [place] signs or Ultrasound findings consistent with flexor tenosynovitis.  Considered infection, including paronychia and felon, however patient with no extensive swelling along the nailbed nor isolated under the thumb pad.  Patient without systemic signs of infection, leukocytosis and ESR reassuring at 9 mm/h as well as CRP at 10.0 mg/L.  Pt has rheumatology follow-up next week.      Independent Interpretation of Studies    I have independently interpreted the following studies:  X-ray(s): R thumb XR with no overt fracture, joint space deterioration  Ultrasound(s): ED [place] with evidence of edema, without fluid surrounding the flexor tendon    Discussion of Management with other Providers or Support Staff    I discussed the management of this patient with the:  None    Considerations Regarding Disposition/Escalation of Care and Critical Care    Indications for observation/admission (or consideration of observation/admission) and/or appropriateness for outpatient management: None  Patient/Family/Caregiver Discussions: Out patient follow-up  Diagnostic Tests Considered But Not Done: Hand [place]  Prescription Drugs Provided or Considered But Not Given: Abx  Social Determinants of Health which significantly affected care: Insurance, Ability to [employer] Medication/Care    Additional Progress Notes    20:36 Pt voices understanding of increased prednisone dosage for next five days until evaluated by Rheum Service    Portions of this record have been created using [employer]. Dictation errors have been sought, but may not have been identified and corrected.    See chart and nursing documentation for additional details.    ____________________________________________         History        Reason for Visit  Finger Swelling      HPI   Julia Bates is a 37 y.o. female with PMH of SLE, RA, HTN who presents with finger pain and swelling.  Patient reports day 2 of right fifth digit pain that has progressed with swelling.  Patient reports 10/10 sharp throbbing sensation worsening with movement or touch.  She has utilized ice, heat, prescribed 200 mg Plaquenil, oxycodone as well as Tylenol without significant improvement of swelling or pain.  Patient denies trauma, fevers, chills, nailbiting habits, hand or wrist pain.    Outside Historian(s)  None    External Records Reviewed  Outpatient notes, prior ED notes    Past Medical History:   Diagnosis Date    Abnormal Pap smear of cervix     Allergic     Sulfa    Cataract     Dysplasia of cervix, 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    ECC pos CIN 1--12/19    Essential hypertension 09/23/2022    GERD (gastroesophageal reflux disease) Oct 23    HPV (human papilloma virus) infection     Lupus (CMS-HCC)     Lupus (CMS-HCC)     Rheumatoid arthritis (CMS-HCC) 11/18/2015    SLE (  systemic lupus erythematosus related syndrome) (CMS-HCC)        Patient Active Problem List   Diagnosis    Cataract    Systemic lupus erythematosus (CMS-HCC)    Neoplasm of connective tissue    Disorder of bone and cartilage    Transaminitis    Cervical dysplasia    Rheumatoid arthritis (CMS-HCC)    Chest pain on breathing    Neutropenia (CMS-HCC)    Rash    Essential hypertension       Past Surgical History:   Procedure Laterality Date    CERVICAL BIOPSY  W/ LOOP ELECTRODE EXCISION      COLPOSCOPY      SKIN BIOPSY  2013-2014       No current facility-administered medications for this encounter.    Current Outpatient Medications:     apixaban (ELIQUIS) 5 mg Tab, Take 1 tablet (5 mg total) by mouth two (2) times a day., Disp: 180 tablet, Rfl: 3    calcium carbonate-vitamin D3 600 mg(1,500mg ) -200 unit per tablet, Take 1 tablet by mouth Two (2) times a day., Disp: , Rfl:     gabapentin (NEURONTIN) 300 MG capsule, Take 1 capsule (300 mg total) by mouth Three (3) times a day for 10 days., Disp: 30 capsule, Rfl: 0    hydroxychloroquine (PLAQUENIL) 200 mg tablet, Take 1.5 tablets (300 mg total) by mouth daily. If unable to tolerate the smell of splitting tab - can take 200mg  on MWF, 400mg  other days of the week, Disp: 45 tablet, Rfl: 11    hydrOXYzine (ATARAX) 25 MG tablet, Take 1 tablet (25 mg total) by mouth every eight (8) hours as needed., Disp: 90 tablet, Rfl: 3    losartan (COZAAR) 50 MG tablet, Take 1 tablet (50 mg total) by mouth daily., Disp: 90 tablet, Rfl: 3    melatonin 5 mg tablet, Take 1 tablet (5 mg total) by mouth nightly., Disp: , Rfl:     miscellaneous medical supply Misc, Pt needs a set of crutches., Disp: 1 each, Rfl: 0    mycophenolate (CELLCEPT) 500 mg tablet, Take 2 tablets (1,000 mg total) by mouth two (2) times a day. Stopping Myfortic, resume MMF, Disp: 360 tablet, Rfl: 3    norethindrone-ethinyl estradiol (MICROGESTIN 1/20) 1-20 mg-mcg per tablet, Take 1 tablet by mouth daily., Disp: 84 tablet, Rfl: 3    omeprazole (PRILOSEC) 20 MG capsule, Take 1 capsule (20 mg total) by mouth daily., Disp: 90 capsule, Rfl: 3    oxyCODONE (ROXICODONE) 5 MG immediate release tablet, Take 1 tablet (5 mg total) by mouth in the morning., Disp: 30 tablet, Rfl: 0    predniSONE (DELTASONE) 1 MG tablet, TAKE 1 TABLET BY MOUTH EVERY DAY (Patient taking differently: Take 5 tablets (5 mg total) by mouth daily.), Disp: 30 tablet, Rfl: 1    predniSONE (DELTASONE) 20 MG tablet, Take 1 tablet (20 mg total) by mouth daily for 5 days., Disp: 5 tablet, Rfl: 0 predniSONE (DELTASONE) 5 MG tablet, Take 1 tablet (5 mg total) by mouth daily. Taper by 10mg  every week as previously planned., Disp: 90 tablet, Rfl: 0    tizanidine (ZANAFLEX) 2 MG tablet, Take 1 tablet (2 mg total) by mouth every eight (8) hours as needed., Disp: 90 tablet, Rfl: 3    Allergies  Sulfa (sulfonamide antibiotics)    Family History   Problem Relation Age of Onset    Glaucoma Father     Diabetes Father     Hypertension Father  No Known Problems Mother     No Known Problems Sister     Diabetes Sister     Diabetes Sister     Melanoma Neg Hx     Basal cell carcinoma Neg Hx     Squamous cell carcinoma Neg Hx     Macular degeneration Neg Hx     Blindness Neg Hx        Social History  Social History     Tobacco Use    Smoking status: Never    Smokeless tobacco: Never    Tobacco comments:     None   Vaping Use    Vaping status: Never Used   Substance Use Topics    Alcohol use: Never    Drug use: Never          Physical Exam     This provider entered the patient's room: Yes:    If this provider did enter the patient room, the following was PPE worn: Surgical mask, eye protection and gloves     ED Triage Vitals [10/17/22 1634]   Enc Vitals Group      BP 138/100      Heart Rate 98      SpO2 Pulse       Resp 16      Temp 36.4 ??C (97.5 ??F)      Temp Source Oral      SpO2 99 %      Weight 60.6 kg (133 lb 9.6 oz)      Height       Head Circumference       Peak Flow       Pain Score       Pain Loc       Pain Edu?       Excl. in GC?        Constitutional: Alert and oriented. Well appearing and in no distress.  Eyes: Conjunctivae are normal.  ENT       Head: Normocephalic and atraumatic.       Nose: No congestion.       Mouth/Throat: Mucous membranes are moist.       Neck: No stridor.  Hematological/Lymphatic/Immunilogical: No cervical lymphadenopathy.  Cardiovascular: Normal rate, regular rhythm. Normal and symmetric distal pulses are present in all extremities.  Respiratory: Normal respiratory effort. Breath sounds are normal.  Gastrointestinal: Soft and nontender. There is no CVA tenderness.  Genitourinary: deferred  Musculoskeletal: Decreased active ROM of right 5th dight. Intact passive ROM with tenderness.       Right lower leg: No tenderness or edema.       Left lower leg: No tenderness or edema.  Neurologic: Normal speech and language. No gross focal neurologic deficits are appreciated.  Skin: Skin is warm, dry and intact. Malar rash.  Psychiatric: Mood and affect are normal. Speech and behavior are normal.       Radiology   XR finger 2 or more views right  Impression: No acute osseous abnormality.      Procedures     None               Ann Lions, [place]  10/17/22 2059       Ann Lions, [place]  10/20/22 1431

## 2022-10-18 NOTE — Unmapped (Signed)
Pt reports having swelling to R thumb x2 days. No known injury to thumb.

## 2022-10-20 NOTE — Unmapped (Unsigned)
Assessment/Plan:   Julia Bates is a 37 y.o. female with history of SLE who returns today for follow up.     ***    1. SLE: Characterized by rash, arthralgias, hypocomplementemia, pericarditis, remote history of nephritis. Disease not controlled. Discussed starting Rituxan at last visit but pt still considering.   - Continue Cellcept 1000mg  BID.  - Continue prednisone 5mg  daily for now.   - Discontinue anifrolumab since this didn't control disease.   - Discussed rituximab infusions again. Pt is considering this and would like more information about this. Will have our clinical pharmacist review medication with pt.   - Continue Plaquenil 200 mg once daily. Last eye exam 07/29/2021. Pt reports upcoming eye exam.   - Continue hydroxyzine 25 mg every 6-8 hours as needed for itching at the site of biopsy.   - Continue to follow up with Cardiology for prior episode of pericarditis.   - Update monitoring labs today: CBCw/diff, Crt, AST, ALT, dsDNA, C3/C4, UPC, UA, sed rate, CRP    2. Chronic lower back pain: Patient previously reported chronic back pain and was treating with oxycodone 5 mg once to twice daily. We discussed that there are increased risks while on this medication long term, and should start to consider other options for treatment. As she has seen some benefit with tizanidine at night, should continue this medication.  - Continue taking tizanidine 2 mg nightly.  - Continue oxycodone 5 mg once to twice daily at this time with plans to decrease use and taper off as soon as possible.  - UDS today  - Thoracic and lumber spine xrays today    3. Nasal congestion: started yesterday, no fever or other symptoms.   - COVID/flu/RSV PCR today    4. H/o osteopenia: Follows with Endocrinology  - Continue daily Vitamin D and Calcium.    5. Fatty liver disease - Previous abdominal US suggestive of fatty liver disease. Did not discuss today. Patient should otherwise continue to limit alcohol, increase exercise, and eat a healthy low fat diet.    -Continue to follow up with PCP.    F/U***as scheduled in August with Dr. Marland Mcalpine    I personally spent *** minutes face-to-face and non-face-to-face in the care of this patient, which includes all pre, intra, and post visit time on the date of service.  All documented time was specific to the E/M visit and does not include any procedures that may have been performed.        History of Present Illness:     Primary Care Provider: Loran Senters, FNP    Chief Complaint: Follow up SLE    HPI:  Julia Bates is a 37 y.o.  female with a h/o SLE who presents for a follow up visit. Patient has a h/o prior nephritis in the distant past; also has rash and joint pain. She was previously on  Benlysta which was stopped given it is unclear if it was helping her. In 2016, patient was in a clinical trial of an anti-CD28 inhibitor and had to be withdrawn from the study due to prolonged healing from cellulitis. She has been treated with cellcept in the past, but this was discontinued in 12/2016 due to pt desire to conceive. Started on imuran in 12/2016. Was unable to go above 50 mg imuran qd due to worsening leukopenia and no longer taking.  She continues on Plaquenil 200mg  daily (this was changed from twice daily by her ophthalmologist in the past). She  also has a hx of chronic lower back pain, for which she has been on oxycodone.     Hospitalized 05/2022 due to progressive, painful, tender rash on medial left lower thigh and lateral left upper thigh. 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 Dermatology and rheumatology were consulted and she was started on prednisone taper and Cellcept. Also started on Eliquis to address the thrombotic element of her presentation.     Last Visit: 07/25/2022 with myself.  Since this visit patient was switched to Myfortic to see if this would help her side effects but patient was unable to tolerate so was switched back to CellCept.     Interim History:   Pt presents for f/u today.     CellCept?      Prednisone?  Plaquenil?  Eye exam?    Back pain?  Oxycodone?    Joints?  Swelling?  AM stiffness?    Rash?  Oral ulcers?    Changes to urine?    Fever?  Illness or infections?    Endocrinology?    Rituxan?      Balance of 10 systems was reviewed and is negative except for that mentioned in the HPI.    Review of records: I have reviewed labs/images/clinic notes per the computerized medical record.     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  One recent report suggests that the sensitivity of IgA detection by DIF is cases of IgA vasculitis is 81% using the EULAR criteria as the gold standard for diagnosis.  The specificity of DIF for the diagnosis of IgA vasculitis is 83%.  Therefore, it is important to use clinical criteria in making the diagnosis as false negatives and false positive results can occur.  This study is consists of a predominantly adult inpatient population.     Skin biopsy 05/21/2022:  Left leg, punch  Diagnosis:  - Intravascular fibrin thrombi with sparse to moderately dense perivascular mixed inflammation with neutrophils and eosinophils and microhemorrhage. See comment.   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  A mixed pattern such as this can be seen in the context of connective tissue disease, levamisole, cryoglobulinemia, drug/medication.  Clinical correlation is recommended.    Allergies:  Sulfa (sulfonamide antibiotics)    Medications:     Current Outpatient Medications:     apixaban (ELIQUIS) 5 mg Tab, Take 1 tablet (5 mg total) by mouth two (2) times a day., Disp: 180 tablet, Rfl: 3    calcium carbonate-vitamin D3 600 mg(1,500mg ) -200 unit per tablet, Take 1 tablet by mouth Two (2) times a day., Disp: , Rfl:     gabapentin (NEURONTIN) 300 MG capsule, Take 1 capsule (300 mg total) by mouth Three (3) times a day for 10 days., Disp: 30 capsule, Rfl: 0    hydroxychloroquine (PLAQUENIL) 200 mg tablet, Take 1.5 tablets (300 mg total) by mouth daily. If unable to tolerate the smell of splitting tab - can take 200mg  on MWF, 400mg  other days of the week, Disp: 45 tablet,  Rfl: 11    hydrOXYzine (ATARAX) 25 MG tablet, Take 1 tablet (25 mg total) by mouth every eight (8) hours as needed., Disp: 90 tablet, Rfl: 3    losartan (COZAAR) 50 MG tablet, Take 1 tablet (50 mg total) by mouth daily., Disp: 90 tablet, Rfl: 3    melatonin 5 mg tablet, Take 1 tablet (5 mg total) by mouth nightly., Disp: , Rfl:     miscellaneous medical supply Misc, Pt needs a set of crutches., Disp: 1 each, Rfl: 0    mycophenolate (CELLCEPT) 500 mg tablet, Take 2 tablets (1,000 mg total) by mouth two (2) times a day. Stopping Myfortic, resume MMF, Disp: 360 tablet, Rfl: 3    norethindrone-ethinyl estradiol (MICROGESTIN 1/20) 1-20 mg-mcg per tablet, Take 1 tablet by mouth daily., Disp: 84 tablet, Rfl: 3    omeprazole (PRILOSEC) 20 MG capsule, Take 1 capsule (20 mg total) by mouth daily., Disp: 90 capsule, Rfl: 3    oxyCODONE (ROXICODONE) 5 MG immediate release tablet, Take 1 tablet (5 mg total) by mouth in the morning., Disp: 30 tablet, Rfl: 0    predniSONE (DELTASONE) 1 MG tablet, TAKE 1 TABLET BY MOUTH EVERY DAY (Patient taking differently: Take 5 tablets (5 mg total) by mouth daily.), Disp: 30 tablet, Rfl: 1    predniSONE (DELTASONE) 20 MG tablet, Take 1 tablet (20 mg total) by mouth daily for 5 days., Disp: 5 tablet, Rfl: 0    predniSONE (DELTASONE) 5 MG tablet, Take 1 tablet (5 mg total) by mouth daily. the morning., Disp: 30 tablet, Rfl: 0    predniSONE (DELTASONE) 20 MG tablet, Take 1 tablet (20 mg total) by mouth daily for 5 days., Disp: 5 tablet, Rfl: 0    predniSONE (DELTASONE) 5 MG tablet, Take 1 tablet (5 mg total) by mouth daily. Taper by 10mg  every week as previously planned., Disp: 90 tablet, Rfl: 0    tizanidine (ZANAFLEX) 2 MG tablet, Take 1 tablet (2 mg total) by mouth every eight (8) hours as needed., Disp: 90 tablet, Rfl: 3    gabapentin (NEURONTIN) 300 MG capsule, Take 1 capsule (300 mg total) by mouth Three (3) times a day for 10 days., Disp: 30 capsule, Rfl: 0    predniSONE (DELTASONE) 1 MG tablet, TAKE 1 TABLET BY MOUTH EVERY DAY (Patient taking differently: Take 5 tablets (5 mg total) by mouth daily.), Disp: 30 tablet, Rfl: 1    Medical History:  Past Medical History:   Diagnosis Date    Abnormal Pap smear of cervix     Allergic     Sulfa    Cataract     Dysplasia of cervix, 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    ECC pos CIN 1--12/19    Essential hypertension 09/23/2022    GERD (gastroesophageal reflux disease) Oct 23    HPV (human papilloma virus) infection     Lupus (CMS-HCC)     Lupus (CMS-HCC)     Rheumatoid arthritis (CMS-HCC) 11/18/2015    SLE (systemic lupus erythematosus related syndrome) (CMS-HCC)        Surgical History:  Past Surgical History:   Procedure Laterality Date    CERVICAL BIOPSY  W/ LOOP ELECTRODE EXCISION      COLPOSCOPY      SKIN BIOPSY  2013-2014       Social History:  Social History     Tobacco Use    Smoking status: Never    Smokeless tobacco: Never    Tobacco comments:  None   Vaping Use    Vaping status: Never Used   Substance Use Topics    Alcohol use: Never    Drug use: Never       Family History:  Family History   Problem Relation Age of Onset    Glaucoma Father     Diabetes Father     Hypertension Father     No Known Problems Mother     No Known Problems Sister     Diabetes Sister     Diabetes Sister     Melanoma Neg Hx     Basal cell carcinoma Neg Hx     Squamous lymphadenopathy   Cardiovascular:  Normal rate, regular rhythm.  S1 and S2 normal, without any murmur, rub, or gallop. No lower extremity edema.  Warm and well perfused.    Respiratory:  Clear to auscultation bilaterally, without wheezes, crackles, or rhonchi. Good air movement. Normal work of breathing.   Skin:    Retiform purpura on bilateral lower extremities. Livedo reticularis like lesions on anterior thighs. Photos obtained and entered into chart with patient informed verbal consent.      Psychiatry:   Alert and oriented to person, place, and time. Appropriate affect. Mood and affect appropriate and congruent.   Musculo Skeletal:   No joint tenderness, deformity, effusions. No evidence of active synovitis in the small joints of the hands.      Neurological:  Alert and oriented to person, place and time.                     Test Results  Admission on 10/17/2022, Discharged on 10/17/2022   Component Date Value    Sodium 10/17/2022 145     Potassium 10/17/2022 3.9     Chloride 10/17/2022 115 (H)     CO2 10/17/2022 21.0     Anion Gap 10/17/2022 9     BUN 10/17/2022 11     Creatinine 10/17/2022 0.94     BUN/Creatinine Ratio 10/17/2022 12     eGFR CKD-EPI (2021) Fema* 10/17/2022 81     Glucose 10/17/2022 88     Calcium 10/17/2022 9.3     Albumin 10/17/2022 3.8     Total Protein 10/17/2022 7.1     Total Bilirubin 10/17/2022 0.5     AST 10/17/2022 49 (H)     ALT 10/17/2022 45     Alkaline Phosphatase 10/17/2022 83     Sed Rate 10/17/2022 9     CRP 10/17/2022 10.0     WBC 10/17/2022 7.6     RBC 10/17/2022 4.68     HGB 10/17/2022 13.2     HCT 10/17/2022 40.3     MCV 10/17/2022 86.1     MCH 10/17/2022 28.1     MCHC 10/17/2022 32.6     RDW 10/17/2022 13.9     MPV 10/17/2022 8.1     Platelet 10/17/2022 277     nRBC 10/17/2022 0     Neutrophils % 10/17/2022 70.0     Lymphocytes % 10/17/2022 22.5     Monocytes % 10/17/2022 6.4     Eosinophils % 10/17/2022 0.1     Basophils % 10/17/2022 1.0     Absolute Neutrophils 10/17/2022 5.3     Absolute Lymphocytes 10/17/2022 1.7     Absolute Monocytes 10/17/2022 0.5     Absolute Eosinophils 10/17/2022 0.0     Absolute Basophils 10/17/2022 0.1    Admission on 09/30/2022, Discharged on 09/30/2022  Component Date Value    Pregnancy Test, Urine 09/30/2022 Negative

## 2022-10-21 ENCOUNTER — Ambulatory Visit: Admit: 2022-10-21 | Discharge: 2022-10-21 | Attending: Family | Primary: Family

## 2022-10-21 ENCOUNTER — Ambulatory Visit: Admit: 2022-10-21 | Discharge: 2022-10-21

## 2022-10-21 DIAGNOSIS — G8929 Other chronic pain: Principal | ICD-10-CM

## 2022-10-21 DIAGNOSIS — Z3041 Encounter for surveillance of contraceptive pills: Principal | ICD-10-CM

## 2022-10-21 DIAGNOSIS — M329 Systemic lupus erythematosus, unspecified: Principal | ICD-10-CM

## 2022-10-21 DIAGNOSIS — M3219 Other organ or system involvement in systemic lupus erythematosus: Principal | ICD-10-CM

## 2022-10-21 LAB — CBC W/ AUTO DIFF
BASOPHILS ABSOLUTE COUNT: 0.1 10*9/L (ref 0.0–0.1)
BASOPHILS RELATIVE PERCENT: 0.6 %
EOSINOPHILS ABSOLUTE COUNT: 0 10*9/L (ref 0.0–0.5)
EOSINOPHILS RELATIVE PERCENT: 0.2 %
HEMATOCRIT: 37.1 % (ref 34.0–44.0)
HEMOGLOBIN: 12.3 g/dL (ref 11.3–14.9)
LYMPHOCYTES ABSOLUTE COUNT: 2.6 10*9/L (ref 1.1–3.6)
LYMPHOCYTES RELATIVE PERCENT: 24.4 %
MEAN CORPUSCULAR HEMOGLOBIN CONC: 33 g/dL (ref 32.0–36.0)
MEAN CORPUSCULAR HEMOGLOBIN: 28.3 pg (ref 25.9–32.4)
MEAN CORPUSCULAR VOLUME: 85.7 fL (ref 77.6–95.7)
MEAN PLATELET VOLUME: 7.8 fL (ref 6.8–10.7)
MONOCYTES ABSOLUTE COUNT: 0.9 10*9/L — ABNORMAL HIGH (ref 0.3–0.8)
MONOCYTES RELATIVE PERCENT: 8.1 %
NEUTROPHILS ABSOLUTE COUNT: 7.2 10*9/L (ref 1.8–7.8)
NEUTROPHILS RELATIVE PERCENT: 66.7 %
PLATELET COUNT: 259 10*9/L (ref 150–450)
RED BLOOD CELL COUNT: 4.33 10*12/L (ref 3.95–5.13)
RED CELL DISTRIBUTION WIDTH: 13.6 % (ref 12.2–15.2)
WBC ADJUSTED: 10.8 10*9/L (ref 3.6–11.2)

## 2022-10-21 LAB — TOXICOLOGY SCREEN, URINE
AMPHETAMINE SCREEN URINE: NEGATIVE
BARBITURATE SCREEN URINE: NEGATIVE
BENZODIAZEPINE SCREEN, URINE: NEGATIVE
BUPRENORPHINE, URINE SCREEN: NEGATIVE
CANNABINOID SCREEN URINE: NEGATIVE
COCAINE(METAB.)SCREEN, URINE: NEGATIVE
FENTANYL SCREEN, URINE: NEGATIVE
METHADONE SCREEN, URINE: NEGATIVE
OPIATE SCREEN URINE: NEGATIVE
OXYCODONE SCREEN URINE: POSITIVE — AB

## 2022-10-21 LAB — PROTEIN / CREATININE RATIO, URINE
CREATININE, URINE: 103.3 mg/dL
PROTEIN URINE: 33.4 mg/dL
PROTEIN/CREAT RATIO, URINE: 0.323

## 2022-10-21 LAB — URINALYSIS WITH MICROSCOPY WITH CULTURE REFLEX
BILIRUBIN UA: NEGATIVE
BLOOD UA: NEGATIVE
GLUCOSE UA: NEGATIVE
KETONES UA: NEGATIVE
NITRITE UA: NEGATIVE
PH UA: 6 (ref 5.0–9.0)
PROTEIN UA: NEGATIVE
RBC UA: 1 /HPF (ref 0–3)
SPECIFIC GRAVITY UA: >=1.03 (ref 1.005–1.030)
SQUAMOUS EPITHELIAL: 18 /HPF — ABNORMAL HIGH (ref 0–5)
UROBILINOGEN UA: 0.2
WBC UA: 3 /HPF (ref 0–3)

## 2022-10-21 LAB — C3 COMPLEMENT: C3 COMPLEMENT: 123 mg/dL (ref 90–170)

## 2022-10-21 LAB — C4 COMPLEMENT: C4 COMPLEMENT: 16.4 mg/dL (ref 12.0–36.0)

## 2022-10-21 MED ORDER — DOXYCYCLINE HYCLATE 100 MG CAPSULE
ORAL_CAPSULE | Freq: Two times a day (BID) | ORAL | 0 refills | 6 days | Status: CP
Start: 2022-10-21 — End: 2022-10-27

## 2022-10-21 MED ORDER — PREDNISONE 5 MG TABLET
ORAL_TABLET | ORAL | 0 refills | 51 days | Status: CP
Start: 2022-10-21 — End: 2022-12-11
  Filled 2022-10-24: qty 60, 51d supply, fill #0

## 2022-10-21 MED ORDER — NORETHINDRONE ACETATE 1 MG-ETHINYL ESTRADIOL 20 MCG TABLET
ORAL_TABLET | Freq: Every day | ORAL | 0 refills | 84 days
Start: 2022-10-21 — End: 2023-10-21

## 2022-10-21 MED ORDER — AMOXICILLIN 875 MG TABLET
ORAL_TABLET | Freq: Two times a day (BID) | ORAL | 0 refills | 5 days | Status: CN
Start: 2022-10-21 — End: 2022-10-26

## 2022-10-21 MED ADMIN — metoPROLOL tartrate (Lopressor) tablet 150 mg: 150 mg | ORAL | @ 17:00:00 | Stop: 2022-10-21

## 2022-10-21 MED ADMIN — iohexol (OMNIPAQUE) 350 mg iodine/mL solution 90 mL: 90 mL | INTRAVENOUS | @ 18:00:00 | Stop: 2022-10-21

## 2022-10-21 MED ADMIN — nitroglycerin (NITROLINGUAL) 0.4 mg/dose spray 2 spray: 2 | SUBLINGUAL | @ 18:00:00 | Stop: 2022-10-21

## 2022-10-22 MED ORDER — NORETHINDRONE ACETATE 1 MG-ETHINYL ESTRADIOL 20 MCG TABLET
ORAL_TABLET | Freq: Every day | ORAL | 0 refills | 84.00000 days
Start: 2022-10-22 — End: 2023-10-22

## 2022-10-22 NOTE — Unmapped (Signed)
Rx was renewed on 09/25/22

## 2022-10-23 ENCOUNTER — Ambulatory Visit: Admit: 2022-10-23 | Discharge: 2022-10-24

## 2022-10-23 DIAGNOSIS — N939 Abnormal uterine and vaginal bleeding, unspecified: Principal | ICD-10-CM

## 2022-10-23 LAB — ANTI-DNA ANTIBODY, DOUBLE-STRANDED: DSDNA ANTIBODY: NEGATIVE

## 2022-10-23 MED ORDER — NORETHINDRONE (CONTRACEPTIVE) 0.35 MG TABLET
ORAL_TABLET | Freq: Every day | ORAL | 3 refills | 84.00000 days | Status: CP
Start: 2022-10-23 — End: 2022-10-23
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
## 2022-10-24 DIAGNOSIS — G8929 Other chronic pain: Principal | ICD-10-CM

## 2022-10-24 MED ORDER — OXYCODONE 5 MG TABLET
ORAL_TABLET | Freq: Every day | ORAL | 0 refills | 30 days
Start: 2022-10-24 — End: ?

## 2022-10-27 NOTE — Unmapped (Signed)
Oxycodone refill  Last ov: 10/21/2022  Next ov: 11/11/2022

## 2022-10-28 ENCOUNTER — Ambulatory Visit: Admit: 2022-10-28 | Discharge: 2022-10-29 | Attending: Family | Primary: Family

## 2022-10-28 DIAGNOSIS — I10 Essential (primary) hypertension: Principal | ICD-10-CM

## 2022-10-29 LAB — OPIATE, URINE, QUANTITATIVE
6-MONOACETYLMRPH: <5 ng/mL (ref <5)
BUPRENORPHINE: <5 ng/mL (ref <5)
CODEINE GC/MS CONF: <25 ng/mL (ref <25)
FENTANYL, URINE GC/MS: <0.5 ng/mL (ref <0.5)
HYDROCODONE GC/MS CONF: <25 ng/mL (ref <25)
HYDROMORPHONE GC/MS CONF: <25 ng/mL (ref <25)
MORPHINE GC/MS CONF: <25 ng/mL (ref <25)
NORBUPRENORPHINE: <5 ng/mL (ref <5)
NORFENTANYL, UR GC/MS: <1 ng/mL (ref <1.0)
OPIATE INTERP: POSITIVE
OXYCODONE (GC/MS): 879 ng/mL — ABNORMAL HIGH (ref <25)
OXYMORPHONE: 352 ng/mL — ABNORMAL HIGH (ref <25)

## 2022-10-31 MED ORDER — OXYCODONE 5 MG TABLET
ORAL_TABLET | Freq: Every day | ORAL | 0 refills | 30 days | Status: CP
Start: 2022-10-31 — End: ?

## 2022-11-04 ENCOUNTER — Emergency Department: Payer: No Typology Code available for payment source

## 2022-11-04 ENCOUNTER — Ambulatory Visit: Admit: 2022-11-04 | Discharge: 2022-11-05 | Payer: PRIVATE HEALTH INSURANCE | Attending: Family | Primary: Family

## 2022-11-04 ENCOUNTER — Emergency Department
Admission: EM | Admit: 2022-11-04 | Discharge: 2022-11-04 | Disposition: A | Discharge status: Home / Self Care | Payer: No Typology Code available for payment source | Attending: Emergency Medicine | Admitting: Emergency Medicine

## 2022-11-04 DIAGNOSIS — Y92410 Unspecified street and highway as the place of occurrence of the external cause: Secondary | ICD-10-CM | POA: Insufficient documentation

## 2022-11-04 DIAGNOSIS — S161XXA Strain of muscle, fascia and tendon at neck level, initial encounter: Secondary | ICD-10-CM | POA: Insufficient documentation

## 2022-11-04 DIAGNOSIS — Z7901 Long term (current) use of anticoagulants: Secondary | ICD-10-CM | POA: Insufficient documentation

## 2022-11-04 DIAGNOSIS — I10 Essential (primary) hypertension: Secondary | ICD-10-CM | POA: Insufficient documentation

## 2022-11-04 DIAGNOSIS — S199XXA Unspecified injury of neck, initial encounter: Secondary | ICD-10-CM | POA: Diagnosis present

## 2022-11-04 DIAGNOSIS — R519 Headache, unspecified: Secondary | ICD-10-CM | POA: Insufficient documentation

## 2022-11-04 DIAGNOSIS — M7918 Myalgia, other site: Secondary | ICD-10-CM

## 2022-11-04 MED ORDER — IBUPROFEN 600 MG PO TABS: 600.0000 mg | ORAL_TABLET | Freq: Four times a day (QID) | ORAL | 0 refills | Status: AC | PRN

## 2022-11-04 MED ORDER — METHOCARBAMOL 500 MG PO TABS: 500.0000 mg | ORAL_TABLET | Freq: Four times a day (QID) | ORAL | 0 refills | Status: AC

## 2022-11-04 NOTE — Unmapped (Signed)
SPORTS MEDICINE NEW VISIT    ASSESSMENT AND PLAN      Diagnosis ICD-10-CM Associated Orders   1. Chronic bilateral low back pain without sciatica  M54.50 MRI Lumbar Spine Wo Contrast    G89.29       2. Swelling of right thumb  M79.89 Ambulatory referral to Orthopedic Surgery           Her pain is more consistent with MRI of the lumbar spine for further evaluation given her complex medical history and increased sensitivity of MRI versus prior lumbar spine CT scan.    Regarding right thumb, she does not have any focal fluid collection or abscess but may have a felon.  I recommended returning to clinic if pain and swelling do not continue to improve or if they return after completing treatment as dictated by rheumatology.    Return for TBD.    Procedure(s):  None      SUBJECTIVE     Chief Complaint: No chief complaint on file.      History of Present Illness: 37 y.o. female with complex medical history including SLE, osteoporosis and rheumatoid arthritis who presents for chronic low back pain.  Her rheumatology NP ordered an MRI of the pelvis (looking for sacroiliitis) and was referred to orthopedics for strain of abductor magnus muscle right lower extremity.  Ms. Luc reports a several year history of low back pain without radiation of pain down the legs.  No specific injury.  She ambulates with a cane at baseline.    She has also had atraumatic right thumb pain and swelling treated with doxycycline and prednisone by rheumatology; noted concern for MRSA given history of prior infection.  She reports still taking both medications and having some improvement although it is not back to baseline and is still tender.    Past Medical History:   Past Medical History:   Diagnosis Date    Abnormal Pap smear of cervix     Allergic     Sulfa    Cataract     Dysplasia of cervix, 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    ECC pos CIN 1--12/19    Essential hypertension 09/23/2022    GERD (gastroesophageal reflux disease) Oct 23    HPV (human papilloma virus) infection     Lupus (CMS-HCC)     Lupus (CMS-HCC)     Rheumatoid arthritis (CMS-HCC) 11/18/2015    SLE (systemic lupus erythematosus related syndrome) (CMS-HCC)          OBJECTIVE     Physical Exam:  Vitals:   Wt Readings from Last 3 Encounters:   11/11/22 61.2 kg (135 lb)   10/28/22 61.7 kg (136 lb)   10/21/22 61.6 kg (135 lb 12.8 oz)     Estimated body mass index is 27.27 kg/m?? as calculated from the following:    Height as of 11/11/22: 149.9 cm (4' 11).    Weight as of 11/11/22: 61.2 kg (135 lb).  Gen: Well-appearing female in no acute distress  MSK: Gait is slow and antalgic ambulating with a cane.  Tenderness to palpation diffusely about lumbrosacral spine.  Negative seated straight leg raises bilaterally.  Right thumb with mild diffuse edema and tenderness to palpation.  No palpable fluctuance.  No excessive warmth with mild erythema.  She is able to flex and extend the thumb with some pain.  Capillary refill is brisk.    Imaging/other tests: No new imaging today.    @SPORTSPROMIS @  ADMINISTRATIVE     I have personally reviewed and interpreted the images (as available).  Point-of-care ultrasound imaging is on file and stored in a permanent location (if performed).  I have personally reviewed prior records and incorporated relevant information above (as available).    @SMIBILLING @    PROCEDURES     Procedures     DME     DME ORDER:  Dx:  ,
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
## 2022-11-04 NOTE — Unmapped (Addendum)
Plan:MRI Scheduling and follow up instructions:    Your [facility] Provider has ordered an MRI test for you. The Radiology Department should call you directly in 2-3 business days to get this test scheduled. If you do not hear from the Radiology Department by this time, please feel free to call them at [phone], option 1.  Please send me a message on MyChart after MRI is complete and we will determine the next step for treatment.    Thank you ,    [facility]        Thank you for coming to [facility] and our clinic today!     We aim to provide you with the highest quality, individualized care.  If you have any unanswered questions after the visit, please do not hesitate to reach out to [place] on MyChart or leave a message for the nurse.  ?  MyChart messages: These messages can be sent to your provider and will be checked by their clinical support staff.? The messages are checked throughout the day during normal business hours from 8:30 am-4:00 pm Monday-Friday, however responses may take up to 48 hours.? Please use this method of communication for non-urgent and non-emergent concerns, questions, refill requests or inquiries only.? ?Our team will help respond to all of your questions.? Please note that you may be asked to see a provider by either a telehealth or in person visit if it is deemed your questions are best handled in the clinic setting in person.??  ?  Please keep in mind, these messages are not real time communications, so be patient when waiting for a response.    If you do not have access to MyChart, do not know how to use MyChart or have an issue that may require more extensive discussion, please call the nurses' call line: [phone].? This line is checked throughout the day and will be responded to as time allows.? Please note that return calls could take up to 48 hours, depending on the nature of the need.?  ?  If you have an issue that requires emergent attention that cannot wait; either call the Orthopaedics resident on call at [phone], consider coming to our [facility] walk-in clinic, or go to the nearest Emergency Department.    If you need to schedule future appointments, please call [phone].     We look forward to seeing you again in the future and appreciate you choosing [facility] for your care!    Thank you,                We provide innovative and comprehensive patient centered care that is supported by evidence-based research                                                                                                    RESEARCH PARTICIPATION    Please check out our current research studies to see if you or someone you know may qualify at:    [email or web address]

## 2022-11-06 NOTE — Unmapped (Signed)
Assessment/Plan:   Julia Bates is a 37 y.o. female with history of SLE who returns today for follow up.     1. SLE: Characterized by rash, arthralgias, hypocomplementemia, pericarditis, remote history of nephritis. Discussed starting Rituxan again but unable to due to travel currently. She states she is interested in starting Rituxan once she has dependable transportation.   - Increase Cellcept to 1000mg  in AM and 1500mg  in PM.  - Taper prednisone to 4mg x30dy, then 3mg  until next visit.   - Discontinue anifrolumab since this didn't control disease.   - Discussed rituximab infusions again. Pt doesn't currently have dependable transportation.   - Continue Plaquenil 200 mg once daily. Last eye exam 07/29/2021. Eye exam scheduled 01/16/23.  - Continue to follow up with Cardiology for prior episode of pericarditis.   - Recent monitoring labs from 3/12.24 reviewed and stable. Negative dsDNA, C3/C4.   - Will update labs in 2 weeks to monitor cellcept increase. Pt agrees to complete at [facility]. If stable then will increase to 1500mg  BID.     2. Chronic lower back pain: Patient previously reported chronic back pain and was treating with oxycodone 5 mg once to twice daily. Discussed need to taper oxycodone.   - Continue tizanidine 2 mg nightly.  - Decrease oxycodone to 2.5 mg once daily.  Stressed the need to ultimately stop.   - UDS completed 10/21/22  - Continue F/U with Ortho, last visit 11/04/22.     3. Erythema and swelling to R distal thumb: improving    4. H/o osteopenia: Follows with Endocrinology  - Continue daily Vitamin D and Calcium.    5. Fatty liver disease - Previous abdominal US suggestive of fatty liver disease. Did not discuss today. Patient should otherwise continue to limit alcohol, increase exercise, and eat a healthy low fat diet.    -Continue to follow up with PCP.    F/U in 3 months with myself and as scheduled in October with Dr. Marland Mcalpine    I personally spent 35 minutes face-to-face and non-face-to-face in the care of this patient, which includes all pre, intra, and post visit time on the date of service.  All documented time was specific to the E/M visit and does not include any procedures that may have been performed.    History of Present Illness:     Primary Care Provider: Loran Senters, FNP    Chief Complaint: Follow up SLE    HPI:  Julia Bates is a 37 y.o.  female with a h/o SLE who presents for a follow up visit. Patient has a h/o prior nephritis in the distant past; also has rash and joint pain. She was previously on  Benlysta which was stopped given it is unclear if it was helping her. In 2016, patient was in a clinical trial of an anti-CD28 inhibitor and had to be withdrawn from the study due to prolonged healing from cellulitis. She has been treated with cellcept in the past, but this was discontinued in 12/2016 due to pt desire to conceive. Started on imuran in 12/2016. Was unable to go above 50 mg imuran qd due to worsening leukopenia and no longer taking.  She continues on Plaquenil 200mg  daily (this was changed from twice daily by her ophthalmologist in the past). She also has a hx of chronic lower back pain, for which she has been on oxycodone.     Hospitalized 05/2022 due to progressive, painful, tender rash on medial left lower thigh and lateral  left upper thigh. 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 Dermatology and rheumatology were consulted and she was started on prednisone taper and Cellcept. Also started on Eliquis to address the thrombotic element of her presentation.     Saphnelo discontinued 05/2022 due to continued disease activity.     09/2022 pt switched back to cellcept from Myfortic due to GERD.     Last Visit: 10/21/22 with myself.      Interim History:   Pt presents for f/u today. She continues to have joint pain. She reports joint pain is worse in the morning. Most of her pain to hands and knees. She reports AM stiffness lasting one hour. She reports some swelling to ankle but typically after standing or walking. No other joint swelling. The swelling to her thumb resolved but still has some pain. She is taking cellcept two tabs BID. She feels she is tolerating this better since starting omeprazole. She is taking HCQ. She is on prednisone 5mg  daily. No recent rash. No recent Julia ulcers. No recent bloody or frothy urine. No recent fever, illness or infections.     Balance of 10 systems was reviewed and is negative except for that mentioned in the HPI.    Review of records: I have reviewed labs/images/clinic notes per the computerized medical record.     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  One recent report suggests that the sensitivity of IgA detection by DIF is cases of IgA vasculitis is 81% using the EULAR criteria as the gold standard for diagnosis.  The specificity of DIF for the diagnosis of IgA vasculitis is 83%.  Therefore, it is important to use clinical criteria in making the diagnosis as false negatives and false positive results can occur.  This study is consists of a predominantly adult inpatient population.     Skin biopsy 05/21/2022:  Left leg, punch  Diagnosis:  - Intravascular fibrin thrombi with sparse to moderately dense perivascular mixed inflammation with neutrophils and eosinophils and microhemorrhage. See comment.   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  A mixed pattern such as this can be seen in the context of connective tissue disease, levamisole, cryoglobulinemia, drug/medication.  Clinical correlation is recommended.    Allergies:  Sulfa (sulfonamide antibiotics)    Medications:     Current Outpatient Medications:     apixaban (ELIQUIS) 5 mg Tab, Take 1 tablet (5 mg total) by mouth two (2) times a day., Disp: 180 tablet, Rfl: 3    calcium carbonate-vitamin D3 600 mg(1,500mg ) -200 unit per tablet, Take 1 tablet by mouth Two (2) times a day., Disp: , Rfl:     hydroxychloroquine (PLAQUENIL) 200 mg tablet, Take 1.5 tablets (300 mg total) by mouth daily. If unable to tolerate the smell of splitting tab - can take 200mg  on MWF, 400mg  other days of the week, Disp: 45 tablet, Rfl: 11    hydrOXYzine (ATARAX) 25 MG tablet, Take 1 tablet (25 mg total) by mouth every eight (8) hours as needed., Disp:  90 tablet, Rfl: 3    losartan (COZAAR) 50 MG tablet, Take 1 tablet (50 mg total) by mouth daily., Disp: 90 tablet, Rfl: 3    melatonin 5 mg tablet, Take 1 tablet (5 mg total) by mouth nightly., Disp: , Rfl:     miscellaneous medical supply Misc, Pt needs a set of crutches. (Patient not taking: Reported on 10/28/2022), Disp: 1 each, Rfl: 0    mycophenolate (CELLCEPT) 500 mg tablet, Take 2 tablets (1,000 mg total) by mouth two (2) times a day. Stopping Myfortic, resume MMF, Disp: 360 tablet, Rfl: 3    norethindrone (MICRONOR) 0.35 mg tablet, Take 1 tablet by mouth daily., Disp: 84 tablet, Rfl: 3    omeprazole (PRILOSEC) 20 MG capsule, Take 1 capsule (20 mg total) by mouth daily., Disp: 90 capsule, Rfl: 3    oxyCODONE (ROXICODONE) 5 MG immediate release tablet, Take 1 tablet (5 mg total) by mouth in the morning., Disp: 30 tablet, Rfl: 0    predniSONE (DELTASONE) 5 MG tablet, Take 3 tablets (15 mg total) by mouth daily for 3 days, THEN 2 tablets (10 mg total) daily for 3 days, THEN 1 tablet (5 mg total) daily. Taper by 10mg  every week as previously planned.., Disp: 60 tablet, Rfl: 0    tizanidine (ZANAFLEX) 2 MG tablet, Take 1 tablet (2 mg total) by mouth every eight (8) hours as needed., Disp: 90 tablet, Rfl: 3    Medical History:  Past Medical History:   Diagnosis Date    Abnormal Pap smear of cervix     Allergic     Sulfa    Cataract     Dysplasia of cervix, 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    ECC pos CIN 1--12/19    Essential hypertension 09/23/2022    GERD (gastroesophageal reflux disease) Oct 23    HPV (human papilloma virus) infection     Lupus (CMS-HCC)     Lupus (CMS-HCC)     Rheumatoid arthritis (CMS-HCC) 11/18/2015    SLE (systemic lupus erythematosus related syndrome) (CMS-HCC)        Surgical History:  Past Surgical History:   Procedure Laterality Date    CERVICAL BIOPSY  W/ LOOP ELECTRODE EXCISION      COLPOSCOPY      SKIN BIOPSY  2013-2014       Social History:  Social History     Tobacco Use    Smoking status: Never    Smokeless tobacco: Never    Tobacco comments:     None   Vaping Use    Vaping status: Never Used   Substance Use Topics    Alcohol use: Never    Drug use: Never       Family History:  Family History   Problem Relation Age of Onset    Glaucoma Father     Diabetes Father     Hypertension Father     No Known Problems Mother     No Known Problems Sister     Diabetes Sister     Diabetes Sister     Melanoma Neg Hx     Basal cell carcinoma Neg Hx     Squamous cell carcinoma Neg Hx     Macular degeneration Neg Hx     Blindness Neg Hx        Objective   Vitals:    11/11/22 1013   BP: 134/85   Pulse: 81   Temp: 36.6 ??C (97.9 ??F)   Weight: 61.2 kg (  135 lb)   Height: 149.9 cm (4' 11)     Physical Exam:  Constitutional:   Patient is well appearing, and does not appear to be in any acute distress   Eyes:   PERRLA, EOMI, and sclera aniteric. No scleral injection, no discharge, no allergic shiners.   ENT:   Moist, mucous membranes. Oropharhynx without any erythema or exudate. No sinus tenderness, nasal ulcerations, or oropharyngeal exudates or ulcerations.      Lymphatic  No cervical lymphadenopathy   Cardiovascular:  Normal rate, regular rhythm.  S1 and S2 normal, without any murmur, rub, or gallop. No lower extremity edema.  Warm and well perfused.    Respiratory:  Clear to auscultation bilaterally, without wheezes, crackles, or rhonchi. Good air movement. Normal work of breathing.   Skin:    No rash to visible skin     Psychiatry:   Alert and oriented to person, place, and time. Appropriate affect. Mood and affect appropriate and congruent.   Musculo Skeletal:   No joint tenderness, deformity, effusions. No evidence of active synovitis in the small joints of the hands.      Neurological:  Alert and oriented to person, place and time.             Test Results  Hospital Outpatient Visit on 10/21/2022   Component Date Value    Creatinine Whole Blood, * 10/21/2022 0.9     eGFR CKD-EPI (2021) Fema* 10/21/2022 85    Office Visit on 10/21/2022   Component Date Value    dsDNA Ab 10/21/2022 Negative     C3 Complement 10/21/2022 123     C4 Complement 10/21/2022 16.4     Color, UA 10/21/2022 Yellow     Clarity, UA 10/21/2022 Clear     Specific Gravity, UA 10/21/2022 >=1.030     pH, UA 10/21/2022 6.0     Leukocyte Esterase, UA 10/21/2022 Trace (A)     Nitrite, UA 10/21/2022 Negative     Protein, UA 10/21/2022 Negative     Glucose, UA 10/21/2022 Negative     Ketones, UA 10/21/2022 Negative     Urobilinogen, UA 10/21/2022 0.2 mg/dL     Bilirubin, UA 16/05/9603 Negative     Blood, UA 10/21/2022 Negative     RBC, UA 10/21/2022 1     WBC, UA 10/21/2022 3     Squam Epithel, UA 10/21/2022 18 (H)     Bacteria, UA 10/21/2022 Rare (A)     Hyphal Yeast 10/21/2022 Few (A)     Creat U 10/21/2022 103.3     Protein, Ur 10/21/2022 33.4     Protein/Creatinine Ratio* 10/21/2022 0.323     Amphetamines Screen, Ur 10/21/2022 Negative     Barbiturates Screen, Ur 10/21/2022 Negative     Benzodiazepines Screen, * 10/21/2022 Negative     Cannabinoids Screen, Ur 10/21/2022 Negative     Methadone Screen, Urine 10/21/2022 Negative     Cocaine(Metab.)Screen, U* 10/21/2022 Negative     Opiates Screen, Ur 10/21/2022 Negative Fentanyl Screen, Ur 10/21/2022 Negative     Oxycodone Screen, Ur 10/21/2022 Positive (A)     Buprenorphine, Urine 10/21/2022 Negative     6-Monoacetylmorphine 10/21/2022 <5     Morphine 10/21/2022 <25     Codeine 10/21/2022 <25     Hydrocodone 10/21/2022 <25     Hydromorphone 10/21/2022 <25     Oxycodone 10/21/2022 879 (H)     Oxymorphone 10/21/2022 352 (H)     Buprenorphine 10/21/2022 <5  Norbuprenorphine 10/21/2022 <5     Fentanyl 10/21/2022 <0.5     Norfentanyl 10/21/2022 <1.0     Interpretation 10/21/2022 POSITIVE     WBC 10/21/2022 10.8     RBC 10/21/2022 4.33     HGB 10/21/2022 12.3     HCT 10/21/2022 37.1     MCV 10/21/2022 85.7     MCH 10/21/2022 28.3     MCHC 10/21/2022 33.0     RDW 10/21/2022 13.6     MPV 10/21/2022 7.8     Platelet 10/21/2022 259     Neutrophils % 10/21/2022 66.7     Lymphocytes % 10/21/2022 24.4     Monocytes % 10/21/2022 8.1     Eosinophils % 10/21/2022 0.2     Basophils % 10/21/2022 0.6     Absolute Neutrophils 10/21/2022 7.2     Absolute Lymphocytes 10/21/2022 2.6     Absolute Monocytes 10/21/2022 0.9 (H)     Absolute Eosinophils 10/21/2022 0.0     Absolute Basophils 10/21/2022 0.1     Urine Culture, Comprehen* 10/21/2022 Mixed Urogenital Flora    Admission on 10/17/2022, Discharged on 10/17/2022   Component Date Value    Sodium 10/17/2022 145     Potassium 10/17/2022 3.9     Chloride 10/17/2022 115 (H)     CO2 10/17/2022 21.0     Anion Gap 10/17/2022 9     BUN 10/17/2022 11     Creatinine 10/17/2022 0.94     BUN/Creatinine Ratio 10/17/2022 12     eGFR CKD-EPI (2021) Fema* 10/17/2022 81     Glucose 10/17/2022 88     Calcium 10/17/2022 9.3     Albumin 10/17/2022 3.8     Total Protein 10/17/2022 7.1     Total Bilirubin 10/17/2022 0.5     AST 10/17/2022 49 (H)     ALT 10/17/2022 45     Alkaline Phosphatase 10/17/2022 83     Sed Rate 10/17/2022 9     CRP 10/17/2022 10.0     WBC 10/17/2022 7.6     RBC 10/17/2022 4.68     HGB 10/17/2022 13.2     HCT 10/17/2022 40.3     MCV 10/17/2022 86.1     MCH 10/17/2022 28.1     MCHC 10/17/2022 32.6     RDW 10/17/2022 13.9     MPV 10/17/2022 8.1     Platelet 10/17/2022 277     nRBC 10/17/2022 0     Neutrophils % 10/17/2022 70.0     Lymphocytes % 10/17/2022 22.5     Monocytes % 10/17/2022 6.4     Eosinophils % 10/17/2022 0.1     Basophils % 10/17/2022 1.0     Absolute Neutrophils 10/17/2022 5.3     Absolute Lymphocytes 10/17/2022 1.7     Absolute Monocytes 10/17/2022 0.5     Absolute Eosinophils 10/17/2022 0.0     Absolute Basophils 10/17/2022 0.1

## 2022-11-10 NOTE — Unmapped (Signed)
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      1.  Thrombotic vasculopathy:  History of lupus, previously on Plaquenil.  No prior h/o venous or arterial thrombosis.  APLA panels negative on multiple occasions dating back to 2012.  Admitted in 05/2022 with new onset painful, purpuric LLE rash.   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  DIC panel negative.  Repeat APLA panel negative.  Protein C/S and antithrombin testing negative.  Started on pulsed steroids with addition of CellCept by Rheumatology.  Also started on apixaban for thrombotic component.    LLE rash has fully resolved.  As such, I think that patient can stop apixaban now.  Encouraged patient to monitor previous rash site and seek medical evaluation if it recurs or if new similar rash develops elsewhere.  We also discussed that POPs increase thrombotic risk to a small degree, though not to the same extent as estrogen.  As such, I do not think there is a clear contraindication to stop them, though there are other safer options such as progesterone IUDs or non-hormone contraception.  Patient voiced understanding.  No follow up scheduled.      Plans:  1.  Can stop apixaban now.  2.  Ongoing SLE management per [facility] Rheumatology.  3.  No strict contraindication to POPs but discussed alternative contraception that is safer from thrombosis standpoint.  4.  No follow up scheduled.      Laverta Baltimore, MD  -----------------------------------    History of Present Illness:   Julia Bates is a 37 y.o. 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  Seen initially by inpatient Co[facility] Consult team on 05/21/22.  In summary, patient has a h/o SLE, on Plaquenil, and presented to [facility] ED due to new, rapid onset painful LLE painful rash.  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  Patient with multiple previous negative APLA panels dating back to 2012.  Repeat panel during admit negative as well.  Protein C/S, antithrombin normal.  Cryoglobulins negative.  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  Started on steroids with planned taper as well as apixaban with loading regimen.  Also started on CellCept per Rheumatology.  Discharged on 05/24/22.  Of note, patient also had significant neutropenia on admit at 0.1 but normalized on subsequent checks.    Has tolerated apixaban well with no bleeding issues reported.  No menses since 02/2022 but never had heavy menses in the past.  No cost issues with apixaban.  Last seen 06/2022.    In the interim, patient reports doing well.  Remains on apixaban 5 mg BID.  Continues to tolerate well without bleeding.  LLE rash has fully resolved.  Still has some occasional soreness isolated to biopsy sites.  Was recently started on POPs about a month ago.  Was previously on combo OCPs, which were stopped at  the time of her vasculopathy diagnosis.      Visit Diagnoses:  1. Vasculopathy    2. Systemic lupus erythematosus, unspecified SLE type, unspecified organ involvement status (CMS-HCC)    3. 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    ECC pos CIN 1--12/19    Essential hypertension 09/23/2022    GERD (gastroesophageal reflux disease) Oct 23    HPV (human papilloma virus) infection     Lupus (CMS-HCC)     Lupus (CMS-HCC)     Rheumatoid arthritis (CMS-HCC) 11/18/2015    SLE (systemic lupus erythematosus related syndrome) (CMS-HCC)        Surgical History:  Past Surgical History:   Procedure Laterality Date    CERVICAL BIOPSY  W/ LOOP ELECTRODE EXCISION      COLPOSCOPY      SKIN BIOPSY  2013-2014       Medications:   Current Outpatient Medications   Medication Sig Dispense Refill    apixaban (ELIQUIS) 5 mg Tab Take 1 tablet (5 mg total) by mouth two (2) times a day. 180 tablet 3    calcium carbonate-vitamin D3 600 mg(1,500mg ) -200 unit per tablet Take 1 tablet by mouth Two (2) times a day.      hydroxychloroquine (PLAQUENIL) 200 mg tablet Take 1.5 tablets (300 mg total) by mouth daily. If unable to tolerate the smell of splitting tab - can take 200mg  on MWF, 400mg  other days of the week 45 tablet 11    hydrOXYzine (ATARAX) 25 MG tablet Take 1 tablet (25 mg total) by mouth every eight (8) hours as needed. 90 tablet 3    losartan (COZAAR) 50 MG tablet Take 1 tablet (50 mg total) by mouth daily. 90 tablet 3    melatonin 5 mg tablet Take 1 tablet (5 mg total) by mouth nightly.      miscellaneous medical supply Misc Pt needs a set of crutches. (Patient not taking: Reported on 10/28/2022) 1 each 0    mycophenolate (CELLCEPT) 500 mg tablet Take 2 tablets (1,000 mg total) by mouth two (2) times a day. Stopping Myfortic, resume MMF 360 tablet 3    norethindrone (MICRONOR) 0.35 mg tablet Take 1 tablet by mouth daily. 84 tablet 3    omeprazole (PRILOSEC) 20 MG capsule Take 1 capsule (20 mg total) by mouth daily. 90 capsule 3    oxyCODONE (ROXICODONE) 5 MG immediate release tablet Take 1 tablet (5 mg total) by mouth in the morning. 30 tablet 0    predniSONE (DELTASONE) 5 MG tablet Take 3 tablets (15 mg total) by mouth daily for 3 days, THEN 2 tablets (10 mg total) daily for 3 days, THEN 1 tablet (5 mg total) daily. Taper by 10mg  every week as previously planned.. 60 tablet 0    tizanidine (ZANAFLEX) 2 MG tablet Take 1 tablet (2 mg total) by mouth every eight (8) hours as needed. 90 tablet 3     No current facility-administered medications for this visit.       Allergies:  Sulfa (sulfonamide antibiotics)    Social History:  Social History     Socioeconomic History    Marital status: Single   Tobacco Use    Smoking status: Never  Smokeless tobacco: Never    Tobacco comments:     None   Vaping Use    Vaping status: Never Used   Substance and Sexual Activity    Alcohol use: Never    Drug use: Never    Sexual activity: Yes     Partners: Male     Birth control/protection: Other     Comment: Pills   Other Topics Concern    Do you use sunscreen? Yes    Tanning bed use? No    Are you easily burned? No    Excessive sun exposure? No    Blistering sunburns? No     Social Determinants of Health     Financial Resource Strain: Low Risk  (07/11/2022)    Overall Financial Resource Strain (CARDIA)     Difficulty of Paying Living Expenses: Not hard at all   Food Insecurity: No Food Insecurity (07/11/2022)    Hunger Vital Sign     Worried About Running Out of Food in the Last Year: Never true     Ran Out of Food in the Last Year: Never true   Transportation Needs: No Transportation Needs (07/11/2022)    PRAPARE - Transportation     Lack of Transportation (Medical): No     Lack of Transportation (Non-Medical): No   Stress: No Stress Concern Present (06/04/2022)    [employer] of Occupational Health - Occupational Stress Questionnaire     Feeling of Stress : Not at all     Lives in [place], [place].  Non smoker.  Denies drug use.      Family History:  family history includes Diabetes in her father, sister, and sister; Glaucoma in her father; Hypertension in her father; No Known Problems in her mother and sister.   She indicated that her mother is alive. She indicated that her father is alive. She indicated that two of her three sisters are alive. She indicated that the status of her neg hx is unknown.    No known FH of venous or arterial thrombosis.    Review of Systems:  As per HPI, otherwise negative x 12 systems.    Physical Exam:  BP 137/106  - Pulse 89  - Temp 36.7 ??C (98.1 ??F)  - Ht 149.9 cm (4' 11)  - Wt 62.1 kg (137 lb)  - SpO2 98%  - BMI 27.67 kg/m??     General Appearance:  Awake, alert, cooperative, well appearing, no distress   Head:  Normocephalic, without obvious abnormality, atraumatic   Eyes:  PERRL, no icterus or conjunctivitis   Throat: Mucous membranes moist, no ulcers or lesions, no palatal petechiae or wet purpura   Lungs:   Normal work of breathing, no audible wheezes or stridor, respirations unlabored   Skin: No petechiae or purpura, resolved LLE medial thigh purpuric rash   Extremities: No cyanosis or edema   Neurologic: Non-focal, gait normal, cranial nerves grossly intact   Psych: Normal affect, linear thought, no tangential speech         Test Results  Results for orders placed or performed during the hospital encounter of 10/21/22   POCT Creatinine   Result Value Ref Range    Creatinine Whole Blood, POC 0.9 0.7 - 1.1 mg/dL    eGFR CKD-EPI (1610) Female 85 >=60 mL/min/1.22m2     *Note: Due to a large number of results and/or encounters for the requested time period, some results have not been displayed. A complete set of results can be  found in Results Review.        Latest Reference Range & Units 10/21/22 12:49   WBC 3.6 - 11.2 10*9/L 10.8   RBC 3.95 - 5.13 10*12/L 4.33   HGB 11.3 - 14.9 g/dL 95.2   HCT 84.1 - 32.4 % 37.1   MCV 77.6 - 95.7 fL 85.7   MCH 25.9 - 32.4 pg 28.3   MCHC 32.0 - 36.0 g/dL 40.1   RDW 02.7 - 25.3 % 13.6   MPV 6.8 - 10.7 fL 7.8   Platelet 150 - 450 10*9/L 259   Neutrophils % % 66.7   Lymphocytes % % 24.4   Monocytes % % 8.1   Eosinophils % % 0.2   Basophils % % 0.6   Absolute Neutrophils 1.8 - 7.8 10*9/L 7.2   Absolute Lymphocytes 1.1 - 3.6 10*9/L 2.6   Absolute Monocytes  0.3 - 0.8 10*9/L 0.9 (H)   Absolute Eosinophils 0.0 - 0.5 10*9/L 0.0   Absolute Basophils  0.0 - 0.1 10*9/L 0.1   C3 Complement 90 - 170 mg/dL 664   C4 Complement 40.3 - 36.0 mg/dL 47.4   (H): Data is abnormally high     Latest Reference Range & Units 05/21/22 01:55 05/21/22 08:53   Protein C Activity 68 - 170 %Normal  86   Protein S Activity 64 - 147 %Normal  70   Dilute Viper Venom Time 0.0 - 46.4 s 40.0    APTT Lupus Anticoagulant 0.0 - 42.8 s 35.7    Anticardiolipin IgG 0.0 - 23.0 [GPL'U] 7.0    Anticardiolipin IgM 0.0 - 11.0 [MPL'U] 3.0    Anti-PL Interp  See Comment    Beta-2 Glyco 1 IgG <20.0  2.0    Beta-2 Glyco 1 IgM <20.0  1.0

## 2022-11-11 ENCOUNTER — Ambulatory Visit: Admit: 2022-11-11 | Discharge: 2022-11-12 | Payer: PRIVATE HEALTH INSURANCE | Attending: Family | Primary: Family

## 2022-11-11 ENCOUNTER — Ambulatory Visit
Admit: 2022-11-11 | Discharge: 2022-11-12 | Payer: PRIVATE HEALTH INSURANCE | Attending: Hematology | Primary: Hematology

## 2022-11-11 DIAGNOSIS — M3219 Other organ or system involvement in systemic lupus erythematosus: Principal | ICD-10-CM

## 2022-11-11 DIAGNOSIS — D709 Neutropenia, unspecified: Principal | ICD-10-CM

## 2022-11-11 DIAGNOSIS — M329 Systemic lupus erythematosus, unspecified: Principal | ICD-10-CM

## 2022-11-11 DIAGNOSIS — Z79899 Other long term (current) drug therapy: Principal | ICD-10-CM

## 2022-11-11 DIAGNOSIS — I999 Unspecified disorder of circulatory system: Principal | ICD-10-CM

## 2022-11-11 MED ORDER — PREDNISONE 1 MG TABLET
ORAL_TABLET | ORAL | 0 refills | 7 days | Status: CP
Start: 2022-11-11 — End: 2022-11-11
  Filled 2022-11-12: qty 300, 90d supply, fill #0

## 2022-11-11 MED ORDER — MYCOPHENOLATE MOFETIL 500 MG TABLET
ORAL_TABLET | Freq: Two times a day (BID) | ORAL | 0 refills | 90.00000 days | Status: CP
Start: 2022-11-11 — End: 2023-11-11
  Filled 2022-11-18: qty 540, 90d supply, fill #0

## 2022-11-11 NOTE — Unmapped (Addendum)
Your provider today was Brennan Bailey, NP    Thank you for letting us be involved with your care!    Today we discussed the following:    Decrease prednisone to 4mg  daily for one month then if able decrease to 3mg  daily until next visit.     Increase Cellcept to two tabs in AM and three tabs in PM. Then recheck labs in two weeks.     Decrease oxycodone to 1/2 tablet daily.     Labs today.    It can take 10-14 days for all of the test results to come back. I will send you a MyChart message when they are complete.   If you have non-urgent questions, the best way to contact us is to send a MyChart message by visiting [email or web address].  You can also use MyChart to request refills and access test results.  I will do my best to respond within 2 business days, however occasionally it may take longer. If you have immediate concerns, please contact our clinic by phone [phone].

## 2022-11-14 NOTE — Unmapped (Signed)
Clinical Assessment Needed For: Dose Change  Medication: Mycophenolate 500mg  tablet  Last Fill Date/Day Supply: 09/29/2022 / 90 days  Copay $0  Was previous dose already scheduled to fill: No    Notes to Pharmacist: N/A

## 2022-11-17 NOTE — Unmapped (Signed)
[facility] Refill Coordination Note    Specialty Medication(s) to be Shipped:   Inflammatory Disorders: mycophenolate    Other medication(s) to be shipped:  hydroxychloroquine     Julia Bates, DOB: 08-17-1985  Phone: [phone] (home)       All above HIPAA information was verified with patient.     Was a Nurse, learning disability used for this call? No    Completed refill call assessment today to schedule patient's medication shipment from the [facility] [phone]).  All relevant notes have been reviewed.     Specialty medication(s) and dose(s) confirmed: Patient reports changes to the regimen as follows: increase mycophenolate to 2 AM and 3 in the PM and then if tolerated, increase to 3 tabs BID     Changes to medications: Wilna reports no changes at this time.  Changes to insurance: No  New side effects reported not previously addressed with a pharmacist or physician: None reported  Questions for the pharmacist: No    Confirmed patient received a Conservation officer, historic buildings and a Surveyor, mining with first shipment. The patient will receive a drug information handout for each medication shipped and additional FDA Medication Guides as required.       DISEASE/MEDICATION-SPECIFIC INFORMATION        N/A    SPECIALTY MEDICATION ADHERENCE              Were doses missed due to medication being on hold? No    mycophenolate 500 mg: 7-14 days of medicine on hand       REFERRAL TO PHARMACIST     Referral to the pharmacist: No - specialty medication dose was changed, however patient has been counseled and pharmacist has reviewed.      SHIPPING     Shipping address confirmed in Epic.     Patient was notified of new phone menu : No    Delivery Scheduled: Yes, Expected medication delivery date: 4/10.     Medication will be delivered via Next Day Courier to the prescription address in Epic WAM.    Julianne Rice, PharmD   [facility] Specialty Pharmacist

## 2022-11-18 MED FILL — HYDROXYCHLOROQUINE 200 MG TABLET: ORAL | 30 days supply | Qty: 45 | Fill #3

## 2022-11-21 MED FILL — LOSARTAN 50 MG TABLET: ORAL | 90 days supply | Qty: 90 | Fill #1

## 2022-11-24 ENCOUNTER — Ambulatory Visit: Admit: 2022-11-24

## 2022-11-24 ENCOUNTER — Ambulatory Visit: Admit: 2022-11-24 | Discharge: 2022-11-25 | Payer: PRIVATE HEALTH INSURANCE

## 2022-11-25 DIAGNOSIS — M545 Chronic bilateral low back pain without sciatica: Principal | ICD-10-CM

## 2022-11-25 DIAGNOSIS — G8929 Other chronic pain: Principal | ICD-10-CM

## 2022-11-25 NOTE — Unmapped (Signed)
Oxycodone refill  Last ov: 11/11/2022  Next ov: 02/19/2023

## 2022-12-01 MED ORDER — OXYCODONE 5 MG TABLET
ORAL_TABLET | Freq: Every day | ORAL | 0 refills | 30 days | Status: CP
Start: 2022-12-01 — End: ?

## 2022-12-02 ENCOUNTER — Ambulatory Visit: Admit: 2022-12-02 | Discharge: 2022-12-03 | Payer: PRIVATE HEALTH INSURANCE

## 2022-12-02 DIAGNOSIS — Z79899 Other long term (current) drug therapy: Principal | ICD-10-CM

## 2022-12-02 LAB — CBC W/ AUTO DIFF
BASOPHILS ABSOLUTE COUNT: 0.1 10*9/L (ref 0.0–0.1)
BASOPHILS RELATIVE PERCENT: 0.9 %
EOSINOPHILS ABSOLUTE COUNT: 0 10*9/L (ref 0.0–0.5)
EOSINOPHILS RELATIVE PERCENT: 0.3 %
HEMATOCRIT: 41.5 % (ref 34.0–44.0)
HEMOGLOBIN: 13.8 g/dL (ref 11.3–14.9)
LYMPHOCYTES ABSOLUTE COUNT: 1.7 10*9/L (ref 1.1–3.6)
LYMPHOCYTES RELATIVE PERCENT: 22.2 %
MEAN CORPUSCULAR HEMOGLOBIN CONC: 33.3 g/dL (ref 32.0–36.0)
MEAN CORPUSCULAR HEMOGLOBIN: 28.6 pg (ref 25.9–32.4)
MEAN CORPUSCULAR VOLUME: 85.7 fL (ref 77.6–95.7)
MEAN PLATELET VOLUME: 7.9 fL (ref 6.8–10.7)
MONOCYTES ABSOLUTE COUNT: 0.7 10*9/L (ref 0.3–0.8)
MONOCYTES RELATIVE PERCENT: 8.7 %
NEUTROPHILS ABSOLUTE COUNT: 5.2 10*9/L (ref 1.8–7.8)
NEUTROPHILS RELATIVE PERCENT: 67.9 %
NUCLEATED RED BLOOD CELLS: 0 /100{WBCs} (ref <=4)
PLATELET COUNT: 214 10*9/L (ref 150–450)
RED BLOOD CELL COUNT: 4.84 10*12/L (ref 3.95–5.13)
RED CELL DISTRIBUTION WIDTH: 13.3 % (ref 12.2–15.2)
WBC ADJUSTED: 7.6 10*9/L (ref 3.6–11.2)

## 2022-12-02 LAB — CREATININE
CREATININE: 0.92 mg/dL
EGFR CKD-EPI (2021) FEMALE: 83 mL/min/{1.73_m2} (ref >=60)

## 2022-12-02 LAB — ALT: ALT (SGPT): 30 U/L (ref 10–49)

## 2022-12-02 LAB — AST: AST (SGOT): 31 U/L (ref <=34)

## 2022-12-05 MED FILL — HYDROXYZINE HCL 25 MG TABLET: ORAL | 30 days supply | Qty: 90 | Fill #1

## 2022-12-05 MED FILL — TIZANIDINE 2 MG TABLET: ORAL | 30 days supply | Qty: 90 | Fill #1

## 2022-12-10 DIAGNOSIS — N939 Abnormal uterine and vaginal bleeding, unspecified: Principal | ICD-10-CM

## 2022-12-10 MED ORDER — NORETHINDRONE (CONTRACEPTIVE) 0.35 MG TABLET
ORAL_TABLET | Freq: Every day | ORAL | 3 refills | 84 days
Start: 2022-12-10 — End: 2023-12-10

## 2022-12-11 MED ORDER — NORETHINDRONE (CONTRACEPTIVE) 0.35 MG TABLET
ORAL_TABLET | Freq: Every day | ORAL | 3 refills | 84 days | Status: CP
Start: 2022-12-11 — End: 2023-12-11
  Filled 2022-12-12: qty 84, 84d supply, fill #0

## 2022-12-12 MED FILL — HYDROXYCHLOROQUINE 200 MG TABLET: ORAL | 30 days supply | Qty: 45 | Fill #4

## 2022-12-15 NOTE — Unmapped (Signed)
Received VM from [facility] wanting to know if patient will continue Rituxmab infusions. Per note, patient does not have reliable transportation.     Spoke to [facility] and they are able to provide transportation for infusions.    Sent patient a Clinical cytogeneticist message with information below in case she is interested in restarting.     [facility]:  [phone]  Armandina Gemma

## 2022-12-22 ENCOUNTER — Ambulatory Visit: Admit: 2022-12-22 | Discharge: 2022-12-23 | Payer: PRIVATE HEALTH INSURANCE

## 2022-12-22 LAB — CREATININE
CREATININE: 0.89 mg/dL
EGFR CKD-EPI (2021) FEMALE: 86 mL/min/{1.73_m2} (ref >=60)

## 2022-12-22 LAB — CBC W/ AUTO DIFF
BASOPHILS ABSOLUTE COUNT: 0 10*9/L (ref 0.0–0.1)
BASOPHILS RELATIVE PERCENT: 0.7 %
EOSINOPHILS ABSOLUTE COUNT: 0 10*9/L (ref 0.0–0.5)
EOSINOPHILS RELATIVE PERCENT: 0.4 %
HEMATOCRIT: 40.4 % (ref 34.0–44.0)
HEMOGLOBIN: 13.3 g/dL (ref 11.3–14.9)
LYMPHOCYTES ABSOLUTE COUNT: 1.2 10*9/L (ref 1.1–3.6)
LYMPHOCYTES RELATIVE PERCENT: 17.6 %
MEAN CORPUSCULAR HEMOGLOBIN CONC: 33 g/dL (ref 32.0–36.0)
MEAN CORPUSCULAR HEMOGLOBIN: 28.1 pg (ref 25.9–32.4)
MEAN CORPUSCULAR VOLUME: 85 fL (ref 77.6–95.7)
MEAN PLATELET VOLUME: 8 fL (ref 6.8–10.7)
MONOCYTES ABSOLUTE COUNT: 0.5 10*9/L (ref 0.3–0.8)
MONOCYTES RELATIVE PERCENT: 6.8 %
NEUTROPHILS ABSOLUTE COUNT: 5.2 10*9/L (ref 1.8–7.8)
NEUTROPHILS RELATIVE PERCENT: 74.5 %
NUCLEATED RED BLOOD CELLS: 0 /100{WBCs} (ref <=4)
PLATELET COUNT: 229 10*9/L (ref 150–450)
RED BLOOD CELL COUNT: 4.75 10*12/L (ref 3.95–5.13)
RED CELL DISTRIBUTION WIDTH: 12.9 % (ref 12.2–15.2)
WBC ADJUSTED: 6.9 10*9/L (ref 3.6–11.2)

## 2022-12-22 LAB — AST: AST (SGOT): 62 U/L — ABNORMAL HIGH (ref <=34)

## 2022-12-22 LAB — ALT: ALT (SGPT): 60 U/L — ABNORMAL HIGH (ref 10–49)

## 2022-12-23 MED FILL — OMEPRAZOLE 20 MG CAPSULE,DELAYED RELEASE: ORAL | 90 days supply | Qty: 90 | Fill #1

## 2022-12-26 DIAGNOSIS — G8929 Other chronic pain: Principal | ICD-10-CM

## 2022-12-26 NOTE — Unmapped (Signed)
Oxycodone     Last Visit Date: 11/11/2022  Next Visit Date: 03/06/2023

## 2022-12-29 ENCOUNTER — Ambulatory Visit
Admit: 2022-12-29 | Discharge: 2022-12-30 | Payer: PRIVATE HEALTH INSURANCE | Attending: Retina Specialist | Primary: Retina Specialist

## 2022-12-29 DIAGNOSIS — Z79899 Other long term (current) drug therapy: Principal | ICD-10-CM

## 2022-12-29 NOTE — Unmapped (Signed)
[name] is here for screening for Plaquenil retinopathy, referred by Julia Senters, FNP.    Assessment:       1. Long-term use of plaquenil for lupus  No evidence of plaquenil toxicity    Duration of use: 15 years,  200mg   daily PO. This is equivalent to 2.41mg /kg/day.    Risk factors: No liver disease. No renal dysfunction. No history of use of tamoxifen    I reviewed the patient's current tests to see if there were signs of retinal toxicity.  The tests done provide non-duplicative information to detect signs of retinal toxicity in terms of retinal structure and function.    Clinical findings: Within normal limits     Fundus autofluorescence (FAF):  Within normal limits    Spectral domain optical coherence tomography (SD-OCT): Within normal limits   Humphrey visual field 10-2 :Within normal limits     Overall, my evaluation today indicates that the patient has no retinal toxicity due to plaquenil.    2. Myopic astigmatism OU  - MRX done. New glasses rx given today        Plan:       I recommend that we see the patient again in 1 year to repeat the evaluation with clinical exam, FAF, SD-OCT.  No follow-ups on file.  or sooner PRN.

## 2022-12-31 MED ORDER — OXYCODONE 5 MG TABLET
ORAL_TABLET | Freq: Every day | ORAL | 0 refills | 30 days | Status: CP
Start: 2022-12-31 — End: ?

## 2023-01-02 MED FILL — TIZANIDINE 2 MG TABLET: ORAL | 30 days supply | Qty: 90 | Fill #2

## 2023-01-08 NOTE — Unmapped (Signed)
PAP APPROVAL

## 2023-01-12 MED FILL — HYDROXYCHLOROQUINE 200 MG TABLET: ORAL | 30 days supply | Qty: 45 | Fill #5

## 2023-01-25 DIAGNOSIS — M3219 Other organ or system involvement in systemic lupus erythematosus: Principal | ICD-10-CM

## 2023-01-25 MED ORDER — MYCOPHENOLATE MOFETIL 500 MG TABLET
ORAL_TABLET | Freq: Two times a day (BID) | ORAL | 0 refills | 90 days
Start: 2023-01-25 — End: 2024-01-25

## 2023-01-26 DIAGNOSIS — Z3041 Encounter for surveillance of contraceptive pills: Principal | ICD-10-CM

## 2023-01-26 MED ORDER — JUNEL 1/20 (21) 1 MG-20 MCG TABLET
ORAL_TABLET | Freq: Every day | ORAL | 3 refills | 0.00000 days
Start: 2023-01-26 — End: ?

## 2023-01-26 NOTE — Unmapped (Signed)
Mycophenolate refill  Last Visit Date: 11/11/2022  Next Visit Date: 03/06/2023    Lab Results   Component Value Date    ALT 60 (H) 12/22/2022    AST 62 (H) 12/22/2022    ALBUMIN 3.8 10/17/2022    CREATININE 0.89 12/22/2022     Lab Results   Component Value Date    WBC 6.9 12/22/2022    HGB 13.3 12/22/2022    HCT 40.4 12/22/2022    PLT 229 12/22/2022     Lab Results   Component Value Date    NEUTROPCT 74.5 12/22/2022    LYMPHOPCT 17.6 12/22/2022    MONOPCT 6.8 12/22/2022    EOSPCT 0.4 12/22/2022    BASOPCT 0.7 12/22/2022

## 2023-01-27 MED ORDER — MYCOPHENOLATE MOFETIL 500 MG TABLET
ORAL_TABLET | Freq: Two times a day (BID) | ORAL | 0 refills | 90 days | Status: CP
Start: 2023-01-27 — End: 2024-01-27
  Filled 2023-01-29: qty 540, 90d supply, fill #0

## 2023-01-28 MED ORDER — JUNEL 1/20 (21) 1 MG-20 MCG TABLET
ORAL_TABLET | Freq: Every day | ORAL | 3 refills | 84.00000 days | Status: CP
Start: 2023-01-28 — End: ?

## 2023-01-28 NOTE — Unmapped (Signed)
[facility] Refill Coordination Note    Specialty Medication(s) to be Shipped:   General Specialty: mycophenolate 500mg     Other medication(s) to be shipped:  Hydroxychloroquine (too soon until 6/27)     Julia Bates, DOB: June 11, 1986  Phone: [phone] (home)       All above HIPAA information was verified with patient.     Was a Nurse, learning disability used for this call? No    Completed refill call assessment today to schedule patient's medication shipment from the [facility] [phone]).  All relevant notes have been reviewed.     Specialty medication(s) and dose(s) confirmed: Regimen is correct and unchanged.   Changes to medications: Julia Bates reports no changes at this time.  Changes to insurance: No  New side effects reported not previously addressed with a pharmacist or physician: None reported  Questions for the pharmacist: No    Confirmed patient received a Conservation officer, historic buildings and a Surveyor, mining with first shipment. The patient will receive a drug information handout for each medication shipped and additional FDA Medication Guides as required.       DISEASE/MEDICATION-SPECIFIC INFORMATION        N/A    SPECIALTY MEDICATION ADHERENCE              Were doses missed due to medication being on hold? No    Mycophenolate 500 mg: 14 days of medicine on hand       REFERRAL TO PHARMACIST     Referral to the pharmacist: Not needed      [facility]     Shipping address confirmed in Epic.       Delivery Scheduled: Yes, Expected medication delivery date: 01/30/2023. 02/06/2023 for Hydroxychloroquine.     Medication will be delivered via UPS to the prescription address in Epic WAM.    Elnora Morrison, PharmD   [facility] Specialty Pharmacist

## 2023-01-29 ENCOUNTER — Ambulatory Visit: Admit: 2023-01-29 | Discharge: 2023-01-29 | Payer: PRIVATE HEALTH INSURANCE

## 2023-01-29 DIAGNOSIS — M3219 Other organ or system involvement in systemic lupus erythematosus: Principal | ICD-10-CM

## 2023-01-29 DIAGNOSIS — G8929 Other chronic pain: Principal | ICD-10-CM

## 2023-01-29 DIAGNOSIS — N879 Dysplasia of cervix uteri, unspecified: Principal | ICD-10-CM

## 2023-01-29 LAB — CBC W/ AUTO DIFF
BASOPHILS ABSOLUTE COUNT: 0.1 10*9/L (ref 0.0–0.1)
BASOPHILS RELATIVE PERCENT: 0.8 %
EOSINOPHILS ABSOLUTE COUNT: 0 10*9/L (ref 0.0–0.5)
EOSINOPHILS RELATIVE PERCENT: 0.5 %
HEMATOCRIT: 38.4 % (ref 34.0–44.0)
HEMOGLOBIN: 12.5 g/dL (ref 11.3–14.9)
LYMPHOCYTES ABSOLUTE COUNT: 1.4 10*9/L (ref 1.1–3.6)
LYMPHOCYTES RELATIVE PERCENT: 19.5 %
MEAN CORPUSCULAR HEMOGLOBIN CONC: 32.6 g/dL (ref 32.0–36.0)
MEAN CORPUSCULAR HEMOGLOBIN: 27.7 pg (ref 25.9–32.4)
MEAN CORPUSCULAR VOLUME: 85.1 fL (ref 77.6–95.7)
MEAN PLATELET VOLUME: 7.6 fL (ref 6.8–10.7)
MONOCYTES ABSOLUTE COUNT: 0.7 10*9/L (ref 0.3–0.8)
MONOCYTES RELATIVE PERCENT: 9.8 %
NEUTROPHILS ABSOLUTE COUNT: 5.1 10*9/L (ref 1.8–7.8)
NEUTROPHILS RELATIVE PERCENT: 69.4 %
NUCLEATED RED BLOOD CELLS: 0 /100{WBCs} (ref <=4)
PLATELET COUNT: 253 10*9/L (ref 150–450)
RED BLOOD CELL COUNT: 4.51 10*12/L (ref 3.95–5.13)
RED CELL DISTRIBUTION WIDTH: 13.1 % (ref 12.2–15.2)
WBC ADJUSTED: 7.3 10*9/L (ref 3.6–11.2)

## 2023-01-29 LAB — CREATININE
CREATININE: 0.95 mg/dL
EGFR CKD-EPI (2021) FEMALE: 79 mL/min/{1.73_m2} (ref >=60)

## 2023-01-29 LAB — AST: AST (SGOT): 34 U/L (ref <=34)

## 2023-01-29 LAB — ALT: ALT (SGPT): 29 U/L (ref 10–49)

## 2023-01-29 MED ORDER — PREDNISONE 1 MG TABLET
ORAL_TABLET | Freq: Every day | ORAL | 0 refills | 30 days | Status: CP
Start: 2023-01-29 — End: ?
  Filled 2023-01-29: qty 90, 30d supply, fill #0

## 2023-01-29 MED FILL — HYDROXYZINE HCL 25 MG TABLET: ORAL | 30 days supply | Qty: 90 | Fill #2

## 2023-01-29 NOTE — Unmapped (Signed)
Oxycodone refill  Last ov: 11/11/2022  Next ov: 01/29/2023

## 2023-01-29 NOTE — Unmapped (Signed)
Orders:    Pap Smear

## 2023-01-29 NOTE — Unmapped (Signed)
[facility] called. They contacted pt to let her know they are going to discharge her because she is no longer on Saphnelo and pt stated she is supposed to start Rituxan.  [facility] does not have orders.  Per last Rheum note pt was not sure she could do Rituxan d/t transportation issues.  [facility] plans to discharge pt by 6/26 if no new orders are received.  Pt's last appt was in [place] 2023    NP notified

## 2023-01-29 NOTE — Unmapped (Signed)
Assessment & Plan  Cervical dysplasia    Orders:    Pap Smear    >review the Pap/HR-HPV result; further evaluation/management in keeping w/ASCCP risk-based guidelines     Satisfied w/minipill/no side effects    Follow-up prn or in 83yr    I personally spent 25 minutes face-to-face and non-face-to-face in the care of this patient, which includes all pre, intra, and post visit time on the date of service.  All documented time was specific to the E/M visit and does not include any procedures that may have been performed.             Patient ID: Julia Bates, female   DOB: 01-02-1986, 36 y.o.   MRN: [id number]    No chief complaint on file.      HPI  Julia Bates is a 37 y.o. female.  She returns for a repeat Pap.  Started on minipill at last visit.  No issues/concerns.    Pap/dysplasia history:  9/07 Colpo--neg bx  2016-- LEEP  1/17--PAP NILM/HrHPV+  11/17-neg ECC  11/19-PAP NILM/HrHPV+  12/19 ECC pos CIN 1  11/20-ASC-H  12/20-neg colpo; CIN 1  3/23 ASCUS  6/23 neg colpo    Review of Systems  Review of Systems  Constitutional: negative for fatigue and weight loss  Respiratory: negative for cough and wheezing  Cardiovascular: negative for chest pain, fatigue and palpitations  Gastrointestinal: negative for abdominal pain and change in bowel habits  Genitourinary:negative for abnormal discharge  Integument/breast: negative for nipple discharge  Musculoskeletal:negative for myalgias  Neurological: negative for gait problems and tremors  Behavioral/Psych: negative for abusive relationship, depression  Endocrine: negative for temperature intolerance        The following portions of the patient's history were reviewed and updated as appropriate: allergies, current medications, past family history, past medical history, past social history, past surgical history, and problem list.    BP 119/83 (BP Site: R Arm, BP Position: Sitting, BP Cuff Size: Medium)  - Pulse 83  - Temp 36.2 ??C (97.2 ??F) (Temporal)  - Ht 149.9 cm (4' 11)  - Wt 61.3 kg (135 lb 1.6 oz)  - BMI 27.29 kg/m??      Physical Exam  Physical Exam  The sensitive parts of the examination were performed with a chaperone.  General:   alert   Pelvis:  External genitalia: normal general appearance  Urinary system: urethral meatus normal and bladder without fullness, nontender  Vaginal: normal without tenderness, induration or masses  Cervix: normal appearance  Adnexa: normal bimanual exam  Uterus: anteverted and non-tender, normal size

## 2023-01-29 NOTE — Unmapped (Signed)
Prednisone refill  Last ov: 11/11/2022  Next ov: 03/06/2023

## 2023-01-30 DIAGNOSIS — G8929 Other chronic pain: Principal | ICD-10-CM

## 2023-01-30 MED ORDER — OXYCODONE 5 MG TABLET
ORAL_TABLET | Freq: Every day | ORAL | 0 refills | 30 days | Status: CP | PRN
Start: 2023-01-30 — End: ?

## 2023-02-05 MED FILL — HYDROXYCHLOROQUINE 200 MG TABLET: ORAL | 30 days supply | Qty: 45 | Fill #6

## 2023-02-06 DIAGNOSIS — B379 Candidiasis, unspecified: Principal | ICD-10-CM

## 2023-02-06 MED ORDER — FLUCONAZOLE 150 MG TABLET
ORAL_TABLET | Freq: Once | ORAL | 1 refills | 1 days | Status: CP
Start: 2023-02-06 — End: 2023-02-06

## 2023-02-06 NOTE — Unmapped (Signed)
Nurse called patient from [facility] at [facility] Nurse Triage line with message from provider Dr. Antionette Char:  The Pap test showed atypical cells with a persistent positive HPV result.  Inform her also of the positive yeast testing.  Offer either Diflucan 150 mg po x 1 repeat in 72 hrs--Disp #2 or Terazol 7.  Recommend repeat Pap w/HPV testing in 1 yr.   Pt requests po treatment for yeast infection, verified pharmacy.

## 2023-02-06 NOTE — Unmapped (Signed)
Attempted to call pt. No answer. LVM with call back number.     -----Roseanna Rainbow, MD  [facility] Staff  Call pt.  The Pap test showed atypical cells with a persistent positive HPV result.  Inform her also of the positive yeast testing.  Offer either Diflucan 150 mg po x 1 repeat in 72 hrs--Disp #2 or Terazol 7.  Recommend repeat Pap w/HPV testing in 1 yr.

## 2023-02-09 ENCOUNTER — Ambulatory Visit: Admit: 2023-02-09 | Discharge: 2023-02-09 | Payer: PRIVATE HEALTH INSURANCE

## 2023-02-09 ENCOUNTER — Ambulatory Visit
Admit: 2023-02-09 | Discharge: 2023-02-09 | Payer: PRIVATE HEALTH INSURANCE | Attending: Student in an Organized Health Care Education/Training Program | Primary: Student in an Organized Health Care Education/Training Program

## 2023-02-09 DIAGNOSIS — M545 Chronic bilateral low back pain without sciatica: Principal | ICD-10-CM

## 2023-02-09 DIAGNOSIS — R109 Unspecified abdominal pain: Principal | ICD-10-CM

## 2023-02-09 DIAGNOSIS — M8580 Other specified disorders of bone density and structure, unspecified site: Principal | ICD-10-CM

## 2023-02-09 DIAGNOSIS — M7918 Myalgia, other site: Principal | ICD-10-CM

## 2023-02-09 DIAGNOSIS — G8929 Other chronic pain: Principal | ICD-10-CM

## 2023-02-09 DIAGNOSIS — M3212 Pericarditis in systemic lupus erythematosus: Principal | ICD-10-CM

## 2023-02-09 LAB — URINALYSIS, MACROSCOPIC
BILIRUBIN UA: NEGATIVE
GLUCOSE UA: NEGATIVE
KETONES UA: NEGATIVE
NITRITE UA: NEGATIVE
PH UA: 6 (ref 5.0–9.0)
PROTEIN UA: NEGATIVE
SPECIFIC GRAVITY UA: >=1.03 (ref 1.005–1.030)
UROBILINOGEN UA: 0.2

## 2023-02-09 LAB — BASIC METABOLIC PANEL
ANION GAP: 9 mmol/L (ref 5–14)
BLOOD UREA NITROGEN: 9 mg/dL (ref 9–23)
BUN / CREAT RATIO: 10
CALCIUM: 9.7 mg/dL (ref 8.7–10.4)
CHLORIDE: 110 mmol/L — ABNORMAL HIGH (ref 98–107)
CO2: 24.1 mmol/L (ref 20.0–31.0)
CREATININE: 0.88 mg/dL
EGFR CKD-EPI (2021) FEMALE: 87 mL/min/{1.73_m2} (ref >=60)
GLUCOSE RANDOM: 131 mg/dL (ref 70–179)
POTASSIUM: 3.4 mmol/L (ref 3.4–4.8)
SODIUM: 143 mmol/L (ref 135–145)

## 2023-02-09 LAB — PARATHYROID HORMONE (PTH): PARATHYROID HORMONE INTACT: 44.1 pg/mL (ref 18.4–80.1)

## 2023-02-09 NOTE — Unmapped (Signed)
I will be in touch with your results.  If your pain worsens, please see PCP or call Dr. Lisabeth Register or be seen in urgent care/ED.

## 2023-02-09 NOTE — Unmapped (Signed)
Endocrine Consult Note    Assessment and Plan. [name] is a 37 y.o.  with a history of SLE and ostepenia who is seen for evaluation of osteopenia and .    1. Osteopenia. At risk due to long-term steroid use and amenorrhea. Previously put on OCPs, but with recent clot, now on progesterone only for the last 6 months. She is now amenorrheic, but this may be due to thinned uterine lining. Prior DEXA has been largely stable with improvement in the spine. Due for repeat DEXA - which may be impacted by loss of estrogen. Will repeat DEXA and determine best next steps. At next visit, we should consider repeat estradiol to ensure that she is getting some bone protection from endogenous estrogen.  Estradiol should be obtained at next visit  Repeat DEXA scan to determine next steps  Encouraged calcium/vitamin D supplementation and weight bearing exercise  Repeat PTH given concern for kidney stone as below.  I will see her back in 1 year unless otherwise indicated based on DEXA scan.    2. Flank pain. Significant concern today. Obtained urinalysis which demonstrated trace blood and leukocyte estrace - will recommend that she follow-up with PCP for full work up with some concern for kidney stone vs. Infection. PTH pending.    3. Amenorrhea. Likely secondary to norethindrone contraception.      [name], MD, PhD  Division of Endocrinology, Diabetes, and Metabolism  Phone:  [phone]   Fax:  [phone]    ----------------------------  Reason For Visit: Osteopenia.     Subjective:     History of Present Illness. Julia Bates is a 37 y.o. with a history of SLE and osteopoenia who I am asked to see in consultation by Dr. Jonnie Finner [facility] for follow-up of osteopenia.    Background. Initially, presented to see Dr. Amalia Hailey for ammenorrhea. She was put on a birth control and started having periods. She switched from estrogen in 05/2022 because of blood clots. She stopped blood thinners in April. She started seeing Dr. Eda Paschal for osteopenia. She started doing blood work. She has not had a bone mineral density since then. She has not fractured. Her steroid dose goes up and down, because when she wakes up she have pain. She is down to 3mg  but she is having pain. She is planning to start rituximab. She has had lupus since 2011.     Primary risk factors are amenorrhea and steroid use. Otherwise, she denies falls. PTH, vitamin D, and calcium has been appropriate.    Dexa:  2022: Z-score: Fem neck -2.0; Total hip: -1.8, Total spine -0.1.  2019: Z-score: Fem neck -2.2; Total hip: -1.9, Total spine -1.2.    She mostly came because she is having pain in her flank. She says it comes in the afternoon. It feels like stabbing pain. She drinks a lot of water and it seems to improve.       Per last note by Dr. Eda Paschal  Osteopenia:  02/15/18 DXA: LS Z -1.2, TF 1 -1.9, Fem neck T -2.3. Not changed sig since last DXA in 2017.   Calcium 9.6, vitamin D 30, Alk phos 87. PTH WNL.   Risk factors: steroid use history and history of amenorrhea.         Medical History Surgical History   Lupus  Blood clots (2023) Kidney biopsy   Family History Social History   Family History   Problem Relation Age of Onset    Glaucoma  Father     Diabetes Father     Hypertension Father     No Known Problems Mother     No Known Problems Sister     Diabetes Sister     Diabetes Sister     Melanoma Neg Hx     Basal cell carcinoma Neg Hx     Squamous cell carcinoma Neg Hx     Macular degeneration Neg Hx     Blindness Neg Hx       Lives with family  If she can, she hangs out with boyfriend  No alcohol, non-smoker       Allergies. Reviewed in Epic  Medications. Reviewed in Epic    Objective:     Physical Exam.  BP 108/71  - Pulse 85  - Temp 36.9 ??C (98.4 ??F) (Skin)  - Wt 61.7 kg (136 lb)  - BMI 27.47 kg/m??   Wt Readings from Last 3 Encounters:   02/09/23 61.9 kg (136 lb 6.4 oz)   02/09/23 61.7 kg (136 lb)   01/29/23 61.3 kg (135 lb 1.6 oz)     Constitutional - alert, well appearing, no acute distress  Eyes- Sclera anicteric, no lid lag or stare, EOM's intact  CV - RRR   Resp - breathing comfortably  GI - soft, nontender, nondistended  MSK - no obvious joint deformity, significant right costovertebral angle tenderness, not reproduced ont he left.  Neurological - Mental status - alert, oriented   Lymph - no pedal edema  Skin - normal coloration and turgor, no rashes  Psych - normal affect

## 2023-02-16 LAB — VITAMIN D 25 HYDROXY: VITAMIN D, TOTAL (25OH): 28.8 ng/mL (ref 20.0–80.0)

## 2023-02-16 NOTE — Unmapped (Addendum)
active coverage found

## 2023-02-19 NOTE — Unmapped (Unsigned)
Assessment and Plan:     Flank pain  Patient presents today with right flank pain. POCT urinalysis completed in clinic today, inconclusive. Discussed that since pain was reproducible and moderate CVAT it is reasonable to suspect that pain is stemming from the renal system. Recommended obtaining imaging of the kidneys, patient agreeable. If imaging is inconclusive will pursue further testing as outlined to patient. Continue with tylenol and pushing fluids. If symptoms worsen or persist contact this provider.  - POCT urinalysis dipstick  - US Renal Limited; Future    Microscopic hematuria  - US Renal Limited; Future    Costovertebral angle tenderness  - US Renal Limited; Future         I personally spent *** minutes face-to-face and non-face-to-face in the care of this patient, which includes all pre, intra, and post visit time on the date of service.    Return if symptoms worsen or fail to improve.    HPI:      Julia Bates is here for   Chief Complaint   Patient presents with    Flank Pain     RT Flank Pain x 2 weeks.       Patient presents today with right flank pain x 3 weeks. She says that the pain is intermittent and sometimes keeps her up at night. She does experience pain to palpation and CVAT. Patient also notes that there was blood in her urine on July first which was discovered at endocrinology. Denies N/V/D, constipation, dyspepsia, excess belching/flatulence, hematuria, dysuria, history of kidney infection or stones, trauma to the back. Endorses staying hydrated.    Hypertension: Patient presents for follow-up of hypertension. Blood pressure goal < 140/90.  Hypertension has customarily not been at goal complicated by recent respiratory illness and decongestant use.  Home blood pressure readings: Her blood pressure has been running high. Salt intake and diet: salt not added to cooking. Associated signs and symptoms:  dizziness . Patient denies: blurred vision, chest pain, dyspnea, headache, neck aches, orthopnea, palpitations, paroxysmal nocturnal dyspnea, peripheral edema, pulsating in the ears, and tiredness/fatigue. Medication compliance: taking as prescribed. She is not doing regular exercise.      ROS:      Comprehensive 10 point ROS negative unless otherwise stated in the HPI.      [facility] Components:     Medication adherence and barriers to the treatment plan have been addressed. Opportunities to optimize healthy behaviors have been discussed. Patient / caregiver voiced understanding.    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    ECC pos CIN 1--12/19    Essential hypertension 09/23/2022    GERD (gastroesophageal reflux disease) Oct 23    HPV (human papilloma virus) infection     Lupus (CMS-HCC)     Lupus (CMS-HCC)     Rheumatoid arthritis (CMS-HCC) 11/18/2015    SLE (systemic lupus erythematosus related syndrome) (CMS-HCC)      Past Surgical History:   Procedure Laterality Date    CERVICAL BIOPSY  W/ LOOP ELECTRODE EXCISION      COLPOSCOPY      SKIN BIOPSY  2013-2014       Family History:     Family History  Problem Relation Age of Onset    Glaucoma Father     Diabetes Father     Hypertension Father     No Known Problems Mother     No Known Problems Sister     Diabetes Sister     Diabetes Sister     Melanoma Neg Hx     Basal cell carcinoma Neg Hx     Squamous cell carcinoma Neg Hx     Macular degeneration Neg Hx     Blindness Neg Hx        Social History:     Social History     Tobacco Use    Smoking status: Never    Smokeless tobacco: Never    Tobacco comments:     None   Vaping Use    Vaping status: Never Used   Substance Use Topics    Alcohol use: Never    Drug use: Never       Allergies:     Sulfa (sulfonamide antibiotics)    Current Medications:     Current Outpatient Medications   Medication Sig Dispense Refill    calcium carbonate-vitamin D3 600 mg(1,500mg ) -200 unit per tablet Take 1 tablet by mouth Two (2) times a day.      hydroxychloroquine (PLAQUENIL) 200 mg tablet Take 1.5 tablets (300 mg total) by mouth daily. If unable to tolerate the smell of splitting tab - can take 200mg  on MWF, 400mg  other days of the week 45 tablet 11    hydrOXYzine (ATARAX) 25 MG tablet Take 1 tablet (25 mg total) by mouth every eight (8) hours as needed. 90 tablet 3    losartan (COZAAR) 50 MG tablet Take 1 tablet (50 mg total) by mouth daily. 90 tablet 3    melatonin 5 mg tablet Take 1 tablet (5 mg total) by mouth nightly.      mycophenolate (CELLCEPT) 500 mg tablet Take 3 tablets (1,500 mg total) by mouth two (2) times a day. 540 tablet 0    norethindrone (MICRONOR) 0.35 mg tablet Take 1 tablet by mouth daily. 84 tablet 3    omeprazole (PRILOSEC) 20 MG capsule Take 1 capsule (20 mg total) by mouth daily. 90 capsule 3    oxyCODONE (ROXICODONE) 5 MG immediate release tablet Take 1 tablet (5 mg total) by mouth daily as needed for pain. 30 tablet 0    predniSONE (DELTASONE) 1 MG tablet Take 3 tablets (3 mg total) by mouth daily. 90 tablet 0    tizanidine (ZANAFLEX) 2 MG tablet Take 1 tablet (2 mg total) by mouth every eight (8) hours as needed. 90 tablet 3    fluconazole (DIFLUCAN) 150 MG tablet TAKE 1 TABLET (150 MG TOTAL) BY MOUTH ONCE FOR 1 DOSE. REPEAT IN 72HOURS IF NEEDED      miscellaneous medical supply Misc Pt needs a set of crutches. 1 each 0     No current facility-administered medications for this visit.       Health Maintenance:     Health Maintenance   Topic Date Due    COVID-19 Vaccine (3 - Pfizer risk series) 10/27/2020    Pneumococcal Vaccine 0-64 (3 of 3 - PPSV23 or PCV20) 10/28/2023 (Originally 08/06/2020)    Influenza Vaccine (1) 04/12/2023    Serum Creatinine Monitoring  02/09/2024    Potassium Monitoring  02/09/2024    DTaP/Tdap/Td Vaccines (4 - Td or Tdap) 04/09/2025    HPV Cotest with Pap Smear (21-65) 01/30/2028    Pap  Smear with Cotest HPV (21-65)  02/04/2028    Hepatitis C Screen  Completed       Immunizations:     Immunization History   Administered Date(s) Administered    COVID-19 VAC,MRNA,TRIS(12Y UP)(PFIZER)(GRAY CAP) 09/08/2020    COVID-19 VACC,MRNA,(PFIZER)(PF) 09/29/2020    DTP 12/08/1990    HPV Quadrivalent (Gardasil) 03/24/2006, 06/01/2006, 10/02/2006    Influenza Vaccine Quad(IM)6 MO-Adult(PF) 06/05/2015, 05/06/2016, 07/07/2017, 07/06/2018, 04/29/2019, 05/17/2020    Influenza Virus Vaccine, unspecified formulation 05/06/2016, 07/07/2017    MMR 12/08/1990    Meningococcal C Conjugate 03/08/2004    Novel Influenza-h1n1-09, All Formulations 07/20/2008    PNEUMOCOCCAL POLYSACCHARIDE 23-VALENT 08/07/2015    PPD Test 05/20/2009    Pneumococcal Conjugate 13-Valent 04/10/2015    SHINGRIX-ZOSTER VACCINE (HZV),RECOMBINANT,ADJUVANTED(IM) 12/04/2021, 07/05/2022    TdaP 02/16/2012, 04/10/2015     I have reviewed and (if needed) updated the patient's problem list, medications, allergies, past medical and surgical history, social and family history.     Vital Signs:     Wt Readings from Last 3 Encounters:   02/25/23 62.6 kg (138 lb)   02/09/23 61.9 kg (136 lb 6.4 oz)   02/09/23 61.7 kg (136 lb)     Temp Readings from Last 3 Encounters:   02/25/23 36.8 ??C (98.3 ??F) (Oral)   02/09/23 36.9 ??C (98.4 ??F) (Skin)   02/09/23 36.9 ??C (98.4 ??F) (Skin)     BP Readings from Last 3 Encounters:   02/25/23 112/76   02/09/23 108/71   01/29/23 119/83     Pulse Readings from Last 3 Encounters:   02/25/23 84   02/09/23 85   01/29/23 83     Estimated body mass index is 27.87 kg/m?? as calculated from the following:    Height as of this encounter: 149.9 cm (4' 11).    Weight as of this encounter: 62.6 kg (138 lb).  Facility age limit for growth %iles is 20 years.      Objective:      General: Alert and oriented x3. Well-appearing. No acute distress.   HEENT:  Normocephalic.  Atraumatic. Conjunctiva and sclera normal.   Neck:  Supple. Heart:  Regular rate and rhythm. Normal S1, S2. No murmurs, rubs or gallops.   Lungs:  No respiratory distress.  Lungs clear to auscultation. No wheezes, rhonchi, or rales.   GI/GU:  Soft, +BS, nondistended. No palpable masses or organomegaly. Moderate right CVAT.  Skin:  Warm, dry. No rash or lesions present on visible skin.  Neuro:  Non-focal. No obvious weakness.   Psych:  Affect normal, eye contact good, speech clear and coherent.        I attest that I, Arta Bruce, personally documented this note while acting as scribe for Noralyn Pick, FNP.      Arta Bruce, Scribe.  02/25/2023     The documentation recorded by the scribe accurately reflects the service I personally performed and the decisions made by me.     Noralyn Pick, FNP

## 2023-02-25 ENCOUNTER — Ambulatory Visit: Admit: 2023-02-25 | Discharge: 2023-02-26 | Payer: PRIVATE HEALTH INSURANCE | Attending: Family | Primary: Family

## 2023-02-25 DIAGNOSIS — M549 Dorsalgia, unspecified: Principal | ICD-10-CM

## 2023-02-25 DIAGNOSIS — R109 Unspecified abdominal pain: Principal | ICD-10-CM

## 2023-02-25 DIAGNOSIS — R3129 Other microscopic hematuria: Principal | ICD-10-CM

## 2023-02-27 DIAGNOSIS — G8929 Other chronic pain: Principal | ICD-10-CM

## 2023-02-27 MED ORDER — OXYCODONE 5 MG TABLET
ORAL_TABLET | Freq: Every day | ORAL | 0 refills | 30 days | Status: CP | PRN
Start: 2023-02-27 — End: ?

## 2023-02-27 NOTE — Unmapped (Signed)
Oxycodone refill  Last Visit Date: 11/11/2022  Next Visit Date: 03/06/2023

## 2023-03-03 NOTE — Unmapped (Signed)
Assessment/Plan:   Julia Bates is a 37 y.o. female with history of SLE who returns today for follow up.     1. SLE: Characterized by rash, arthralgias, hypocomplementemia, pericarditis, remote history of nephritis. Today continues to have joint symptoms and rash. Scheduled to receive Rituxan 03/2023.   - Continue Cellcept 1500mg  BID for now. Will check with Dr. Marland Mcalpine about if should decreased once she starts Rituxan.  - Continue prednisone 3mg  daily for now. Decrease to 2mg  daily after second Rituxan infusion.    - Will receive Rituxan 1000mg  x2 03/2023.   - Continue Plaquenil 200 mg once daily. Last eye exam 12/2022.  - Continue to follow up with Cardiology for prior episode of pericarditis.   - Recent monitoring labs from 02/09/23 independently reviewed and overall stable. Update CBCw/diff, dsDNA, C3/C4, UPC, UA    2. Chronic lower back pain:   - Continue tizanidine 2 mg nightly.  - Continue oxycodone 5 mg once daily.  Pt reports inability to taper further.   - She reports plan to establish with local pain management and will discuss taking over oxycodone with them.   - UDS completed 10/21/22  - Continue F/U with Ortho, last visit 11/04/22.     3. H/o osteopenia: Follows with Endocrinology  - Continue daily Vitamin D and Calcium.    4. Fatty liver disease - Previous abdominal US suggestive of fatty liver disease. Did not discuss today. Patient should otherwise continue to limit alcohol, increase exercise, and eat a healthy low fat diet.    -Continue to follow up with PCP.    F/U as scheduled in October with Dr. Marland Mcalpine    I personally spent 47 minutes face-to-face and non-face-to-face in the care of this patient, which includes all pre, intra, and post visit time on the date of service.  All documented time was specific to the E/M visit and does not include any procedures that may have been performed.        History of Present Illness:     Primary Care Provider: Loran Senters, FNP    Chief Complaint: Follow up SLE    HPI:  Julia Bates is a 37 y.o.  female with a h/o SLE who presents for a follow up visit. Patient has a h/o prior nephritis in the distant past; also has rash and joint pain. She was previously on  Benlysta which was stopped given it is unclear if it was helping her. In 2016, patient was in a clinical trial of an anti-CD28 inhibitor and had to be withdrawn from the study due to prolonged healing from cellulitis. She has been treated with cellcept in the past, but this was discontinued in 12/2016 due to pt desire to conceive. Started on imuran in 12/2016. Was unable to go above 50 mg imuran qd due to worsening leukopenia and no longer taking.  She continues on Plaquenil 200mg  daily (this was changed from twice daily by her ophthalmologist in the past). She also has a hx of chronic lower back pain, for which she has been on oxycodone.     Hospitalized 05/2022 due to progressive, painful, tender rash on medial left lower thigh and lateral left upper thigh. 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 Dermatology and rheumatology were consulted and she was started on prednisone taper and Cellcept. Also started on Eliquis to address the thrombotic element of her  presentation.     Saphnelo discontinued 05/2022 due to continued disease activity.     09/2022 pt switched back to cellcept from Myfortic due to GERD.     Last Visit: 11/11/22 with myself      Interim History:   Pt presents for f/u today. She is taking cellcept 1500mg  BID and tolerating though still has some nausea. She is on prednisone 3mg  daily. She is taking HCQ and had stable eye exam 12/2022. She continues to have joint pain. She reports stiffness especially to fingers. AM stiffness will last a couple hours. She reports some swelling to ankles in evening but resolved by morning. She is scheduled to have Rituxan in August. She continues to have lacy rash to legs and reports this as stable. This is no longer itchy. No recent oral ulcers. She recently developed flank pain which she saw her PCP about recently. Her most recent urine showed trace protein but no blood. She plans to have [place] of kidneys. No recent fever. She is taking oxycodone one tablet daily and hasn't been able to tolerate lower dose. She was referred to pain clinic and waiting to hear about scheduling.     Balance of 10 systems was reviewed and is negative except for that mentioned in the HPI.    Review of records: I have reviewed labs/images/clinic notes per the computerized medical record.     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  One recent report suggests that the sensitivity of IgA detection by DIF is cases of IgA vasculitis is 81% using the EULAR criteria as the gold standard for diagnosis.  The specificity of DIF for the diagnosis of IgA vasculitis is 83%.  Therefore, it is important to use clinical criteria in making the diagnosis as false negatives and false positive results can occur.  This study is consists of a predominantly adult inpatient population.     Skin biopsy 05/21/2022:  Left leg, punch  Diagnosis:  - Intravascular fibrin thrombi with sparse to moderately dense perivascular mixed inflammation with neutrophils and eosinophils and microhemorrhage. See comment.   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  A mixed pattern such as this can be seen in the context of connective tissue disease, levamisole, cryoglobulinemia, drug/medication.  Clinical correlation is recommended.    Allergies:  Sulfa (sulfonamide antibiotics)    Medications:     Current Outpatient Medications:     calcium carbonate-vitamin D3 600 mg(1,500mg ) -200 unit per tablet, Take 1 tablet by mouth Two (2) times a day., Disp: , Rfl:     ergocalciferol, vitamin D2, (VITAMIN D2 ORAL), Take 1,000 Units by mouth daily with evening meal., Disp: , Rfl:     hydroxychloroquine (PLAQUENIL) 200 mg tablet, Take 1.5 tablets (300 mg total) by mouth daily. If unable to tolerate the smell of splitting tab - can take 200mg  on MWF, 400mg  other days of the week, Disp: 45 tablet, Rfl: 11    hydrOXYzine (ATARAX) 25 MG tablet, Take 1 tablet (25 mg total) by mouth every eight (8) hours as needed., Disp: 90 tablet, Rfl: 3    losartan (COZAAR) 50 MG tablet,  Take 1 tablet (50 mg total) by mouth daily., Disp: 90 tablet, Rfl: 3    melatonin 5 mg tablet, Take 1 tablet (5 mg total) by mouth nightly., Disp: , Rfl:     miscellaneous medical supply Misc, Pt needs a set of crutches., Disp: 1 each, Rfl: 0    mycophenolate (CELLCEPT) 500 mg tablet, Take 3 tablets (1,500 mg total) by mouth two (2) times a day., Disp: 540 tablet, Rfl: 0    norethindrone (MICRONOR) 0.35 mg tablet, Take 1 tablet by mouth daily., Disp: 84 tablet, Rfl: 3    omeprazole (PRILOSEC) 20 MG capsule, Take 1 capsule (20 mg total) by mouth daily., Disp: 90 capsule, Rfl: 3    oxyCODONE (ROXICODONE) 5 MG immediate release tablet, Take 1 tablet (5 mg total) by mouth daily as needed for pain., Disp: 30 tablet, Rfl: 0    predniSONE (DELTASONE) 1 MG tablet, Take 3 tablets (3 mg total) by mouth daily., Disp: 90 tablet, Rfl: 0    fluconazole (DIFLUCAN) 150 MG tablet, TAKE 1 TABLET (150 MG TOTAL) BY MOUTH ONCE FOR 1 DOSE. REPEAT IN 72HOURS IF NEEDED, Disp: , Rfl:     Medical History:  Past Medical History:   Diagnosis Date    Abnormal Pap smear of cervix     Allergic     Sulfa    Cataract     Deep vein thrombosis (CMS-HCC) May 24 2022    Dysplasia of cervix, 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    ECC pos CIN 1--12/19    Essential hypertension 09/23/2022    GERD (gastroesophageal reflux disease) Oct 23    HPV (human papilloma virus) infection     Lupus (CMS-HCC)     Lupus (CMS-HCC)     Rheumatoid arthritis (CMS-HCC) 11/18/2015    SLE (systemic lupus erythematosus related syndrome) (CMS-HCC)        Surgical History:  Past Surgical History:   Procedure Laterality Date    CERVICAL BIOPSY  W/ LOOP ELECTRODE EXCISION      COLPOSCOPY      SKIN BIOPSY  2013-2014       Social History:  Social History     Tobacco Use    Smoking status: Never    Smokeless tobacco: Never    Tobacco comments:     None   Vaping Use    Vaping status: Never Used   Substance Use Topics    Alcohol use: Never    Drug use: Never       Family History:  Family History   Problem Relation Age of Onset    Glaucoma Father     Diabetes Father     Hypertension Father     No Known Problems Mother     No Known Problems Sister     Diabetes Sister     Diabetes Sister     Melanoma Neg Hx     Basal cell carcinoma Neg Hx     Squamous cell carcinoma Neg Hx     Macular degeneration Neg Hx     Blindness Neg Hx        Objective   Vitals:    03/06/23 1255   BP: 104/75   Pulse: 89   Weight: 62 kg (136 lb 9.6 oz)   Height: 149.9 cm (4' 11)       Physical Exam:  Constitutional:   Patient is well appearing, and does not appear to be in any acute distress   Eyes:   PERRLA,  EOMI, and sclera aniteric. No scleral injection, no discharge, no allergic shiners.   ENT:   Moist, mucous membranes. Oropharhynx without any erythema or exudate. No sinus tenderness, nasal ulcerations, or oropharyngeal exudates or ulcerations.      Lymphatic  No cervical lymphadenopathy   Cardiovascular:  Normal rate, regular rhythm.  S1 and S2 normal, without any murmur, rub, or gallop. No lower extremity edema.  Warm and well perfused.    Respiratory:  Clear to auscultation bilaterally, without wheezes, crackles, or rhonchi. Good air movement. Normal work of breathing.   Skin: Retiform purpura on bilateral lower extremities. Livedo reticularis like lesions on anterior thighs, stable from previous visits.      Psychiatry:   Alert and oriented to person, place, and time. Appropriate affect. Mood and affect appropriate and congruent.   Musculo Skeletal:   Tender to R 1st IP joint w/o overt swelling. Able to make tight fist b/l. Good ROM of b/l wrists w/o swelling or tenderness.   Good ROM of b/l shoulders, tender to L.   Good ROM of b/l knees w/o swelling   Neurological:  Alert and oriented to person, place and time.            Test Results  Office Visit on 02/25/2023   Component Date Value    Color, UA 02/25/2023 Yellow     Clarity, UA 02/25/2023 Clear     Glucose, UA 02/25/2023 Negative     Bilirubin, UA 02/25/2023 Negative     Ketones, POC 02/25/2023 Negative     Spec Grav, UA 02/25/2023 >=1.030 (A)     Blood, UA 02/25/2023 Negative     pH, UA 02/25/2023 5.5     Protein, UA 02/25/2023 Trace (A)     Urobilinogen, UA 02/25/2023 0.2 E.U./dL     Leukocytes, UA 16/05/9603 Negative     Nitrite, UA 02/25/2023 Negative     STRIP LOT NUMBER 02/25/2023 .     STRIP LOT EXPIRATION 02/25/2023 .    Appointment on 02/09/2023   Component Date Value    Sodium 02/09/2023 143     Potassium 02/09/2023 3.4     Chloride 02/09/2023 110 (H)     CO2 02/09/2023 24.1     Anion Gap 02/09/2023 9     BUN 02/09/2023 9     Creatinine 02/09/2023 0.88     BUN/Creatinine Ratio 02/09/2023 10     eGFR CKD-EPI (2021) Fema* 02/09/2023 87     Glucose 02/09/2023 131     Calcium 02/09/2023 9.7     PTH 02/09/2023 44.1     Vitamin D Total (25OH) 02/09/2023 28.8     Color, UA 02/09/2023 Yellow     Clarity, UA 02/09/2023 Clear     Specific Gravity, UA 02/09/2023 >=1.030     pH, UA 02/09/2023 6.0     Leukocyte Esterase, UA 02/09/2023 Trace (A)     Nitrite, UA 02/09/2023 Negative     Protein, UA 02/09/2023 Negative     Glucose, UA 02/09/2023 Negative     Ketones, UA 02/09/2023 Negative     Urobilinogen, UA 02/09/2023 0.2 mg/dL Blood, UA 54/04/8118 Trace (A)     Bilirubin, UA 02/09/2023 Negative

## 2023-03-06 ENCOUNTER — Ambulatory Visit: Admit: 2023-03-06 | Discharge: 2023-03-07 | Payer: PRIVATE HEALTH INSURANCE

## 2023-03-06 ENCOUNTER — Ambulatory Visit: Admit: 2023-03-06 | Discharge: 2023-03-07 | Payer: PRIVATE HEALTH INSURANCE | Attending: Family | Primary: Family

## 2023-03-06 DIAGNOSIS — M3219 Other organ or system involvement in systemic lupus erythematosus: Principal | ICD-10-CM

## 2023-03-06 DIAGNOSIS — M328 Other forms of systemic lupus erythematosus: Principal | ICD-10-CM

## 2023-03-06 DIAGNOSIS — R7401 Transaminitis: Principal | ICD-10-CM

## 2023-03-06 LAB — URINALYSIS WITH MICROSCOPY WITH CULTURE REFLEX PERFORMABLE
BILIRUBIN UA: NEGATIVE
BLOOD UA: NEGATIVE
GLUCOSE UA: NEGATIVE
KETONES UA: NEGATIVE
LEUKOCYTE ESTERASE UA: NEGATIVE
NITRITE UA: NEGATIVE
PH UA: 6 (ref 5.0–9.0)
PROTEIN UA: NEGATIVE
RBC UA: <1 /HPF (ref 0–3)
SPECIFIC GRAVITY UA: >=1.03 (ref 1.005–1.030)
SQUAMOUS EPITHELIAL: 2 /HPF (ref 0–5)
UROBILINOGEN UA: 0.2
WBC UA: <1 /HPF (ref 0–3)

## 2023-03-06 LAB — CBC W/ AUTO DIFF
BASOPHILS ABSOLUTE COUNT: 0 10*9/L (ref 0.0–0.1)
BASOPHILS RELATIVE PERCENT: 0.6 %
EOSINOPHILS ABSOLUTE COUNT: 0 10*9/L (ref 0.0–0.5)
EOSINOPHILS RELATIVE PERCENT: 0.2 %
HEMATOCRIT: 39.3 % (ref 34.0–44.0)
HEMOGLOBIN: 12.3 g/dL (ref 11.3–14.9)
LYMPHOCYTES ABSOLUTE COUNT: 1 10*9/L — ABNORMAL LOW (ref 1.1–3.6)
LYMPHOCYTES RELATIVE PERCENT: 15.4 %
MEAN CORPUSCULAR HEMOGLOBIN CONC: 31.3 g/dL — ABNORMAL LOW (ref 32.0–36.0)
MEAN CORPUSCULAR HEMOGLOBIN: 26.9 pg (ref 25.9–32.4)
MEAN CORPUSCULAR VOLUME: 85.7 fL (ref 77.6–95.7)
MEAN PLATELET VOLUME: 7.7 fL (ref 6.8–10.7)
MONOCYTES ABSOLUTE COUNT: 0.5 10*9/L (ref 0.3–0.8)
MONOCYTES RELATIVE PERCENT: 7.4 %
NEUTROPHILS ABSOLUTE COUNT: 5.2 10*9/L (ref 1.8–7.8)
NEUTROPHILS RELATIVE PERCENT: 76.4 %
PLATELET COUNT: 245 10*9/L (ref 150–450)
RED BLOOD CELL COUNT: 4.59 10*12/L (ref 3.95–5.13)
RED CELL DISTRIBUTION WIDTH: 13 % (ref 12.2–15.2)
WBC ADJUSTED: 6.8 10*9/L (ref 3.6–11.2)

## 2023-03-06 LAB — C4 COMPLEMENT: C4 COMPLEMENT: 17.8 mg/dL (ref 12.0–36.0)

## 2023-03-06 LAB — PROTEIN / CREATININE RATIO, URINE
CREATININE, URINE: 146.1 mg/dL
PROTEIN URINE: 55 mg/dL
PROTEIN/CREAT RATIO, URINE: 0.376

## 2023-03-06 LAB — C3 COMPLEMENT: C3 COMPLEMENT: 119 mg/dL (ref 90–170)

## 2023-03-06 MED ORDER — PREDNISONE 1 MG TABLET
ORAL_TABLET | Freq: Every day | ORAL | 0 refills | 30 days | Status: CP
Start: 2023-03-06 — End: ?

## 2023-03-09 MED FILL — HYDROXYCHLOROQUINE 200 MG TABLET: ORAL | 30 days supply | Qty: 45 | Fill #7

## 2023-03-13 LAB — ANTI-DNA ANTIBODY, DOUBLE-STRANDED
DSDNA AB TITER: 1:20 {titer}
DSDNA ANTIBODY: POSITIVE — AB

## 2023-03-24 ENCOUNTER — Ambulatory Visit: Admit: 2023-03-24 | Discharge: 2023-03-25 | Payer: PRIVATE HEALTH INSURANCE

## 2023-03-24 LAB — CBC W/ AUTO DIFF
BASOPHILS ABSOLUTE COUNT: 0 10*9/L (ref 0.0–0.1)
BASOPHILS RELATIVE PERCENT: 0.6 %
EOSINOPHILS ABSOLUTE COUNT: 0 10*9/L (ref 0.0–0.5)
EOSINOPHILS RELATIVE PERCENT: 0.5 %
HEMATOCRIT: 37 % (ref 34.0–44.0)
HEMOGLOBIN: 11.9 g/dL (ref 11.3–14.9)
LYMPHOCYTES ABSOLUTE COUNT: 1.6 10*9/L (ref 1.1–3.6)
LYMPHOCYTES RELATIVE PERCENT: 24.7 %
MEAN CORPUSCULAR HEMOGLOBIN CONC: 32.2 g/dL (ref 32.0–36.0)
MEAN CORPUSCULAR HEMOGLOBIN: 27.2 pg (ref 25.9–32.4)
MEAN CORPUSCULAR VOLUME: 84.3 fL (ref 77.6–95.7)
MEAN PLATELET VOLUME: 8 fL (ref 6.8–10.7)
MONOCYTES ABSOLUTE COUNT: 0.7 10*9/L (ref 0.3–0.8)
MONOCYTES RELATIVE PERCENT: 10.6 %
NEUTROPHILS ABSOLUTE COUNT: 4.1 10*9/L (ref 1.8–7.8)
NEUTROPHILS RELATIVE PERCENT: 63.6 %
PLATELET COUNT: 248 10*9/L (ref 150–450)
RED BLOOD CELL COUNT: 4.39 10*12/L (ref 3.95–5.13)
RED CELL DISTRIBUTION WIDTH: 12.9 % (ref 12.2–15.2)
WBC ADJUSTED: 6.5 10*9/L (ref 3.6–11.2)

## 2023-03-24 LAB — PROTEIN / CREATININE RATIO, URINE
CREATININE, URINE: 102.1 mg/dL
PROTEIN URINE: 20.8 mg/dL
PROTEIN/CREAT RATIO, URINE: 0.204

## 2023-03-24 MED ADMIN — diphenhydrAMINE (BENADRYL) injection 25 mg: 25 mg | INTRAVENOUS | @ 13:00:00 | Stop: 2023-03-24

## 2023-03-24 MED ADMIN — acetaminophen (TYLENOL) tablet 650 mg: 650 mg | ORAL | @ 13:00:00 | Stop: 2023-03-24

## 2023-03-24 MED ADMIN — riTUXimab-abbs (TRUXIMA) 1,000 mg in sodium chloride (NS) 0.9 % 640 mL IVPB: 1000 mg | INTRAVENOUS | @ 14:00:00 | Stop: 2023-03-24

## 2023-03-24 MED ADMIN — methylPREDNISolone sodium succinate (PF) (SOLU-Medrol) injection 125 mg: 125 mg | INTRAVENOUS | @ 13:00:00 | Stop: 2023-03-24

## 2023-03-24 MED FILL — LOSARTAN 50 MG TABLET: ORAL | 90 days supply | Qty: 90 | Fill #2

## 2023-03-24 MED FILL — OMEPRAZOLE 20 MG CAPSULE,DELAYED RELEASE: ORAL | 90 days supply | Qty: 90 | Fill #2

## 2023-03-24 NOTE — Unmapped (Signed)
Patient presents for initial Truxima (rituximab-abbs) infusion.  In no acute distress.  Vitals stable.  Reports no new medical issues or S/S of infection.  IV started.  See MAR for premeds.      WBC                     6.5  Platelet               248  ANC                        4.1  Urine Protein         20.8        1015 Truxima (rituximab-abbs) 1000 mg infusing as follows:     50 mg/hr for 30 min   100 mg/hr for 30 min  150 mg/hr for 30 min  200 mg/hr for 30 min  250 mgl/hr for 30 min  300 mgl/hr for 30 min  350 mg/hr for 30 min  400 mg/hr for the rest of the infusion.    1445 Truxima (rituximab-abbs) infusion complete. PIV flushed with NS.  Vitals stable.   Patient observed for 30 minutes post infusion. Patient without any s/s of adverse reaction.    1515 IV d/c'd.  Patient discharged from Infusion Center.

## 2023-03-24 NOTE — Unmapped (Signed)
[facility] at [facility]  [address] [place], [place] [address]   Phone: [phone]    Date of Service: 03/25/2023    Patient Clinic Note    PCP: Referring Provider:   Loran Senters, FNP  [address] [place] [address]  Phone: [phone]  Fax: [phone] Farrel Conners, MD  [address] [place],  [place] [address]  Phone: [phone]  Fax: [phone]       Assessment and Plan:     Julia Bates is a 37 y.o. female with PMHX of SLE on immunosuppresion, history of pericarditis who presents for cardiac follow-up.     dyspnea on exertion  - Stable since our last visit.  Not consistent with past pericarditis, cardiac CT without coronary artery disease.  I think very unlikely to be restriction/constriction given reassuring echo findings and given only 1 episode of pericarditis in the past.  -Would not recommend further cardiac workup.    Pericarditis  - Resolved.  Occurred 02/2022 during lupus flare.  - Resolved with prednisone and colchicine.      Systemic lupus erythematous  - Follows with rheumatology for this.  Now on rituximab infusions.    Hypertension  - Will controlled on losartan.  Continue.    Hyperlipidemia  - Mild, given age and lack of other risk factors okay to hold on medical therapy for now.      Lab Results   Component Value Date    CHOL 206 (H) 03/18/2022     Lab Results   Component Value Date    HDL 60 03/18/2022     Lab Results   Component Value Date    LDL 111 (H) 03/18/2022     Lab Results   Component Value Date    VLDL 35 (H) 03/18/2022     Lab Results   Component Value Date    CHOLHDLRATIO 3.4 03/18/2022     Lab Results   Component Value Date    TRIG 175 (H) 03/18/2022       The ASCVD Risk score (Arnett DK, et al., 2019) failed to calculate for the following reasons:    The 2019 ASCVD risk score is only valid for ages 46 to 37    Note: For patients with SBP <90 or >200, Total Cholesterol <130 or >320, HDL <20 or >100 which are outside of the allowable range, the calculator will use these upper or lower values to calculate the patient???s risk score.      Lab Results   Component Value Date    A1C 5.3 08/13/2010         Return in about 1 year (around 03/24/2024).          Subjective:     Chief Complaint:  Chief Complaint   Patient presents with    Routine Follow-up         Referring Provider: Farrel Conners, MD    History of Present Illness:     Julia Bates is a 37 y.o. female, with a history of lupus and acute pericarditis 02/2022 who is here for follow-up of pericarditis.     Patient states from a pericarditis perspective she has been feeling well.  Still endorsing some dyspnea with exertion, largely unchanged from our past visit.  No chest pain, no significant syncope or presyncope.      -----Presenting HPI-----------  Patient recently admitted to [facility] for acute chest pain, diagnosed with pericarditis and  placed on increased immunotherapy with rheumatology input.  She is now on 0.5 mg of prednisone daily, done with her colchicine.  She notes 1 out of 10 chronic chest discomfort which is improved since presentation to the hospital but about the same as when she was discharged.  She also notes some shortness of breath with exertion most notably when she is going upstairs as she works for [employer] occasionally.  She states this is new and was not an issue prior to this pericarditis episode.    Denies syncope, presyncope, significant chest discomfort.    Cardiovascular History & Procedures:  Cardiovascular Problems:  Pericarditis  Lupus    Cath / PCI:  None    CV Surgery:  None    EP Procedures and Devices:  None    Non-Invasive Evaluation(s): independently reviewed the most recent study.   Echocardiogram (03/2022) -normal LVEF, normal RV function and size, no pericardial effusion  Cardiac CT 10/21/2022-no coronary artery disease    Medical History:  Past Medical History:   Diagnosis Date    Abnormal Pap smear of cervix     Allergic     Sulfa    Cataract Deep vein thrombosis (CMS-HCC) May 24 2022    Dysplasia of cervix, 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    ECC pos CIN 1--12/19    Essential hypertension 09/23/2022    GERD (gastroesophageal reflux disease) Oct 23    HPV (human papilloma virus) infection     Lupus (CMS-HCC)     Lupus (CMS-HCC)     Rheumatoid arthritis (CMS-HCC) 11/18/2015    SLE (systemic lupus erythematosus related syndrome) (CMS-HCC)        Surgical History:  Past Surgical History:   Procedure Laterality Date    CERVICAL BIOPSY  W/ LOOP ELECTRODE EXCISION      COLPOSCOPY      SKIN BIOPSY  2013-2014       Social History:   reports that she has never smoked. She has never used smokeless tobacco. She reports that she does not drink alcohol and does not use drugs.    Family History:  family history includes Diabetes in her father, sister, and sister; Glaucoma in her father; Hypertension in her father; No Known Problems in her mother and sister.    Review of Systems:   Except as noted in the HPI, the remainder of 10 systems reviewed is negative.     Allergies:  Allergies   Allergen Reactions    Sulfa (Sulfonamide Antibiotics) Nausea And Vomiting and Other (See Comments)     Other reaction(s): FEVER       Medications:   Prior to Admission medications    Medication Dose, Route, Frequency   calcium carbonate-vitamin D3 600 mg(1,500mg ) -200 unit per tablet 1 tablet, Oral, 2 times a day   ergocalciferol, vitamin D2, (VITAMIN D2 ORAL) 1,000 Units, Oral, Daily   hydroxychloroquine (PLAQUENIL) 200 mg tablet 300 mg, Oral, Daily (standard), If unable to tolerate the smell of splitting tab - can take 200mg  on MWF, 400mg  other days of the week   hydrOXYzine (ATARAX) 25 MG tablet 25 mg, Oral, Every 8 hours PRN   losartan (COZAAR) 50 MG tablet 50 mg, Oral, Daily (standard)   melatonin 5 mg tablet 5 mg, Oral, Nightly   mycophenolate (CELLCEPT) 500 mg tablet 1,500 mg, Oral, 2 times a day (standard)   norethindrone (MICRONOR) 0.35 mg tablet 1 tablet, Oral, Daily (standard) omeprazole (PRILOSEC) 20 MG capsule 20 mg, Oral, Daily (standard)  oxyCODONE (ROXICODONE) 5 MG immediate release tablet 5 mg, Oral, Daily PRN   predniSONE (DELTASONE) 1 MG tablet 3 mg, Oral, Daily (standard)   tizanidine (ZANAFLEX) 2 MG tablet 2 mg, Oral, Every 8 hours PRN        Objective:     Vitals  BP 108/66 (BP Site: L Arm, BP Position: Sitting, BP Cuff Size: Medium)  - Pulse 72  - Wt 63.7 kg (140 lb 6.4 oz)  - SpO2 98%  - BMI 28.36 kg/m??      Wt Readings from Last 3 Encounters:   03/25/23 63.7 kg (140 lb 6.4 oz)   03/24/23 63 kg (139 lb)   03/06/23 62 kg (136 lb 9.6 oz)       Physical Exam  General:  Pleasant female sitting in chair in nad.   Neck: Supple, JVP normal.   Resp:   CTAB bilaterally with normal WOB.   Cardio:  RRR without m/r/g.   Abdomen:   Soft, non-distended, non-tender.   Extremities: Warm well-perfused bilaterally. No edema bilaterally.   MSK: No joint swelling or erythema. No gross deformities.   Skin: No rashes   Neuro: CN II-XII grossly intact. Strength grossly intact.    Psych: Alert and oriented x3. Appropriate mood.      ECG (03/25/23) - independently interpreted.  None today.    Most recent ECG May 21, 2022-independently reviewed-sinus tachycardia, normal axis, low voltage precordial leads      Most Recent Labs   Lab Results   Component Value Date    NA 143 02/09/2023    K 3.4 02/09/2023    CL 110 (H) 02/09/2023    CO2 24.1 02/09/2023     Lab Results   Component Value Date    BUN 9 02/09/2023    BUN 11 10/17/2022    BUN 6 (L) 08/15/2014    BUN 8 08/14/2014     Lab Results   Component Value Date    Creatinine Whole Blood, POC 0.9 10/21/2022    Creatinine/CP 0.51 (L) 08/27/2010    Creatinine 0.88 02/09/2023    Creatinine 0.95 01/29/2023    Creatinine 0.73 11/16/2014    Creatinine 0.56 (L) 09/21/2014     No results found for: PROBNP  Lab Results   Component Value Date    Cholesterol 206 (H) 03/18/2022    Triglycerides 175 (H) 03/18/2022    HDL 60 03/18/2022    Non-HDL Cholesterol 146 (H) 03/18/2022    LDL Calculated 111 (H) 03/18/2022

## 2023-03-25 ENCOUNTER — Ambulatory Visit
Admit: 2023-03-25 | Discharge: 2023-03-26 | Payer: PRIVATE HEALTH INSURANCE | Attending: Student in an Organized Health Care Education/Training Program | Primary: Student in an Organized Health Care Education/Training Program

## 2023-03-25 DIAGNOSIS — E7849 Other hyperlipidemia: Principal | ICD-10-CM

## 2023-03-25 DIAGNOSIS — M3212 Pericarditis in systemic lupus erythematosus: Principal | ICD-10-CM

## 2023-03-25 DIAGNOSIS — I10 Essential (primary) hypertension: Principal | ICD-10-CM

## 2023-03-25 DIAGNOSIS — M329 Systemic lupus erythematosus, unspecified: Principal | ICD-10-CM

## 2023-03-25 DIAGNOSIS — I319 Disease of pericardium, unspecified: Principal | ICD-10-CM

## 2023-03-25 DIAGNOSIS — R0609 Other forms of dyspnea: Principal | ICD-10-CM

## 2023-03-25 NOTE — Unmapped (Signed)
Thank you for coming to see us today at [facility] [place]. Please call us at [phone] if you have any questions.

## 2023-03-29 DIAGNOSIS — G8929 Other chronic pain: Principal | ICD-10-CM

## 2023-03-29 MED ORDER — OXYCODONE 5 MG TABLET
ORAL_TABLET | Freq: Every day | ORAL | 0 refills | 30 days | PRN
Start: 2023-03-29 — End: ?

## 2023-03-30 NOTE — Unmapped (Signed)
Oxycodone refill  Last ov: 03/06/2023  Next ov: 06/02/2023

## 2023-03-31 MED ORDER — OXYCODONE 5 MG TABLET
ORAL_TABLET | Freq: Every day | ORAL | 0 refills | 30 days | Status: CP | PRN
Start: 2023-03-31 — End: ?

## 2023-03-31 MED FILL — NORETHINDRONE (CONTRACEPTIVE) 0.35 MG TABLET: ORAL | 84 days supply | Qty: 84 | Fill #1

## 2023-04-07 ENCOUNTER — Ambulatory Visit: Admit: 2023-04-07 | Discharge: 2023-04-07 | Payer: PRIVATE HEALTH INSURANCE

## 2023-04-07 DIAGNOSIS — M328 Other forms of systemic lupus erythematosus: Principal | ICD-10-CM

## 2023-04-07 DIAGNOSIS — R7401 Transaminitis: Principal | ICD-10-CM

## 2023-04-07 LAB — CBC W/ AUTO DIFF
BASOPHILS ABSOLUTE COUNT: 0 10*9/L (ref 0.0–0.1)
BASOPHILS RELATIVE PERCENT: 0.3 %
EOSINOPHILS ABSOLUTE COUNT: 0 10*9/L (ref 0.0–0.5)
EOSINOPHILS RELATIVE PERCENT: 0.4 %
HEMATOCRIT: 39 % (ref 34.0–44.0)
HEMOGLOBIN: 12.7 g/dL (ref 11.3–14.9)
LYMPHOCYTES ABSOLUTE COUNT: 0.8 10*9/L — ABNORMAL LOW (ref 1.1–3.6)
LYMPHOCYTES RELATIVE PERCENT: 12 %
MEAN CORPUSCULAR HEMOGLOBIN CONC: 32.6 g/dL (ref 32.0–36.0)
MEAN CORPUSCULAR HEMOGLOBIN: 27.3 pg (ref 25.9–32.4)
MEAN CORPUSCULAR VOLUME: 83.7 fL (ref 77.6–95.7)
MEAN PLATELET VOLUME: 7.9 fL (ref 6.8–10.7)
MONOCYTES ABSOLUTE COUNT: 0.6 10*9/L (ref 0.3–0.8)
MONOCYTES RELATIVE PERCENT: 8.3 %
NEUTROPHILS ABSOLUTE COUNT: 5.5 10*9/L (ref 1.8–7.8)
NEUTROPHILS RELATIVE PERCENT: 79 %
PLATELET COUNT: 224 10*9/L (ref 150–450)
RED BLOOD CELL COUNT: 4.66 10*12/L (ref 3.95–5.13)
RED CELL DISTRIBUTION WIDTH: 13.2 % (ref 12.2–15.2)
WBC ADJUSTED: 6.9 10*9/L (ref 3.6–11.2)

## 2023-04-07 LAB — PROTEIN / CREATININE RATIO, URINE
CREATININE, URINE: 232.4 mg/dL
PROTEIN URINE: 58.6 mg/dL
PROTEIN/CREAT RATIO, URINE: 0.252

## 2023-04-07 MED ADMIN — diphenhydrAMINE (BENADRYL) injection 25 mg: 25 mg | INTRAVENOUS | @ 14:00:00 | Stop: 2023-04-07

## 2023-04-07 MED ADMIN — acetaminophen (TYLENOL) tablet 650 mg: 650 mg | ORAL | @ 14:00:00 | Stop: 2023-04-07

## 2023-04-07 MED ADMIN — riTUXimab-abbs (TRUXIMA) 1,000 mg in sodium chloride (NS) 0.9 % 640 mL IVPB: 1000 mg | INTRAVENOUS | @ 14:00:00 | Stop: 2023-04-07

## 2023-04-07 MED ADMIN — methylPREDNISolone sodium succinate (PF) (SOLU-Medrol) injection 125 mg: 125 mg | INTRAVENOUS | @ 14:00:00 | Stop: 2023-04-07

## 2023-04-07 NOTE — Unmapped (Addendum)
Patient presents for subsequent  Truxima (rituximab-abbs) infusion. In no acute distress.  Vitals stable.  Reports no new medical issues or S/S of infection.  PIV started in R hand.  See MAR for premeds.    WBC 6.9  Plts 224    1017 Truxima (rituximab-abbs) 1000 mg started to infuse as follows:     100 mg /hr for 30 min  200 mg/hr for 30 min  300 mg/hr for 30 min  400 mg/hr for the rest of the infusion.    1332 Truxima (rituximab-abbs) infusion complete. PIV flushed with NS.  Vitals stable.  Patient without any s/s of adverse reaction.    1344 IV d/c'd.  Patient discharged from Infusion Center in stable condition.

## 2023-04-08 MED FILL — HYDROXYCHLOROQUINE 200 MG TABLET: ORAL | 30 days supply | Qty: 45 | Fill #8

## 2023-04-09 ENCOUNTER — Ambulatory Visit: Admit: 2023-04-09 | Discharge: 2023-04-09 | Payer: PRIVATE HEALTH INSURANCE

## 2023-04-11 DIAGNOSIS — M3219 Other organ or system involvement in systemic lupus erythematosus: Principal | ICD-10-CM

## 2023-04-11 MED ORDER — PREDNISONE 1 MG TABLET
ORAL_TABLET | Freq: Every day | ORAL | 0 refills | 30 days
Start: 2023-04-11 — End: ?

## 2023-04-14 MED ORDER — PREDNISONE 1 MG TABLET
ORAL_TABLET | Freq: Every day | ORAL | 3 refills | 30 days | Status: CP
Start: 2023-04-14 — End: ?

## 2023-04-14 NOTE — Unmapped (Signed)
Duplicate request. Other request pended.

## 2023-04-14 NOTE — Unmapped (Signed)
Prednisone refill  Last ov: 03/06/2023  Next ov: 04/11/2023

## 2023-04-15 MED ORDER — PREDNISONE 1 MG TABLET
ORAL_TABLET | Freq: Every day | ORAL | 0 refills | 30 days
Start: 2023-04-15 — End: ?

## 2023-04-23 DIAGNOSIS — M3219 Other organ or system involvement in systemic lupus erythematosus: Principal | ICD-10-CM

## 2023-04-23 MED ORDER — MYCOPHENOLATE MOFETIL 500 MG TABLET
ORAL_TABLET | Freq: Two times a day (BID) | ORAL | 0 refills | 90 days | Status: CP
Start: 2023-04-23 — End: 2024-04-22
  Filled 2023-05-06: qty 180, 30d supply, fill #0

## 2023-04-23 NOTE — Unmapped (Signed)
Mycophenolate refill  Last Visit Date: 03/06/2023  Next Visit Date: 06/02/2023    Lab Results   Component Value Date    ALT 29 01/29/2023    AST 34 01/29/2023    ALBUMIN 3.8 10/17/2022    CREATININE 0.88 02/09/2023     Lab Results   Component Value Date    WBC 6.9 04/07/2023    HGB 12.7 04/07/2023    HCT 39.0 04/07/2023    PLT 224 04/07/2023     Lab Results   Component Value Date    NEUTROPCT 79.0 04/07/2023    LYMPHOPCT 12.0 04/07/2023    MONOPCT 8.3 04/07/2023    EOSPCT 0.4 04/07/2023    BASOPCT 0.3 04/07/2023

## 2023-04-27 MED FILL — HYDROXYZINE HCL 25 MG TABLET: ORAL | 30 days supply | Qty: 90 | Fill #3

## 2023-04-27 NOTE — Unmapped (Signed)
[facility] Refill Coordination Note    Julia Bates, [place]: 1985/12/08  Phone: [phone] (home)       All above HIPAA information was verified with patient.         04/25/2023    12:47 AM   Specialty Rx Medication Refill Questionnaire   Which Medications would you like refilled and shipped? Hydroxychloroquine 200 mg 1 week left   Please list all current allergies: Sulfa   Have you missed any doses in the last 30 days? No   Have you had any changes to your medication(s) since your last refill? No   How many days remaining of each medication do you have at home? Varies   Have you experienced any side effects in the last 30 days? No   Please enter the full address (street address, city, state, zip code) where you would like your medication(s) to be delivered to. [address] , [place] [address]   Please specify on which day you would like your medication(s) to arrive. Note: if you need your medication(s) within 3 days, please call the pharmacy to schedule your order at [phone]  04/29/2023   Has your insurance changed since your last refill? No   Would you like a pharmacist to call you to discuss your medication(s)? No   Do you require a signature for your package? (Note: if we are billing Medicare Part B or your order contains a controlled substance, we will require a signature) No   Additional Comments: Can you call first before billing. Thank you         Completed refill call assessment today to schedule patient's medication shipment from the [facility] [phone]).  All relevant notes have been reviewed.       Confirmed patient received a Conservation officer, historic buildings and a Surveyor, mining with first shipment. The patient will receive a drug information handout for each medication shipped and additional FDA Medication Guides as required.         REFERRAL TO PHARMACIST     Referral to the pharmacist: Not needed      [facility]     Shipping address confirmed in Epic.     Delivery Scheduled: Yes, Expected medication delivery date: 04/30/23.     Medication will be delivered via Next Day Courier to the prescription address in Epic WAM.    [name]   [facility] Specialty Technician

## 2023-04-29 DIAGNOSIS — G8929 Other chronic pain: Principal | ICD-10-CM

## 2023-04-29 MED ORDER — OXYCODONE 5 MG TABLET
ORAL_TABLET | Freq: Every day | ORAL | 0 refills | 30 days | PRN
Start: 2023-04-29 — End: ?

## 2023-04-29 MED ORDER — HYDROXYCHLOROQUINE 200 MG TABLET
ORAL_TABLET | Freq: Every day | ORAL | 3 refills | 90 days | Status: CP
Start: 2023-04-29 — End: 2024-04-28
  Filled 2023-05-06: qty 135, 90d supply, fill #0

## 2023-04-29 NOTE — Unmapped (Signed)
Julia Bates 's entire shipment will be rescheduled as a result of the medication is too soon to refill until 04/30/2023.     I have spoken with the patient  at [phone]  and communicated the delivery change. We will reschedule the medication for the delivery date that the patient agreed upon.  We have confirmed the delivery date as 05/01/2023, via ups.

## 2023-04-29 NOTE — Unmapped (Signed)
HCQ 90 day supply   Last Visit Date: 03/06/2023  Next Visit Date: 06/02/2023

## 2023-04-30 MED ORDER — OXYCODONE 5 MG TABLET
ORAL_TABLET | Freq: Every day | ORAL | 0 refills | 30 days | Status: CP | PRN
Start: 2023-04-30 — End: ?

## 2023-04-30 NOTE — Unmapped (Signed)
Oxycodone refill  Last ov: 03/06/2023  Next ov: 06/02/2023

## 2023-05-11 MED ORDER — TIZANIDINE 2 MG TABLET
ORAL_TABLET | Freq: Three times a day (TID) | ORAL | 0 refills | 30 days | Status: CP | PRN
Start: 2023-05-11 — End: 2023-09-08

## 2023-05-11 NOTE — Unmapped (Signed)
Tizanidine refill 9 different pharmacy)  Last ov: 03/06/2023  Next ov: 06/02/2023

## 2023-05-22 ENCOUNTER — Ambulatory Visit: Admit: 2023-05-22 | Payer: PRIVATE HEALTH INSURANCE

## 2023-05-22 NOTE — Unmapped (Signed)
[facility] [place]  OUTPATIENT PHYSICAL THERAPY  05/22/2023  Note Type: Evaluation       Patient Name: Julia Bates  Date of Birth:[birth year]  Diagnosis:   Encounter Diagnoses   Name Primary?    Chronic bilateral low back pain without sciatica     Myofascial pain syndrome of lumbar spine     Myofascial pain syndrome, cervical     Myofascial pain syndrome of thoracic spine     Other systemic lupus erythematosus with pericarditis (CMS-HCC)     Physical deconditioning Yes     Referring Provider: Reesa Chew B    Date of Onset of Impairment: 05/21/2018  Date PT Care Plan Established or Reviewed: 05/22/2023  Date PT Treatment Started: 05/22/2023     Plan of Care Effective Date: pending auth  (05/22/23 - 08/22/23)  Session Number:  1    ASSESSMENT & PLAN   Assessment  Assessment details:    Haylen is a pleasant 37 y.o. female who presents for Physical Therapy Evaluation with diffuse chronic pain throughout cervical, thoracic, and lumbar spine in the setting of SLE and RA. Clinical presentation today is consistent with myofascial pain syndrome in the regions listed above. Will benefit from lumbopelvic stability training, upper back strengthening, and gentle myofascial release. We also discussed a trial of aquatic PT to address pain symptoms. The patient will benefit from skilled Physical Therapy intervention to address the mild impairments listed below and to assist the patient in maximizing her functional independence and safe return to prior level of function.     Aquatic Physical Therapy Attestation:   Anu E. Binkley will benefit from aquatic therapy given clinical presentation of: pain limiting functional movement or exercise tolerance, strength limiting functional movement or exercise, limited tolerance to weightbearing, and motor control deficits    During initial Physical Therapy Evaluation the patient was screened and identified the following precautions:   No relative contraindications identified The following conditions require medical provider clearance prior to aquatic therapy:   No conditions identified     The following conditions were not reported or observed: nausea/vomiting, fever greater than 100 deg, diarrhea/bowel incontinence in the last two weeks, viral/bacterial/fungal disease. The patient verbalizes understanding that the session will be cancelled if any of the above conditions develop. YES    Aquatic Therapy Education:  - Discussed POC: bridge to land therapy  - Advised patient to wear shoes and bring assistive device if applicable while ambulating on pool deck   - Advised patient to bring water and snacks as appropriate   - Advised patient only members of the [facility] are permitted to use amenities such as the hot tub and sauna  - Land re-evaluation scheduled within 30 days of initial pool session by evaluating therapist: yes  - Anticipated method of entry:  8 stairs Independently  - Focus for aquatic therapist: Bridge to land therapy, Pain reduction, LE Strengthening, UE Strengthening, and Core Strengthening          Impairments: decreased endurance, pain, decreased strength, decreased range of motion and impaired motor control      Personal Factors/Comorbidities: 3+    Specific Comorbidities: SLE, osteopenia, chronic pain    Examination of Body Systems: musculoskeletal, activity/participation and communication    Clinical Presentation: stable    Clinical Decision Making: low    Prognosis: good prognosis    Positive Prognosis Rationale: age and motivated for treatment.  Negative Prognosis Rationale: medical status/condition, chronicity of condition and severity of  symptoms.      Therapy Goals      Goals:        1. In 12 weeks the patient will demonstrate independent performance of HEP to maintain functional gains.   2. In 12 weeks the patient will demonstrate >75% of lumbar flexion AROM to indicate progression towards return to PLOF.   3. In 12 weeks the patient will demonstrate a walking tolerance of 5 minutes with the LRAD to indicate improved activity tolerance.   4. In 12 weeks the patient will complete an additional 3 STS transfers during the 30s chair stand test to met the MDC and to demonstrate increased functional leg strength.        Plan    Therapy options: will be seen for skilled physical therapy services    Planned therapy interventions: Balance Training, Education - Patient, Endurance Activites, Functional Mobility, Home Exercise Program, Education - Family/Caregiver, E-Stim, [employer], Civil engineer, contracting, Diaphragmatic/Pursed-lip Breathing, 97113-Aquatic Therapy, 97112-Neuromuscular Re-education, 97110-Therapeutic Exercises, 97116-Gait Training, 97140-Manual Therapy, 97530-Therapeutic Activities, 97535-Self-Care/Home Training, 97750-Physical Performance Test and 16109, 20561-Dry Needling 1-2, 3+ areas    DME Equipment: [employer].    Frequency: one time every other week.    Duration in weeks: 12    Education provided to: patient.    Education provided: HEP, Treatment options and plan, Symptom management, Safety education, Importance of Therapy, Anatomy, Body mechanics, Role of therapy in Rehabilitation, Posture, Community resources and Body awareness    Education results: verbalized good understanding, demonstrates understanding and needs reinforcement.    Communication/Consultation: N/A.              SUBJECTIVE         History of Present Condition     History of Present Condition/Chief Complaint:  Myofascial pain syndrome of lumbar spine [M79.18]  - Primary    Chronic bilateral low back pain without sciatica [M54.50, G89.29]    Myofascial pain syndrome, cervical [M79.18]  Myofascial pain syndrome of thoracic spine [M79.18]  Comments: Diagnosis: Chronic cervical, thoracic, and lumbar spine pain. Suspect myofascial component. History of SLE and RA.    For low back: Evaluate kinetic chain, address muscle imbalances. Emphasis on strengthening of core and lumbopelvic stabilizers.  For neck/upper back: Address posture. Focus on strengthening of scapular retractors and deep neck flexors. Chin tucks, low rows. Stretching of pectoralis muscles and upper trapezius. Discuss improvements to ergonomics.    Soft tissue mobilization and myofascial release PRN    Subjective:  I have lupus. Bones and joints hurt in the morning worse, the back pain gets worse at night. Sharp pain in upper back which gets so bad I can't breathe. They do give me pain meds. Hot packs help. Standing for long periods of time, sitting and walking for even short distance (<300 ft)  makes the pain worse. Walk around the mall for 2 miles. I run out of breath quickly.   Pain:     Current pain rating:  7    At best pain rating:  7    At worst pain rating:  10  Location:  Diffuse lumbar, thoracic, and cervical pain    Quality:  Sharp and aching    Relieving factors:  Heat    Aggravating factors:  On the move, rising from sitting, walking and standing    Pain related Behaviors:  None    Progression:  No change    Red Flags:  None  Precautions/Equipment   Precautions:  Osteopenia and Other    Current  Braces/Orthoses:  None    Equipment Currently Used:  Single point cane (sometimes)  Prior Functional Status     Physical limitation(s):  Unable to work >1 year due to physical deconditioning limitations secondary to lupus with pericarditis hospitialization  Current Functional Status:    unsteady gait, limited walking tolerance, limited standing tolerance, limited lifting, limited exercise and limited household activities  Social Support:     Communication Preference:  Verbal, written and visual  Barriers to Learning:  No Barriers    Work/School:  Unable to work for at least one year  Treatments:     Previous treatment:  Physical therapy  Patient Goals:     Patient/Family goals for therapy:  Decreased pain, increased strength, increased ROM and improved ambulation    Other patient goals:  Walking more than 2 miles.      OBJECTIVE     Range of Motion - Lumbar Spine       No ROM Loss  (0%) Min ROM Loss  (1-25%) Mod ROM Loss  (26-65%) Major ROM Loss  (66-100%)    Symptoms    Flexion     x  Increased pain left lower back    Extension    x    Increased pain left lower back    Side Bend Right    x    Increased pain left lower back    Side Bend Left    x   Increased pain left lower back    Sideglide Right  x      Increased pain left lower back    Sideglide Left  x      Increased pain left lower back        Thoracic AROM   Flexion: WNL and no change in pain   Extension: WNL and no change in pain   Rotation L:  WNL and no change in pain   Rotation R:  WNL and no change in pain     Cervical AROM   Flexion: 50 deg with Left UT pain   Extension: 40 deg with Left UT pain   SB R: 30 deg with left UT pain (most painful)   SB L: 30 deg with Left UT pain   Rotation R: 60 deg with left UT pain  (2nd most painful)   Rotation L: 60 deg  with left UT pain      TTP: Left upper trap, left lower lumbar paraspinals into fascia and upper posterior hip     Gait: decreased lumbar rotation, decreased gait speed, 100 feet, increased pain in legs     Sensation: reports occasional BLE numbness     DLTT: 45 deg    30s STS: 5 reps (with increased diffuse joint pain)   DNF Endurance Test: 14s with increased pain L UT     TREATMENT RENDERED     Therapeutic exercise: 15 Minutes   Performed with direct PT demonstration, instruction, supervision, and guidance.   - Education on condition, prognosis, and PT POC  - HEP review as below     Exercises  - Supine Chin Tuck  - 1 x daily - 2 sets - 5 reps - 3 sec hold  - Seated Posterior Pelvic Tilt  - 1 x daily - 2 sets - 10 reps - 1-2s hold      Next Visit Plan: gentle STM UT,  pool education     core and lumbopelvic stabilizers.  For neck/upper back: Address posture. Focus on strengthening  of scapular retractors and deep neck flexors. Chin tucks, low rows. Stretching of pectoralis muscles and upper trapezius. Total Treatment Time: 45 Minutes  PT Evaluation Charges  $$ PT Evaluation - Low Complexity [mins]-97001: 30     Therapeutic Interventions Charges  $$ Therapeutic Exercise [mins]: 15                 I attest that I have reviewed the above information.  Signed: Barron Alvine, PT, DPT  05/22/2023 1:20 PM        I reviewed the no-show/attendance policy with the patient and caregiver(s). The patient is aware that they must call to cancel appointments more than 24 hours in advance. They are also aware that if they late cancel or no-show three times, we reserve the right to cancel their remaining appointments. This policy is in place to allow [place] to best serve the needs of our caseload.    If patient returns to clinic with variance in plan of care, then it may be attributable to one or more of the following factors: preferred clinician availability, appointment time request availability, therapy pool appointment availability, major holiday with clinic closure, caregiver availability, patient transportation, conflicting medical appointment, inclement weather, and/or patient illness.    If patient does not return for follow up visit(s) related to this episode of care, this note will serve as their discharge note from Physical Therapy.

## 2023-05-26 NOTE — Unmapped (Unsigned)
Assessment/Plan:   Julia Bates is a 37 y.o. female with history of SLE who returns today for follow up.     1. SLE: Characterized by rash, arthralgias, hypocomplementemia, pericarditis, remote history of nephritis. Today continues to have joint symptoms and rash. Scheduled to receive Rituxan 03/2023.   - Continue Cellcept 1500mg  BID for now. Will check with Julia Bates about if should decreased once she starts Rituxan.  - Continue prednisone 3mg  daily for now. Decrease to 2mg  daily after second Rituxan infusion.    - Will receive Rituxan 1000mg  x2 03/2023.   - Continue Plaquenil 200 mg once daily. Last eye exam 12/2022.  - Continue to follow up with Cardiology for prior episode of pericarditis.   - Recent monitoring labs from 02/09/23 independently reviewed and overall stable. Update CBCw/diff, dsDNA, C3/C4, UPC, UA    2. Chronic lower back pain:   - Continue tizanidine 2 mg nightly.  - Continue oxycodone 5 mg once daily.  Pt reports inability to taper further.   - She reports plan to establish with local pain management and will discuss taking over oxycodone with them.   - UDS completed 10/21/22  - Continue F/U with Ortho, last visit 11/04/22.     3. H/o osteopenia: Follows with Endocrinology  - Continue daily Vitamin D and Calcium.    4. Fatty liver disease - Previous abdominal US suggestive of fatty liver disease. Did not discuss today. Patient should otherwise continue to limit alcohol, increase exercise, and eat a healthy low fat diet.    -Continue to follow up with PCP.    F/U as scheduled in October with Julia Bates    I personally spent *** minutes face-to-face and non-face-to-face in the care of this patient, which includes all pre, intra, and post visit time on the date of service.  All documented time was specific to the E/M visit and does not include any procedures that may have been performed.    History of Present Illness:     Primary Care Provider: Loran Senters, FNP    Chief Complaint: Follow up SLE    HPI:  Julia Bates is a 37 y.o.  female with a h/o SLE who presents for a follow up visit. Patient has a h/o prior nephritis in the distant past; also has rash and joint pain. She was previously on  Benlysta which was stopped given it is unclear if it was helping her. In 2016, patient was in a clinical trial of an anti-CD28 inhibitor and had to be withdrawn from the study due to prolonged healing from cellulitis. She has been treated with cellcept in the past, but this was discontinued in 12/2016 due to pt desire to conceive. Started on imuran in 12/2016. Was unable to go above 50 mg imuran qd due to worsening leukopenia and no longer taking.  She continues on Plaquenil 200mg  daily (this was changed from twice daily by her ophthalmologist in the past). She also has a hx of chronic lower back pain, for which she has been on oxycodone.     Hospitalized 05/2022 due to progressive, painful, tender rash on medial left lower thigh and lateral left upper thigh. 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 Dermatology and rheumatology were consulted and she was started on prednisone taper and Cellcept. Also started on Eliquis to address the thrombotic element of her presentation.  Saphnelo discontinued 05/2022 due to continued disease activity.     09/2022 pt switched back to cellcept from Myfortic due to GERD.     Last Visit: 03/06/23 with Julia Bailey, FNP      Interim History:   Since last visit on 03/06/23 with Julia Bailey, FNP, patient has had no ED visits or hospitalizations. She has followed with PT. Last Truxima infusion 03/24/23.      She is taking cellcept 1500mg  BID and tolerating though still has some nausea. She is on prednisone 3mg  daily. She is taking HCQ and had stable eye exam 12/2022. She continues to have joint pain. She reports stiffness especially to fingers. AM stiffness will last a couple hours. She reports some swelling to ankles in evening but resolved by morning. She is scheduled to have Rituxan in August. She continues to have lacy rash to legs and reports this as stable. This is no longer itchy. No recent oral ulcers. She recently developed flank pain which she saw her PCP about recently. Her most recent urine showed trace protein but no blood. She plans to have [place] of kidneys. No recent fever. She is taking oxycodone one tablet daily and hasn't been able to tolerate lower dose. She was referred to pain clinic and waiting to hear about scheduling.     Balance of 10 systems was reviewed and is negative except for that mentioned in the HPI.    Review of records: I have reviewed labs/images/clinic notes per the computerized medical record.     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  One recent report suggests that the sensitivity of IgA detection by DIF is cases of IgA vasculitis is 81% using the EULAR criteria as the gold standard for diagnosis.  The specificity of DIF for the diagnosis of IgA vasculitis is 83%.  Therefore, it is important to use clinical criteria in making the diagnosis as false negatives and false positive results can occur.  This study is consists of a predominantly adult inpatient population.     Skin biopsy 05/21/2022:  Left leg, punch  Diagnosis:  - Intravascular fibrin thrombi with sparse to moderately dense perivascular mixed inflammation with neutrophils and eosinophils and microhemorrhage. See comment.   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  A mixed pattern such as this can be seen in the context of connective tissue disease, levamisole, cryoglobulinemia, drug/medication.  Clinical correlation is recommended.    Allergies:  Sulfa (sulfonamide antibiotics)    Medications:     Current Outpatient Medications:     calcium carbonate-vitamin D3 600 mg(1,500mg ) -200 unit per tablet, Take 1 tablet by mouth Two (2) times a day., Disp: , Rfl:     ergocalciferol, vitamin D2, (VITAMIN D2 ORAL), Take 1,000 Units by mouth daily with evening meal., Disp: , Rfl:     hydroxychloroquine (PLAQUENIL) 200 mg tablet, Take 1.5 tablets (300 mg total) by mouth daily. If unable to tolerate the smell of splitting tab - can take 200mg  on MWF, 400mg  other days of the week, Disp: 135 tablet, Rfl: 3    hydrOXYzine (ATARAX) 25 MG tablet, Take 1 tablet (25  mg total) by mouth every eight (8) hours as needed., Disp: 90 tablet, Rfl: 3    losartan (COZAAR) 50 MG tablet, Take 1 tablet (50 mg total) by mouth daily., Disp: 90 tablet, Rfl: 3    melatonin 5 mg tablet, Take 1 tablet (5 mg total) by mouth nightly., Disp: , Rfl:     mycophenolate (CELLCEPT) 500 mg tablet, Take 3 tablets (1,500 mg total) by mouth two (2) times a day., Disp: 540 tablet, Rfl: 0    norethindrone (MICRONOR) 0.35 mg tablet, Take 1 tablet by mouth daily., Disp: 84 tablet, Rfl: 3    omeprazole (PRILOSEC) 20 MG capsule, Take 1 capsule (20 mg total) by mouth daily., Disp: 90 capsule, Rfl: 3    oxyCODONE (ROXICODONE) 5 MG immediate release tablet, Take 1 tablet (5 mg total) by mouth daily as needed for pain., Disp: 30 tablet, Rfl: 0    predniSONE (DELTASONE) 1 MG tablet, TAKE 3 TABLETS BY MOUTH DAILY, Disp: 90 tablet, Rfl: 3    tizanidine (ZANAFLEX) 2 MG tablet, Take 1 tablet (2 mg total) by mouth every eight (8) hours as needed., Disp: 90 tablet, Rfl: 0    Medical History:  Past Medical History:   Diagnosis Date    Abnormal Pap smear of cervix     Allergic     Sulfa    Cataract     Deep vein thrombosis (CMS-HCC) May 24 2022    Dysplasia of cervix, 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    ECC pos CIN 1--12/19    Essential hypertension 09/23/2022    GERD (gastroesophageal reflux disease) Oct 23    HPV (human papilloma virus) infection     Lupus (CMS-HCC)     Lupus (CMS-HCC)     Rheumatoid arthritis (CMS-HCC) 11/18/2015    SLE (systemic lupus erythematosus related syndrome) (CMS-HCC)        Surgical History:  Past Surgical History:   Procedure Laterality Date    CERVICAL BIOPSY  W/ LOOP ELECTRODE EXCISION      COLPOSCOPY      SKIN BIOPSY  2013-2014       Social History:  Social History     Tobacco Use    Smoking status: Never    Smokeless tobacco: Never    Tobacco comments:     None   Vaping Use    Vaping status: Never Used   Substance Use Topics    Alcohol use: Never    Drug use: Never       Family History:  Family History   Problem Relation Age of Onset    Glaucoma Father     Diabetes Father     Hypertension Father     No Known Problems Mother     No Known Problems Sister     Diabetes Sister     Diabetes Sister     Melanoma Neg Hx     Basal cell carcinoma Neg Hx     Squamous cell carcinoma Neg Hx     Macular degeneration Neg Hx     Blindness Neg Hx        Objective   There were no vitals filed for this visit.      Physical Exam:  Constitutional:   Patient is well appearing, and does not appear to be in any acute distress   Eyes:   PERRLA, EOMI, and sclera aniteric. No scleral injection, no discharge, no allergic shiners.   ENT:   Moist, mucous membranes. Oropharhynx without any  erythema or exudate. No sinus tenderness, nasal ulcerations, or oropharyngeal exudates or ulcerations.      Lymphatic  No cervical lymphadenopathy   Cardiovascular:  Normal rate, regular rhythm.  S1 and S2 normal, without any murmur, rub, or gallop. No lower extremity edema.  Warm and well perfused.    Respiratory:  Clear to auscultation bilaterally, without wheezes, crackles, or rhonchi. Good air movement. Normal work of breathing.   Skin:    Retiform purpura on bilateral lower extremities. Livedo reticularis like lesions on anterior thighs, stable from previous visits.      Psychiatry:   Alert and oriented to person, place, and time. Appropriate affect. Mood and affect appropriate and congruent.   Musculo Skeletal:   Tender to R 1st IP joint w/o overt swelling. Able to make tight fist b/l. Good ROM of b/l wrists w/o swelling or tenderness.   Good ROM of b/l shoulders, tender to L.   Good ROM of b/l knees w/o swelling   Neurological:  Alert and oriented to person, place and time.            Test Results  No visits with results within 4 Week(s) from this visit.   Latest known visit with results is:   Infusion on 04/07/2023   Component Date Value    WBC 04/07/2023 6.9     RBC 04/07/2023 4.66     HGB 04/07/2023 12.7     HCT 04/07/2023 39.0     MCV 04/07/2023 83.7     MCH 04/07/2023 27.3     MCHC 04/07/2023 32.6     RDW 04/07/2023 13.2     MPV 04/07/2023 7.9     Platelet 04/07/2023 224     Neutrophils % 04/07/2023 79.0     Lymphocytes % 04/07/2023 12.0     Monocytes % 04/07/2023 8.3     Eosinophils % 04/07/2023 0.4     Basophils % 04/07/2023 0.3     Absolute Neutrophils 04/07/2023 5.5     Absolute Lymphocytes 04/07/2023 0.8 (L)     Absolute Monocytes 04/07/2023 0.6     Absolute Eosinophils 04/07/2023 0.0     Absolute Basophils 04/07/2023 0.0     Creat U 04/07/2023 232.4     Protein, Ur 04/07/2023 58.6     Protein/Creatinine Ratio* 04/07/2023 0.252      ***ATTEST

## 2023-05-27 DIAGNOSIS — G8929 Other chronic pain: Principal | ICD-10-CM

## 2023-05-27 NOTE — Unmapped (Signed)
Oxycodone refill  Last ov: 03/06/2023  Next ov: 06/02/2023

## 2023-05-29 MED ORDER — OXYCODONE 5 MG TABLET
ORAL_TABLET | Freq: Every day | ORAL | 0 refills | 30 days | Status: CP | PRN
Start: 2023-05-29 — End: ?

## 2023-06-02 ENCOUNTER — Ambulatory Visit: Admit: 2023-06-02 | Discharge: 2023-06-03 | Payer: PRIVATE HEALTH INSURANCE

## 2023-06-02 ENCOUNTER — Ambulatory Visit
Admit: 2023-06-02 | Discharge: 2023-06-03 | Payer: PRIVATE HEALTH INSURANCE | Attending: Internal Medicine | Primary: Internal Medicine

## 2023-06-02 DIAGNOSIS — R52 Pain, unspecified: Principal | ICD-10-CM

## 2023-06-02 DIAGNOSIS — G8929 Other chronic pain: Principal | ICD-10-CM

## 2023-06-02 DIAGNOSIS — M3219 Other organ or system involvement in systemic lupus erythematosus: Principal | ICD-10-CM

## 2023-06-02 DIAGNOSIS — M545 Chronic bilateral low back pain without sciatica: Principal | ICD-10-CM

## 2023-06-02 LAB — CBC W/ AUTO DIFF
BASOPHILS ABSOLUTE COUNT: 0 10*9/L (ref 0.0–0.1)
BASOPHILS RELATIVE PERCENT: 0.6 %
EOSINOPHILS ABSOLUTE COUNT: 0 10*9/L (ref 0.0–0.5)
EOSINOPHILS RELATIVE PERCENT: 0.3 %
HEMATOCRIT: 38.9 % (ref 34.0–44.0)
HEMOGLOBIN: 12.7 g/dL (ref 11.3–14.9)
LYMPHOCYTES ABSOLUTE COUNT: 0.6 10*9/L — ABNORMAL LOW (ref 1.1–3.6)
LYMPHOCYTES RELATIVE PERCENT: 19 %
MEAN CORPUSCULAR HEMOGLOBIN CONC: 32.6 g/dL (ref 32.0–36.0)
MEAN CORPUSCULAR HEMOGLOBIN: 27.4 pg (ref 25.9–32.4)
MEAN CORPUSCULAR VOLUME: 84 fL (ref 77.6–95.7)
MEAN PLATELET VOLUME: 8.1 fL (ref 6.8–10.7)
MONOCYTES ABSOLUTE COUNT: 0.6 10*9/L (ref 0.3–0.8)
MONOCYTES RELATIVE PERCENT: 18.5 %
NEUTROPHILS ABSOLUTE COUNT: 1.9 10*9/L (ref 1.8–7.8)
NEUTROPHILS RELATIVE PERCENT: 61.6 %
PLATELET COUNT: 225 10*9/L (ref 150–450)
RED BLOOD CELL COUNT: 4.63 10*12/L (ref 3.95–5.13)
RED CELL DISTRIBUTION WIDTH: 13.3 % (ref 12.2–15.2)
WBC ADJUSTED: 3.1 10*9/L — ABNORMAL LOW (ref 3.6–11.2)

## 2023-06-02 LAB — PROTEIN / CREATININE RATIO, URINE
CREATININE, URINE: 163.1 mg/dL
PROTEIN URINE: 40.2 mg/dL
PROTEIN/CREAT RATIO, URINE: 0.246

## 2023-06-02 LAB — URINALYSIS WITH MICROSCOPY WITH CULTURE REFLEX PERFORMABLE
BILIRUBIN UA: NEGATIVE
BLOOD UA: NEGATIVE
GLUCOSE UA: NEGATIVE
KETONES UA: NEGATIVE
LEUKOCYTE ESTERASE UA: NEGATIVE
NITRITE UA: NEGATIVE
PH UA: 6 (ref 5.0–9.0)
RBC UA: 1 /HPF (ref <=4)
SPECIFIC GRAVITY UA: 1.02 (ref 1.003–1.030)
SQUAMOUS EPITHELIAL: 2 /HPF (ref 0–5)
UROBILINOGEN UA: <2
WBC UA: 1 /HPF (ref 0–5)

## 2023-06-02 LAB — COMPREHENSIVE METABOLIC PANEL
ALBUMIN: 4 g/dL (ref 3.4–5.0)
ALKALINE PHOSPHATASE: 112 U/L (ref 46–116)
ALT (SGPT): 23 U/L (ref 10–49)
ANION GAP: 8 mmol/L (ref 5–14)
AST (SGOT): 25 U/L (ref <=34)
BILIRUBIN TOTAL: 0.5 mg/dL (ref 0.3–1.2)
BLOOD UREA NITROGEN: 10 mg/dL (ref 9–23)
BUN / CREAT RATIO: 11
CALCIUM: 9.4 mg/dL (ref 8.7–10.4)
CHLORIDE: 111 mmol/L — ABNORMAL HIGH (ref 98–107)
CO2: 26.1 mmol/L (ref 20.0–31.0)
CREATININE: 0.89 mg/dL
EGFR CKD-EPI (2021) FEMALE: 86 mL/min/{1.73_m2} (ref >=60)
GLUCOSE RANDOM: 92 mg/dL (ref 70–99)
POTASSIUM: 3.6 mmol/L (ref 3.4–4.8)
PROTEIN TOTAL: 6.6 g/dL (ref 5.7–8.2)
SODIUM: 145 mmol/L (ref 135–145)

## 2023-06-02 LAB — C4 COMPLEMENT: C4 COMPLEMENT: 18.8 mg/dL (ref 12.0–36.0)

## 2023-06-02 LAB — C3 COMPLEMENT: C3 COMPLEMENT: 123 mg/dL (ref 90–170)

## 2023-06-02 MED ORDER — HYDROXYCHLOROQUINE 200 MG TABLET
ORAL_TABLET | Freq: Every day | ORAL | 3 refills | 90 days | Status: CP
Start: 2023-06-02 — End: 2024-06-01

## 2023-06-02 MED ORDER — PREDNISONE 1 MG TABLET
ORAL_TABLET | Freq: Every day | ORAL | 3 refills | 30 days | Status: CP
Start: 2023-06-02 — End: ?

## 2023-06-02 MED ORDER — MYCOPHENOLATE MOFETIL 500 MG TABLET
ORAL_TABLET | Freq: Two times a day (BID) | ORAL | 0 refills | 135 days | Status: CP
Start: 2023-06-02 — End: 2024-06-01

## 2023-06-02 MED ORDER — DICLOFENAC 1 % TOPICAL GEL
Freq: Four times a day (QID) | TOPICAL | 0 refills | 13 days
Start: 2023-06-02 — End: ?

## 2023-06-02 MED ADMIN — ketorolac (TORADOL) injection 30 mg: 30 mg | INTRAMUSCULAR | @ 16:00:00 | Stop: 2023-06-02

## 2023-06-02 NOTE — Unmapped (Unsigned)
Assessment/Plan:   Julia Bates is a 37 y.o. female with history of SLE who returns today for follow up.     1. SLE: Characterized by rash, arthralgias, hypocomplementemia, pericarditis, remote history of nephritis. Today her rash has improved, stable continued joint pain. As time passes I suspect rituximab's effect on joint pain will become more evident and can further taper prednisone.  - Continue Cellcept 1000mg  BID   - Continue prednisone 3mg  daily for now  - s/p Rituxan 1000mg  03/24/2023 and 04/07/2023. Due 09/2023  - Continue Plaquenil 300 mg once daily. Last eye exam 12/2022.  - Continue to follow up with Cardiology for prior episode of pericarditis.   - Update CBCw/diff, dsDNA, C3/C4, UPC, UA    2. Chronic lower back pain:   - Continue tizanidine 2 mg nightly.  - Continue oxycodone 5 mg once daily.  Pt reports inability to taper further.   - She reports plan to establish with local pain management and will discuss taking over oxycodone with them.   - UDS completed 10/21/22  - Continue F/U with Ortho, last visit 11/04/22.     3. H/o osteopenia: Follows with Endocrinology  - Continue daily Vitamin D and Calcium.    4. Right lower chest pain:  Tender at anterior and dorsal rib cage on exam A>D, exacerbated by reaching over her head, no cutaneous changes, no abdominal pain or tenderness. Concern for MSK pain as happened while reaching into the washer at home- given osteopenia, will eval for fracture.  - CXR today without rib fracture  - voltaren gel to right chest as needed  - Toradol shot today in clinic    5. Health maintenance  She would benefit from Flu/COVID vaccination, this should be done 2-4 weeks before her next Rituxin dose ~January.  - she does not currently want flu or COVID shot    F/U Delorise Shiner in 3 months and Dr. Marland Mcalpine in 8 months    History of Present Illness:     Primary Care Provider: Loran Senters, FNP    Chief Complaint: Follow up SLE    HPI:  Julia Bates is a 37 y.o.  female with a h/o SLE who presents for a follow up visit. Patient has a h/o prior nephritis in the distant past; also has rash and joint pain. She was previously on  Benlysta which was stopped given it is unclear if it was helping her. In 2016, patient was in a clinical trial of an anti-CD28 inhibitor and had to be withdrawn from the study due to prolonged healing from cellulitis. She has been treated with cellcept in the past, but this was discontinued in 12/2016 due to pt desire to conceive. Started on imuran in 12/2016. Was unable to go above 50 mg imuran qd due to worsening leukopenia and no longer taking. She also has a hx of chronic lower back pain, for which she has been on oxycodone.     Hospitalized 05/2022 due to progressive, painful, tender rash on medial left lower thigh and lateral left upper thigh. 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 Dermatology and rheumatology were consulted and she was started on prednisone taper and Cellcept. Also started on Eliquis to address the thrombotic element of her presentation.     Saphnelo discontinued 05/2022 due to continued disease activity.     09/2022 pt switched back to cellcept from Myfortic  due to GERD.     Interim history:    She will wake up in the morning with pain in her knees, hands and elbows. Sometimes the joint pain will last all day, but sometimes gets better fairly quickly. She has morning stiffness for about 5-10 minutes. This is stable from before.    She has whole body shakes in the morning as well. She is awake during these episodes and they spontaneously resolve. The hands will also tremor when holding things throughout the day. That has been happening for about a week. She is having no fevers, weight loss or significant night sweats.    She has had no significant rashes recently, she will have some transient (~1 day) redness on her cheeks when she goes out in the sun.     She has DOE with short distances ambulating in the store, she can't take stairs. This has been stable since she her 2023 admission. She saw cardiology and they felt this was non-cardiogenic after work-up.     She is on 3mg  daily of prednisone, mycophenolate 2 pills twice a day, plaquenil 1.5 pills daily and Rituximab.    No chest pain, nausea, vomiting, stomach pain, diarrhea, lightheadedness, or dizziness.     Review of records: I have reviewed labs/images/clinic notes per the computerized medical record.     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  One recent report suggests that the sensitivity of IgA detection by DIF is cases of IgA vasculitis is 81% using the EULAR criteria as the gold standard for diagnosis.  The specificity of DIF for the diagnosis of IgA vasculitis is 83%.  Therefore, it is important to use clinical criteria in making the diagnosis as false negatives and false positive results can occur.  This study is consists of a predominantly adult inpatient population.     Skin biopsy 05/21/2022:  Left leg, punch  Diagnosis:  - Intravascular fibrin thrombi with sparse to moderately dense perivascular mixed inflammation with neutrophils and eosinophils and microhemorrhage. See comment.   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  A mixed pattern such as this can be seen in the context of connective tissue disease, levamisole, cryoglobulinemia, drug/medication.  Clinical correlation is recommended.    Allergies:  Sulfa (sulfonamide antibiotics)    Medications:     Current Outpatient Medications: calcium carbonate-vitamin D3 600 mg(1,500mg ) -200 unit per tablet, Take 1 tablet by mouth Two (2) times a day., Disp: , Rfl:     ergocalciferol, vitamin D2, (VITAMIN D2 ORAL), Take 1,000 Units by mouth daily with evening meal., Disp: , Rfl:     hydroxychloroquine (PLAQUENIL) 200 mg tablet, Take 1.5 tablets (300 mg total) by mouth daily. If unable to tolerate the smell of splitting tab - can take 200mg  on MWF, 400mg  other days of the week, Disp: 135 tablet, Rfl: 3    hydrOXYzine (ATARAX) 25 MG tablet, Take 1 tablet (25 mg total) by mouth every eight (8) hours as needed., Disp: 90 tablet, Rfl: 3    losartan (COZAAR) 50 MG tablet, Take 1 tablet (50 mg total) by mouth daily., Disp: 90 tablet, Rfl:  3    melatonin 5 mg tablet, Take 1 tablet (5 mg total) by mouth nightly., Disp: , Rfl:     mycophenolate (CELLCEPT) 500 mg tablet, Take 3 tablets (1,500 mg total) by mouth two (2) times a day., Disp: 540 tablet, Rfl: 0    norethindrone (MICRONOR) 0.35 mg tablet, Take 1 tablet by mouth daily., Disp: 84 tablet, Rfl: 3    omeprazole (PRILOSEC) 20 MG capsule, Take 1 capsule (20 mg total) by mouth daily., Disp: 90 capsule, Rfl: 3    oxyCODONE (ROXICODONE) 5 MG immediate release tablet, Take 1 tablet (5 mg total) by mouth daily as needed for pain., Disp: 30 tablet, Rfl: 0    predniSONE (DELTASONE) 1 MG tablet, TAKE 3 TABLETS BY MOUTH DAILY, Disp: 90 tablet, Rfl: 3    tizanidine (ZANAFLEX) 2 MG tablet, Take 1 tablet (2 mg total) by mouth every eight (8) hours as needed., Disp: 90 tablet, Rfl: 0    Medical History:  Past Medical History:   Diagnosis Date    Abnormal Pap smear of cervix     Allergic     Sulfa    Cataract     Deep vein thrombosis (CMS-HCC) May 24 2022    Dysplasia of cervix, 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    ECC pos CIN 1--12/19    Essential hypertension 09/23/2022    GERD (gastroesophageal reflux disease) Oct 23    HPV (human papilloma virus) infection     Lupus     Lupus     Rheumatoid arthritis (CMS-HCC) 11/18/2015 SLE (systemic lupus erythematosus related syndrome) (CMS-HCC)        Surgical History:  Past Surgical History:   Procedure Laterality Date    CERVICAL BIOPSY  W/ LOOP ELECTRODE EXCISION      COLPOSCOPY      SKIN BIOPSY  2013-2014       Social History:  Social History     Tobacco Use    Smoking status: Never    Smokeless tobacco: Never    Tobacco comments:     None   Vaping Use    Vaping status: Never Used   Substance Use Topics    Alcohol use: Never    Drug use: Never       Family History:  Family History   Problem Relation Age of Onset    Glaucoma Father     Diabetes Father     Hypertension Father     No Known Problems Mother     No Known Problems Sister     Diabetes Sister     Diabetes Sister     Melanoma Neg Hx     Basal cell carcinoma Neg Hx     Squamous cell carcinoma Neg Hx     Macular degeneration Neg Hx     Blindness Neg Hx        Objective   Vitals:    06/02/23 1048   BP: 139/99   Pulse: 79   Temp: 36.3 ??C (97.3 ??F)   TempSrc: Temporal   Weight: 64.1 kg (141 lb 6.4 oz)   Height: 149.9 cm (4' 11)       Physical Exam:  Constitutional:   Patient is well appearing, and does not appear to be in any acute distress   Eyes:   PERRLA, EOMI, and sclera aniteric. No scleral injection, no discharge, no allergic shiners.   ENT:   Moist, mucous membranes. Oropharhynx without any erythema or exudate. No sinus tenderness, nasal  ulcerations, or oropharyngeal exudates or ulcerations.      Lymphatic  No cervical lymphadenopathy   Cardiovascular:  Normal rate, regular rhythm.  S1 and S2 normal, without any murmur, rub, or gallop. No lower extremity edema.  Warm and well perfused.    Respiratory:  Clear to auscultation bilaterally, without wheezes, crackles, or rhonchi. Good air movement. Normal work of breathing.   Skin:    No rashes.     Psychiatry:   Alert and oriented to person, place, and time. Appropriate affect. Mood and affect appropriate and congruent.   Musculo Skeletal:   No tender or swollen joints in the hands. Able to make tight fist b/l. Good ROM of b/l wrists w/o swelling or tenderness.   Good ROM of b/l shoulders   Good ROM of b/l knees w/o swelling  TTP anterior and posterior inferior rib margin, exacerbated with raising arms over her head.   Neurological:  Alert and oriented to person, place and time.            Test Results  No visits with results within 4 Week(s) from this visit.   Latest known visit with results is:   Infusion on 04/07/2023   Component Date Value    WBC 04/07/2023 6.9     RBC 04/07/2023 4.66     HGB 04/07/2023 12.7     HCT 04/07/2023 39.0     MCV 04/07/2023 83.7     MCH 04/07/2023 27.3     MCHC 04/07/2023 32.6     RDW 04/07/2023 13.2     MPV 04/07/2023 7.9     Platelet 04/07/2023 224     Neutrophils % 04/07/2023 79.0     Lymphocytes % 04/07/2023 12.0     Monocytes % 04/07/2023 8.3     Eosinophils % 04/07/2023 0.4     Basophils % 04/07/2023 0.3     Absolute Neutrophils 04/07/2023 5.5     Absolute Lymphocytes 04/07/2023 0.8 (L)     Absolute Monocytes 04/07/2023 0.6     Absolute Eosinophils 04/07/2023 0.0     Absolute Basophils 04/07/2023 0.0     Creat U 04/07/2023 232.4     Protein, Ur 04/07/2023 58.6     Protein/Creatinine Ratio* 04/07/2023 0.252 Lymphocytes % 04/07/2023 12.0     Monocytes % 04/07/2023 8.3     Eosinophils % 04/07/2023 0.4     Basophils % 04/07/2023 0.3     Absolute Neutrophils 04/07/2023 5.5     Absolute Lymphocytes 04/07/2023 0.8 (L)     Absolute Monocytes 04/07/2023 0.6     Absolute Eosinophils 04/07/2023 0.0     Absolute Basophils 04/07/2023 0.0     Creat U 04/07/2023 232.4     Protein, Ur 04/07/2023 58.6     Protein/Creatinine Ratio* 04/07/2023 0.252

## 2023-06-02 NOTE — Unmapped (Signed)
Voltaren  Last Visit Date: 06/02/2023  Next Visit Date: 09/02/2023

## 2023-06-03 DIAGNOSIS — M3219 Other organ or system involvement in systemic lupus erythematosus: Principal | ICD-10-CM

## 2023-06-03 MED ORDER — MYCOPHENOLATE MOFETIL 500 MG TABLET
ORAL_TABLET | Freq: Two times a day (BID) | ORAL | 1 refills | 90 days
Start: 2023-06-03 — End: ?

## 2023-06-03 MED ORDER — DICLOFENAC 1 % TOPICAL GEL
Freq: Four times a day (QID) | TOPICAL | 0 refills | 13 days | Status: CP
Start: 2023-06-03 — End: ?

## 2023-06-03 NOTE — Unmapped (Signed)
[facility] Refill Coordination Note    Specialty Medication(s) to be Shipped:   Inflammatory Disorders: mycophenolate    Other medication(s) to be shipped: No additional medications requested for fill at this time     Julia Bates, DOB: 06/18/1986  Phone: [phone] (home)       All above HIPAA information was verified with patient.     Was a Nurse, learning disability used for this call? No    Completed refill call assessment today to schedule patient's medication shipment from the [facility]  [phone]).  All relevant notes have been reviewed.     Specialty medication(s) and dose(s) confirmed: Patient reports changes to the regimen as follows: 2 tabs twice a day    Changes to medications: Julia Bates reports no changes at this time.  Changes to insurance: No  New side effects reported not previously addressed with a pharmacist or physician: None reported  Questions for the pharmacist: No    Confirmed patient received a Conservation officer, historic buildings and a Surveyor, mining with first shipment. The patient will receive a drug information handout for each medication shipped and additional FDA Medication Guides as required.       DISEASE/MEDICATION-SPECIFIC INFORMATION        N/A    SPECIALTY MEDICATION ADHERENCE     Medication Adherence    Patient reported X missed doses in the last month: 0  Specialty Medication: mycophenolate 500 mg tablet (CELLCEPT)  Patient is on additional specialty medications: No  Informant: patient              Were doses missed due to medication being on hold? No     mycophenolate 500 mg tablet (CELLCEPT): 7 days of medicine on hand     REFERRAL TO PHARMACIST     Referral to the pharmacist: Not needed      [place]     Shipping address confirmed in Epic.       Delivery Scheduled: Yes, Expected medication delivery date: 06/09/23.  However, Rx request for refills was sent to the provider as there are none remaining.     Medication will be delivered via Next Day Courier to the prescription address in Epic WAM.    Julia Bates   [facility]  Specialty Technician

## 2023-06-03 NOTE — Unmapped (Signed)
Cellcept refill  Last Visit Date: 06/02/2023  Next Visit Date: 09/02/2023    Lab Results   Component Value Date    ALT 23 06/02/2023    AST 25 06/02/2023    ALBUMIN 4.0 06/02/2023    CREATININE 0.89 06/02/2023     Lab Results   Component Value Date    WBC 3.1 (L) 06/02/2023    HGB 12.7 06/02/2023    HCT 38.9 06/02/2023    PLT 225 06/02/2023     Lab Results   Component Value Date    NEUTROPCT 61.6 06/02/2023    LYMPHOPCT 19.0 06/02/2023    MONOPCT 18.5 06/02/2023    EOSPCT 0.3 06/02/2023    BASOPCT 0.6 06/02/2023

## 2023-06-04 MED ORDER — MYCOPHENOLATE MOFETIL 500 MG TABLET
ORAL_TABLET | Freq: Two times a day (BID) | ORAL | 1 refills | 135 days | Status: CP
Start: 2023-06-04 — End: ?
  Filled 2023-06-08: qty 120, 30d supply, fill #0

## 2023-06-05 DIAGNOSIS — M545 Chronic bilateral low back pain without sciatica: Principal | ICD-10-CM

## 2023-06-05 DIAGNOSIS — G8929 Other chronic pain: Principal | ICD-10-CM

## 2023-06-05 LAB — ANTI-DNA ANTIBODY, DOUBLE-STRANDED: DSDNA ANTIBODY: NEGATIVE

## 2023-06-05 MED ORDER — DICLOFENAC 1 % TOPICAL GEL
Freq: Four times a day (QID) | TOPICAL | 5 refills | 13 days | Status: CP
Start: 2023-06-05 — End: ?

## 2023-06-05 NOTE — Unmapped (Signed)
[facility] Pharmacist has reviewed a new prescription for mycophenolate that indicates a dose decrease.  Patient was counseled on this dosage change by Dr. Katrinka Blazing- see epic note from 10/22.  Next refill call date adjusted if necessary.

## 2023-06-05 NOTE — Unmapped (Signed)
Clinical Assessment Needed For: Dose Change  Medication: Mycophenolate 500mg  tablet  Last Fill Date/Day Supply: 04/30/2023 / 30 days  Copay $4  Was previous dose already scheduled to fill: Yes    Notes to Pharmacist: Scheduled to fill 10/28

## 2023-06-07 DIAGNOSIS — M7918 Myalgia, other site: Principal | ICD-10-CM

## 2023-06-07 MED ORDER — TIZANIDINE 2 MG TABLET
ORAL_TABLET | Freq: Three times a day (TID) | ORAL | 1 refills | 0 days | PRN
Start: 2023-06-07 — End: ?

## 2023-06-08 MED ORDER — TIZANIDINE 2 MG TABLET
ORAL_TABLET | Freq: Three times a day (TID) | ORAL | 1 refills | 90 days | Status: CP | PRN
Start: 2023-06-08 — End: ?

## 2023-06-08 NOTE — Unmapped (Signed)
Tizanidine refill  Last Visit Date: 06/02/2023  Next Visit Date: 09/02/2023    Lab Results   Component Value Date    ALT 23 06/02/2023    AST 25 06/02/2023    ALBUMIN 4.0 06/02/2023    CREATININE 0.89 06/02/2023     Lab Results   Component Value Date    WBC 3.1 (L) 06/02/2023    HGB 12.7 06/02/2023    HCT 38.9 06/02/2023    PLT 225 06/02/2023     Lab Results   Component Value Date    NEUTROPCT 61.6 06/02/2023    LYMPHOPCT 19.0 06/02/2023    MONOPCT 18.5 06/02/2023    EOSPCT 0.3 06/02/2023    BASOPCT 0.6 06/02/2023

## 2023-06-10 NOTE — Unmapped (Signed)
[facility] [place]  OUTPATIENT PHYSICAL THERAPY  06/10/2023          Patient Name: Julia Bates  Date of Birth:01-Jun-1986  Diagnosis:   Encounter Diagnoses   Name Primary?    Physical deconditioning Yes    Other systemic lupus erythematosus with pericarditis (CMS-HCC)     Myofascial pain syndrome of thoracic spine     Myofascial pain syndrome, cervical     Myofascial pain syndrome of lumbar spine     Chronic bilateral low back pain without sciatica        Referring Provider: Reesa Chew B    Date of Onset of Impairment: 05/21/2018  Date PT Care Plan Established or Reviewed: 05/22/2023  Date PT Treatment Started: 05/22/2023     Plan of Care Effective Date: pending auth  (05/22/23 - 08/22/23)  Session Number:  2    ASSESSMENT & PLAN   Assessment  Assessment details:    Julia Bates presents for PT follow up with continued diffuse chronic pain throughout cervical, thoracic, and lumbar spine in the setting of SLE and RA. Reports acute onset of right sided flank pain that is only exacerbated by active R shoulder flexion, does not change with trunk motions or laying down. Will trial aquatic exercise next session. Exercise intensity and duration adjusted to appropriately challenge and progress functional strength, endurance and/or range of motion. Julia Bates will continue to benefit from skilled Physical Therapy intervention to address the mild impairments listed below and to maximize her functional independence and safety.       Eval: Julia Bates is a pleasant 37 y.o. female who presents for Physical Therapy Evaluation with diffuse chronic pain throughout cervical, thoracic, and lumbar spine in the setting of SLE and RA. Clinical presentation today is consistent with myofascial pain syndrome in the regions listed above. Will benefit from lumbopelvic stability training, upper back strengthening, and gentle myofascial release. We also discussed a trial of aquatic PT to address pain symptoms. The patient will benefit from skilled Physical Therapy intervention to address the mild impairments listed below and to assist the patient in maximizing her functional independence and safe return to prior level of function.     Aquatic Physical Therapy Attestation:   Julia Bates will benefit from aquatic therapy given clinical presentation of: pain limiting functional movement or exercise tolerance, strength limiting functional movement or exercise, limited tolerance to weightbearing, and motor control deficits    During initial Physical Therapy Evaluation the patient was screened and identified the following precautions:   No relative contraindications identified     The following conditions require medical provider clearance prior to aquatic therapy:   No conditions identified     The following conditions were not reported or observed: nausea/vomiting, fever greater than 100 deg, diarrhea/bowel incontinence in the last two weeks, viral/bacterial/fungal disease. The patient verbalizes understanding that the session will be cancelled if any of the above conditions develop. YES    Aquatic Therapy Education:  - Discussed POC: bridge to land therapy  - Advised patient to wear shoes and bring assistive device if applicable while ambulating on pool deck   - Advised patient to bring water and snacks as appropriate   - Advised patient only members of the [facility] are permitted to use amenities such as the hot tub and sauna  - Land re-evaluation scheduled within 30 days of initial pool session by evaluating therapist: yes  - Anticipated method of entry:  8 stairs Independently  -  Focus for aquatic therapist: Bridge to land therapy, Pain reduction, LE Strengthening, UE Strengthening, and Core Strengthening          Impairments: decreased endurance, pain, decreased strength, decreased range of motion and impaired motor control      Personal Factors/Comorbidities: 3+    Specific Comorbidities: SLE, osteopenia, chronic pain    Examination of Body Systems: musculoskeletal, activity/participation and communication    Clinical Presentation: stable    Clinical Decision Making: low    Prognosis: good prognosis    Positive Prognosis Rationale: age and motivated for treatment.  Negative Prognosis Rationale: medical status/condition, chronicity of condition and severity of symptoms.      Therapy Goals      Goals:        1. In 12 weeks the patient will demonstrate independent performance of HEP to maintain functional gains.   2. In 12 weeks the patient will demonstrate >75% of lumbar flexion AROM to indicate progression towards return to PLOF.   3. In 12 weeks the patient will demonstrate a walking tolerance of 5 minutes with the LRAD to indicate improved activity tolerance.   4. In 12 weeks the patient will complete an additional 3 STS transfers during the 30s chair stand test to met the MDC and to demonstrate increased functional leg strength.        Plan    Therapy options: will be seen for skilled physical therapy services    Planned therapy interventions: Balance Training, Education - Patient, Endurance Activites, Functional Mobility, Home Exercise Program, Education - Family/Caregiver, E-Stim, [employer], Civil engineer, contracting, Diaphragmatic/Pursed-lip Breathing, 97113-Aquatic Therapy, 97112-Neuromuscular Re-education, 97110-Therapeutic Exercises, 97116-Gait Training, 97140-Manual Therapy, 97530-Therapeutic Activities, 97535-Self-Care/Home Training, 97750-Physical Performance Test and 16109, 20561-Dry Needling 1-2, 3+ areas    DME Equipment: [employer].    Frequency: one time every other week.    Duration in weeks: 12    Education provided to: patient.    Education provided: HEP, Treatment options and plan, Symptom management, Safety education, Importance of Therapy, Anatomy, Body mechanics, Role of therapy in Rehabilitation, Posture, Community resources and Body awareness    Education results: verbalized good understanding, demonstrates understanding and needs reinforcement.    Communication/Consultation: N/A.              SUBJECTIVE         History of Present Condition     History of Present Condition/Chief Complaint:  Myofascial pain syndrome of lumbar spine [M79.18]  - Primary    Chronic bilateral low back pain without sciatica [M54.50, G89.29]    Myofascial pain syndrome, cervical [M79.18]  Myofascial pain syndrome of thoracic spine [M79.18]  Comments: Diagnosis: Chronic cervical, thoracic, and lumbar spine pain. Suspect myofascial component. History of SLE and RA.    For low back: Evaluate kinetic chain, address muscle imbalances. Emphasis on strengthening of core and lumbopelvic stabilizers.  For neck/upper back: Address posture. Focus on strengthening of scapular retractors and deep neck flexors. Chin tucks, low rows. Stretching of pectoralis muscles and upper trapezius. Discuss improvements to ergonomics.    Soft tissue mobilization and myofascial release PRN    Subjective:  Dad had a heart attack on Oct 19th. I also had sharp pain around my back and into rib cage. I was reaching down in to the laundry machine. It hurts constantly. Does not change, only when I lift my arm up.   Pain:     Current pain rating:  7    At best pain rating:  7  At worst pain rating:  10  Location:  Diffuse lumbar, thoracic, and cervical pain    Quality:  Sharp and aching    Relieving factors:  Heat    Aggravating factors:  On the move, rising from sitting, walking and standing    Pain related Behaviors:  None    Progression:  No change    Red Flags:  None  Precautions/Equipment   Precautions:  Osteopenia and Other    Current Braces/Orthoses:  None    Equipment Currently Used:  Single point cane (sometimes)  Prior Functional Status     Physical limitation(s):  Unable to work >1 year due to physical deconditioning limitations secondary to lupus with pericarditis hospitialization  Current Functional Status:    unsteady gait, limited walking tolerance, limited standing tolerance, limited lifting, limited exercise and limited household activities  Social Support:     Communication Preference:  Verbal, written and visual  Barriers to Learning:  No Barriers    Work/School:  Unable to work for at least one year  Treatments:     Previous treatment:  Physical therapy  Patient Goals:     Patient/Family goals for therapy:  Decreased pain, increased strength, increased ROM and improved ambulation    Other patient goals:  Walking more than 2 miles.      OBJECTIVE     Range of Motion - Lumbar Spine       No ROM Loss  (0%) Min ROM Loss  (1-25%) Mod ROM Loss  (26-65%) Major ROM Loss  (66-100%)    Symptoms    Flexion     x  Increased pain left lower back    Extension    x    Increased pain left lower back    Side Bend Right    x    Increased pain left lower back    Side Bend Left    x   Increased pain left lower back    Sideglide Right  x      Increased pain left lower back    Sideglide Left  x      Increased pain left lower back        Thoracic AROM (10/30)  Flexion: WNL and no change in pain   Extension: WNL and no change in pain   Rotation L:  WNL and no change in pain   Rotation R:  WNL and no change in pain       Shoulder AROM   R flexion increases right side flank pain (10/30)    Cervical AROM   Flexion: 50 deg with Left UT pain   Extension: 40 deg with Left UT pain   SB R: 30 deg with left UT pain (most painful)   SB L: 30 deg with Left UT pain   Rotation R: 60 deg with left UT pain  (2nd most painful)   Rotation L: 60 deg  with left UT pain      TTP: Left upper trap, left lower lumbar paraspinals into fascia and upper posterior hip     Gait: decreased lumbar rotation, decreased gait speed, 100 feet, increased pain in legs     Sensation: reports occasional BLE numbness     DLTT: 45 deg    30s STS: 5 reps (with increased diffuse joint pain)   DNF Endurance Test: 14s with increased pain L UT     TREATMENT RENDERED     Therapeutic exercise: 25 Minutes   Performed with direct PT  demonstration, instruction, supervision, and guidance.   - Reassessment of symptoms  - UT stretch seated 3x30s for R   - review chin tuck and PPT - restart if able   - L stretch for back and shoulder - demo only   - aquatic education     Manual Therapy: 15 Minutes   - gentle STM and DTP to B UT - hypertonicity worse on R       Next Visit Plan: gentle STM UT,  pool education     core and lumbopelvic stabilizers.  For neck/upper back: Address posture. Focus on strengthening of scapular retractors and deep neck flexors. Chin tucks, low rows. Stretching of pectoralis muscles and upper trapezius.     Total Treatment Time: 40 Minutes        Therapeutic Interventions Charges  $$ Therapeutic Exercise [mins]: 25  $$ Manual Therapy [mins]: 15                 I attest that I have reviewed the above information.  Signed: Barron Alvine, PT, DPT  06/10/2023 12:02 PM        I reviewed the no-show/attendance policy with the patient and caregiver(s). The patient is aware that they must call to cancel appointments more than 24 hours in advance. They are also aware that if they late cancel or no-show three times, we reserve the right to cancel their remaining appointments. This policy is in place to allow [place] to best serve the needs of our caseload.    If patient returns to clinic with variance in plan of care, then it may be attributable to one or more of the following factors: preferred clinician availability, appointment time request availability, therapy pool appointment availability, major holiday with clinic closure, caregiver availability, patient transportation, conflicting medical appointment, inclement weather, and/or patient illness.    If patient does not return for follow up visit(s) related to this episode of care, this note will serve as their discharge note from Physical Therapy.

## 2023-06-16 NOTE — Unmapped (Signed)
LM for pt. today. Offered the referral to [place] pain mgmt. if she would like. Perhaps things have changed since she sent this msg. She has been going to PT, per their notes in chart. She has a RTC here on 11/19 so can discuss further at appt. I asked her to either send a My Chart msg. or call me (# provided) if she would like the referral placed.

## 2023-06-17 NOTE — Unmapped (Unsigned)
[facility] PT AQUATIC THERAPY [place]  OUTPATIENT PHYSICAL THERAPY  06/22/2023          Patient Name: Julia Bates  Date of Birth:August 23, 1985  Diagnosis:   Encounter Diagnoses   Name Primary?    Physical deconditioning Yes    Other systemic lupus erythematosus with pericarditis (CMS-HCC)     Myofascial pain syndrome of thoracic spine     Myofascial pain syndrome, cervical     Myofascial pain syndrome of lumbar spine     Chronic bilateral low back pain without sciatica        Referring Provider: Reesa Chew B    Date of Onset of Impairment: 05/21/2018  Date PT Care Plan Established or Reviewed: 05/22/2023  Date PT Treatment Started: 05/22/2023     Medicaid auth:   Authorization Period: 08/11/2022 - 08/11/2023   Referral Status: Authorized   Requested visits: 27     Session Number:  3    ASSESSMENT & PLAN   Assessment  Assessment details:    Today:   Fawn presents for aquatic physical therapy for diffuse chronic pain throughout cervical, thoracic, and lumbar spine in the setting of SLE and RA. Primary concern today is ongoing R flank pain x3-4 weeks now. The buoyancy properties of water enabled the patient to complete hip and spinal mobility exercises. Pt noted increased R flank pain (about the level of the 9th rib) with trunk extension, L lateral flexion, and R rotation. She was able to perform Ai Chi exercises with deep breathing into trunk rotation and extension, which increased pain transiently, though flank pain improved from 7/10 to 5/10 by end of session indicating fair pain irritability and stiffness pain pattern. HEP progressed with trunk rotation and breathing exercises. Aquatic exercise intensity and duration adjusted to appropriately challenge and progress functional strength deficits. Maitte will benefit from continued skilled physical therapy to maximize her functional independence and safety.    Background:  05/22/23: Eval: Annalouise is a pleasant 37 y.o. female who presents for Physical Therapy Evaluation with diffuse chronic pain throughout cervical, thoracic, and lumbar spine in the setting of SLE and RA. Clinical presentation today is consistent with myofascial pain syndrome in the regions listed above. Will benefit from lumbopelvic stability training, upper back strengthening, and gentle myofascial release. We also discussed a trial of aquatic PT to address pain symptoms. The patient will benefit from skilled Physical Therapy intervention to address the mild impairments listed below and to assist the patient in maximizing her functional independence and safe return to prior level of function.               Impairments: decreased endurance, pain, decreased strength, decreased range of motion and impaired motor control      Personal Factors/Comorbidities: 3+    Specific Comorbidities: SLE, osteopenia, chronic pain    Examination of Body Systems: musculoskeletal, activity/participation and communication    Clinical Presentation: stable    Clinical Decision Making: low    Prognosis: good prognosis    Positive Prognosis Rationale: age and motivated for treatment.  Negative Prognosis Rationale: medical status/condition, chronicity of condition and severity of symptoms.      Therapy Goals      Goals:        1. In 12 weeks the patient will demonstrate independent performance of HEP to maintain functional gains.   2. In 12 weeks the patient will demonstrate >75% of lumbar flexion AROM to indicate progression towards return to PLOF.   3. In  12 weeks the patient will demonstrate a walking tolerance of 5 minutes with the LRAD to indicate improved activity tolerance.   4. In 12 weeks the patient will complete an additional 3 STS transfers during the 30s chair stand test to met the MDC and to demonstrate increased functional leg strength.        Plan    Therapy options: will be seen for skilled physical therapy services    Planned therapy interventions: Balance Training, Education - Patient, Endurance Activites, Functional Mobility, Home Exercise Program, Education - Family/Caregiver, E-Stim, [employer], Civil engineer, contracting, Diaphragmatic/Pursed-lip Breathing, 97113-Aquatic Therapy, 97112-Neuromuscular Re-education, 97110-Therapeutic Exercises, 97116-Gait Training, 97140-Manual Therapy, 97530-Therapeutic Activities, 97535-Self-Care/Home Training, 97750-Physical Performance Test and 78295, 20561-Dry Needling 1-2, 3+ areas    DME Equipment: [employer].    Frequency: one time every other week.    Duration in weeks: 12    Education provided to: patient.    Education provided: HEP, Treatment options and plan, Symptom management, Safety education, Importance of Therapy, Anatomy, Body mechanics, Role of therapy in Rehabilitation, Posture, Community resources and Body awareness    Education results: verbalized good understanding, demonstrates understanding and needs reinforcement.    Communication/Consultation: N/A.              SUBJECTIVE         History of Present Condition     History of Present Condition/Chief Complaint:  Myofascial pain syndrome of lumbar spine [M79.18]  - Primary    Chronic bilateral low back pain without sciatica [M54.50, G89.29]    Myofascial pain syndrome, cervical [M79.18]  Myofascial pain syndrome of thoracic spine [M79.18]  Comments: Diagnosis: Chronic cervical, thoracic, and lumbar spine pain. Suspect myofascial component. History of SLE and RA.    For low back: Evaluate kinetic chain, address muscle imbalances. Emphasis on strengthening of core and lumbopelvic stabilizers.  For neck/upper back: Address posture. Focus on strengthening of scapular retractors and deep neck flexors. Chin tucks, low rows. Stretching of pectoralis muscles and upper trapezius. Discuss improvements to ergonomics.    Soft tissue mobilization and myofascial release PRN    Subjective:  Today: Still having R flank pain since Oct 19th when she reached into a top loader washing machine and may have moved wrong. Pain is worse with laying on either side, but R > L or twisting to the R side. X-ray on 10/22 neg. Pian currently 7/10.   Pain:     Current pain rating:  7    At best pain rating:  7    At worst pain rating:  10  Pain Comments: Hx: diffuse lumbar, thoracic, and cervical pain    Location:  Today: R flank, L Low back    Quality:  Sharp and aching    Relieving factors:  Heat    Aggravating factors:  On the move, rising from sitting, walking and standing    Pain related Behaviors:  None    Progression:  No change    Red Flags:  None  Precautions/Equipment   Precautions:  Osteopenia and Other    Current Braces/Orthoses:  None    Equipment Currently Used:  Single point cane (sometimes)  Prior Functional Status     Physical limitation(s):  Unable to work >1 year due to physical deconditioning limitations secondary to lupus with pericarditis hospitialization  Current Functional Status:    unsteady gait, limited walking tolerance, limited standing tolerance, limited lifting, limited exercise and limited household activities  Social Support:     Communication Preference:  Verbal,  written and visual  Barriers to Learning:  No Barriers    Work/School:  Unable to work for at least one year  Treatments:     Previous treatment:  Physical therapy  Patient Goals:     Patient/Family goals for therapy:  Decreased pain, increased strength, increased ROM and improved ambulation    Other patient goals:  Walking more than 2 miles.      OBJECTIVE     Range of Motion - Lumbar Spine       No ROM Loss  (0%) Min ROM Loss  (1-25%) Mod ROM Loss  (26-65%) Major ROM Loss  (66-100%)    Symptoms    Flexion     x  Increased pain left lower back    Extension    x    Increased pain left lower back    Side Bend Right    x    Increased pain left lower back    Side Bend Left    x   Increased pain left lower back    Sideglide Right  x      Increased pain left lower back    Sideglide Left  x      Increased pain left lower back        Thoracic AROM (10/30)  Flexion: WNL and no change in pain   Extension: WNL and no change in pain   Rotation L:  WNL and no change in pain   Rotation R:  WNL and no change in pain       Shoulder AROM   R flexion increases right side flank pain (10/30)    Cervical AROM   Flexion: 50 deg with Left UT pain   Extension: 40 deg with Left UT pain   SB R: 30 deg with left UT pain (most painful)   SB L: 30 deg with Left UT pain   Rotation R: 60 deg with left UT pain  (2nd most painful)   Rotation L: 60 deg  with left UT pain      TTP: Left upper trap, left lower lumbar paraspinals into fascia and upper posterior hip     Gait: decreased lumbar rotation, decreased gait speed, 100 feet, increased pain in legs     Sensation: reports occasional BLE numbness     DLTT: 45 deg    30s STS: 5 reps (with increased diffuse joint pain)   DNF Endurance Test: 14s with increased pain L UT     Aquatic Physical Therapy Attestation:   Keali E. Clara will benefit from aquatic therapy given clinical presentation of: pain limiting functional movement or exercise tolerance, strength limiting functional movement or exercise, limited tolerance to weightbearing, and motor control deficits    During initial Physical Therapy Evaluation the patient was screened and identified the following precautions:   No relative contraindications identified     The following conditions require medical provider clearance prior to aquatic therapy:   No conditions identified     The following conditions were not reported or observed: nausea/vomiting, fever greater than 100 deg, diarrhea/bowel incontinence in the last two weeks, viral/bacterial/fungal disease. The patient verbalizes understanding that the session will be cancelled if any of the above conditions develop. YES    Aquatic Therapy Education:  - Discussed POC: bridge to land therapy  - Advised patient to wear shoes and bring assistive device if applicable while ambulating on pool deck   - Advised patient to bring water and snacks as appropriate   -  Advised patient only members of the [facility] are permitted to use amenities such as the hot tub and sauna  - Land re-evaluation scheduled within 30 days of initial pool session by evaluating therapist: yes  - Anticipated method of entry:  8 stairs Independently  - Focus for aquatic therapist: Bridge to land therapy, Pain reduction, LE Strengthening, UE Strengthening, and Core Strengthening    TREATMENT RENDERED       Total Treatment Time: 40 Minutes    Aquatic Therapy   Performed with direct PT instruction, demonstration, supervision, and guidance  - Reassessment of symptoms     Ambulation: in 3 ft ft depth water x 2-3 laps each  - forward  - sidestepping  - backward     Standing LE: in 3 ft depth water x 10 reps bilaterally  - on 8 inch step:   Alt step taps   Forward step-up    Ai Chi series: Chest Depth gentle, slow muscle activation/relaxation movement sequence derived from Ai Chi principles  x5-10 ea for pain reduction.   - Freeing: B shoulder / thoracic spine BILATERAL (alteranating) rotation with rail assistance, shoulders at 90 deg ABD   - Gathering: B shoulder / thoracic spine UNILATERAL rotation with rail assistance, shoulders at 90 deg ABD  - Rocking Horse: B shoulder horz AB <> AD with alt weight shift FW and total spine FF then weight shift BW and total spine EXT  Added UE paddle resistance x5-10 ea with progressively increased speed for core, hip and shoulder girdle strength and stability and spinal and hip mobility.     Deep well: supported by noodle wrapped behind (x 30-60 sec ea) with 5# ankle weights   - bicycling forwards  - bicycle backwards  - hip abd/add  - skiing  - double knees to chest  - B hip flexion sustained with alt trunk rotation       Aquatic HEP: Has access to [facility], has not used the pool there yet.     Next Visit Plan: trunk and rib mobility with deep breathing, gentle STM UT,  pool education     core and lumbopelvic stabilizers.  For neck/upper back: Address posture. Focus on strengthening of scapular retractors and deep neck flexors. Chin tucks, low rows. Stretching of pectoralis muscles and upper trapezius.           Therapeutic Interventions Charges  $$ Aquatic Therapy [mins]: 45                 I attest that I have reviewed the above information.  Signed: Cassell Smiles, PT, DPT  06/22/2023 12:35 PM        I reviewed the no-show/attendance policy with the patient and caregiver(s). The patient is aware that they must call to cancel appointments more than 24 hours in advance. They are also aware that if they late cancel or no-show three times, we reserve the right to cancel their remaining appointments. This policy is in place to allow [place] to best serve the needs of our caseload.    If patient returns to clinic with variance in plan of care, then it may be attributable to one or more of the following factors: preferred clinician availability, appointment time request availability, therapy pool appointment availability, major holiday with clinic closure, caregiver availability, patient transportation, conflicting medical appointment, inclement weather, and/or patient illness.    If patient does not return for follow up visit(s) related to this episode of care, this  note will serve as their discharge note from Physical Therapy. this episode of care, this note will serve as their discharge note from Physical Therapy.

## 2023-06-18 DIAGNOSIS — M545 Chronic bilateral low back pain without sciatica: Principal | ICD-10-CM

## 2023-06-18 DIAGNOSIS — G8929 Other chronic pain: Principal | ICD-10-CM

## 2023-06-18 DIAGNOSIS — M7918 Myalgia, other site: Principal | ICD-10-CM

## 2023-06-22 ENCOUNTER — Ambulatory Visit
Admit: 2023-06-22 | Payer: PRIVATE HEALTH INSURANCE | Attending: Rehabilitative and Restorative Service Providers" | Primary: Rehabilitative and Restorative Service Providers"

## 2023-06-29 DIAGNOSIS — G8929 Other chronic pain: Principal | ICD-10-CM

## 2023-06-29 MED ORDER — OXYCODONE 5 MG TABLET
ORAL_TABLET | Freq: Every day | ORAL | 0 refills | 30 days | PRN
Start: 2023-06-29 — End: ?

## 2023-06-30 MED ORDER — OXYCODONE 5 MG TABLET
ORAL_TABLET | Freq: Every day | ORAL | 0 refills | 30 days | Status: CP | PRN
Start: 2023-06-30 — End: ?

## 2023-06-30 NOTE — Unmapped (Signed)
Oxycodone refill  Last ov: 06/02/2023  Next ov: 09/02/2023

## 2023-07-03 NOTE — Unmapped (Signed)
[facility] Special[facility] and [facility] Delivery Pharm[facility] Refill Coordination Note    Julia Bates, Julia Bates: 10-Apr-1986  Phone: [phone] (home)       All above HIPAA information was verified with patient.         07/03/2023    12:34 PM   Specialty Rx Medication Refill Questionnaire   Which Medications would you like refilled and shipped? Mycophenolate1 1/2 week, norethindrone 4 pills left, losartan about 2 weeks left   If medication refills are not needed at this time, please indicate the reason below. Omeprazole i have 3 weeks left   Please list all current allergies: Sulfa   Have you missed any doses in the last 30 days? No   Have you had any changes to your medication(s) since your last refill? No   How many days remaining of each medication do you have at home? About 1 to 2 weeks except biirth control   Have you experienced any side effects in the last 30 days? No   Please enter the full address (street address, city, state, zip code) where you would like your medication(s) to be delivered to. [address], [place] [address]   Please specify on which day you would like your medication(s) to arrive. Note: if you need your medication(s) within 3 days, please call the pharmacy to schedule your order at [phone]  07/08/2023   Has your insurance changed since your last refill? No   Would you like a pharmacist to call you to discuss your medication(s)? No   Do you require a signature for your package? (Note: if we are billing Medicare Part B or your order contains a controlled substance, we will require a signature) No         Completed refill call assessment today to schedule patient's medication shipment from the [facility] [phone]).  All relevant notes have been reviewed.       Confirmed patient received a Conservation officer, historic buildings and a Surveyor, mining with first shipment. The patient will receive a drug information handout for each medication shipped and additional FDA Medication Guides as required.         REFERRAL TO PHARMACIST     Referral to the pharmacist: Not needed      [facility]     Shipping address confirmed in Epic.     Delivery Scheduled: Yes, Expected medication delivery date: 11/27.     Medication will be delivered via Next Day Courier to the prescription address in Epic WAM.    Westley Gambles   [facility] Specialty Technician

## 2023-07-06 NOTE — Unmapped (Signed)
[facility] PT AQUATIC THERAPY [place]  OUTPATIENT PHYSICAL THERAPY  07/06/2023  Note Type: Treatment Note       Patient Name: Julia Bates  Date of Birth:11-Jun-1986  Diagnosis:   Encounter Diagnoses   Name Primary?    Physical deconditioning Yes    Other systemic lupus erythematosus with pericarditis (CMS-HCC)     Myofascial pain syndrome of thoracic spine     Myofascial pain syndrome, cervical     Myofascial pain syndrome of lumbar spine     Chronic bilateral low back pain without sciatica        Referring Provider: Reesa Chew B    Date of Onset of Impairment: 05/21/2018  Date PT Care Plan Established or Reviewed: 05/22/2023  Date PT Treatment Started: 05/22/2023     Medicaid auth:   Authorization Period: 08/11/2022 - 08/11/2023   Referral Status: Authorized   Requested visits: 27     Session Number:  4    ASSESSMENT & PLAN   Assessment  Assessment details:    Today:   Julia Bates presents for aquatic physical therapy for diffuse chronic pain throughout cervical, thoracic, and lumbar spine in the setting of SLE and RA. R flank pain is improving and did not worsen with exercise today. The buoyancy properties of water enabled the patient to complete hip and spinal mobility exercises. She was able to perform Ai Chi exercises with deep breathing into trunk rotation and extension without provoking flank pain today (improved compared to last session. Pt progressed to dynamic plank series today with difficulty and fatigue, but good tolerance. HEP progressed with additional trunk rotations and inclined planks. Aquatic exercise intensity and duration adjusted to appropriately challenge and progress functional strength deficits. Modena will benefit from continued skilled physical therapy to maximize her functional independence and safety.    Background:  05/22/23: Eval: Evella is a pleasant 37 y.o. female who presents for Physical Therapy Evaluation with diffuse chronic pain throughout cervical, thoracic, and lumbar spine in the setting of SLE and RA. Clinical presentation today is consistent with myofascial pain syndrome in the regions listed above. Will benefit from lumbopelvic stability training, upper back strengthening, and gentle myofascial release. We also discussed a trial of aquatic PT to address pain symptoms. The patient will benefit from skilled Physical Therapy intervention to address the mild impairments listed below and to assist the patient in maximizing her functional independence and safe return to prior level of function.               Impairments: decreased endurance, pain, decreased strength, decreased range of motion and impaired motor control      Personal Factors/Comorbidities: 3+    Specific Comorbidities: SLE, osteopenia, chronic pain    Examination of Body Systems: musculoskeletal, activity/participation and communication    Clinical Presentation: stable    Clinical Decision Making: low    Prognosis: good prognosis    Positive Prognosis Rationale: age and motivated for treatment.  Negative Prognosis Rationale: medical status/condition, chronicity of condition and severity of symptoms.      Therapy Goals      Goals:        1. In 12 weeks the patient will demonstrate independent performance of HEP to maintain functional gains.   2. In 12 weeks the patient will demonstrate >75% of lumbar flexion AROM to indicate progression towards return to PLOF.   3. In 12 weeks the patient will demonstrate a walking tolerance of 5 minutes with the LRAD to indicate improved  activity tolerance.   4. In 12 weeks the patient will complete an additional 3 STS transfers during the 30s chair stand test to met the MDC and to demonstrate increased functional leg strength.        Plan    Therapy options: will be seen for skilled physical therapy services    Planned therapy interventions: Balance Training, Education - Patient, Endurance Activites, Functional Mobility, Home Exercise Program, Education - Family/Caregiver, E-Stim, [employer], Civil engineer, contracting, Diaphragmatic/Pursed-lip Breathing, 97113-Aquatic Therapy, 97112-Neuromuscular Re-education, 97110-Therapeutic Exercises, 97116-Gait Training, 97140-Manual Therapy, 97530-Therapeutic Activities, 97535-Self-Care/Home Training, 97750-Physical Performance Test and 60630, 20561-Dry Needling 1-2, 3+ areas    DME Equipment: [employer].    Frequency: one time every other week.    Duration in weeks: 12    Education provided to: patient.    Education provided: HEP, Treatment options and plan, Symptom management, Safety education, Importance of Therapy, Anatomy, Body mechanics, Role of therapy in Rehabilitation, Posture, Community resources and Body awareness    Education results: verbalized good understanding, demonstrates understanding and needs reinforcement.    Communication/Consultation: N/A.              SUBJECTIVE         History of Present Condition     History of Present Condition/Chief Complaint:  Myofascial pain syndrome of lumbar spine [M79.18]  - Primary    Chronic bilateral low back pain without sciatica [M54.50, G89.29]    Myofascial pain syndrome, cervical [M79.18]  Myofascial pain syndrome of thoracic spine [M79.18]  Comments: Diagnosis: Chronic cervical, thoracic, and lumbar spine pain. Suspect myofascial component. History of SLE and RA.    For low back: Evaluate kinetic chain, address muscle imbalances. Emphasis on strengthening of core and lumbopelvic stabilizers.  For neck/upper back: Address posture. Focus on strengthening of scapular retractors and deep neck flexors. Chin tucks, low rows. Stretching of pectoralis muscles and upper trapezius. Discuss improvements to ergonomics.    Soft tissue mobilization and myofascial release PRN    Subjective:  Today: Still having R flank pain since Oct 19th though pain is better than it was. Pain is worse with laying on either side, but R > L or twisting to the R side. Pain was worse the morning after doing opened book exercise with 10/10 R flank pain last 15 minutes, though she has been able to do the exercise since without issues.   Pain:     Current pain rating:  5    At best pain rating:  5    At worst pain rating:  10  Pain Comments: Hx: diffuse lumbar, thoracic, and cervical pain    Location:  Today: R flank, L Low back    Quality:  Sharp and aching    Relieving factors:  Heat    Aggravating factors:  On the move, rising from sitting, walking and standing    Pain related Behaviors:  None    Progression:  No change    Red Flags:  None  Precautions/Equipment   Precautions:  Osteopenia and Other    Current Braces/Orthoses:  None    Equipment Currently Used:  Single point cane (sometimes)  Prior Functional Status     Physical limitation(s):  Unable to work >1 year due to physical deconditioning limitations secondary to lupus with pericarditis hospitialization  Current Functional Status:    unsteady gait, limited walking tolerance, limited standing tolerance, limited lifting, limited exercise and limited household activities  Social Support:     Communication Preference:  Verbal, written and  visual  Barriers to Learning:  No Barriers    Work/School:  Unable to work for at least one year  Treatments:     Previous treatment:  Physical therapy  Patient Goals:     Patient/Family goals for therapy:  Decreased pain, increased strength, increased ROM and improved ambulation    Other patient goals:  Walking more than 2 miles.      OBJECTIVE     Range of Motion - Lumbar Spine       No ROM Loss  (0%) Min ROM Loss  (1-25%) Mod ROM Loss  (26-65%) Major ROM Loss  (66-100%)    Symptoms    Flexion     x  Increased pain left lower back    Extension    x    Increased pain left lower back    Side Bend Right    x    Increased pain left lower back    Side Bend Left    x   Increased pain left lower back    Sideglide Right  x      Increased pain left lower back    Sideglide Left  x      Increased pain left lower back        Thoracic AROM (10/30)  Flexion: WNL and no change in pain   Extension: WNL and no change in pain   Rotation L:  WNL and no change in pain   Rotation R:  WNL and no change in pain       Shoulder AROM   R flexion increases right side flank pain (10/30)    Cervical AROM   Flexion: 50 deg with Left UT pain   Extension: 40 deg with Left UT pain   SB R: 30 deg with left UT pain (most painful)   SB L: 30 deg with Left UT pain   Rotation R: 60 deg with left UT pain  (2nd most painful)   Rotation L: 60 deg  with left UT pain      TTP: Left upper trap, left lower lumbar paraspinals into fascia and upper posterior hip     Gait: decreased lumbar rotation, decreased gait speed, 100 feet, increased pain in legs     Sensation: reports occasional BLE numbness     DLTT: 45 deg    30s STS: 5 reps (with increased diffuse joint pain)   DNF Endurance Test: 14s with increased pain L UT     Aquatic Physical Therapy Attestation:   Julia Bates will benefit from aquatic therapy given clinical presentation of: pain limiting functional movement or exercise tolerance, strength limiting functional movement or exercise, limited tolerance to weightbearing, and motor control deficits    During initial Physical Therapy Evaluation the patient was screened and identified the following precautions:   No relative contraindications identified     The following conditions require medical provider clearance prior to aquatic therapy:   No conditions identified     The following conditions were not reported or observed: nausea/vomiting, fever greater than 100 deg, diarrhea/bowel incontinence in the last two weeks, viral/bacterial/fungal disease. The patient verbalizes understanding that the session will be cancelled if any of the above conditions develop. YES    Aquatic Therapy Education:  - Discussed POC: bridge to land therapy  - Advised patient to wear shoes and bring assistive device if applicable while ambulating on pool deck   - Advised patient to bring water and snacks as appropriate   -  Advised patient only members of the [facility] are permitted to use amenities such as the hot tub and sauna  - Land re-evaluation scheduled within 30 days of initial pool session by evaluating therapist: yes  - Anticipated method of entry:  8 stairs Independently  - Focus for aquatic therapist: Bridge to land therapy, Pain reduction, LE Strengthening, UE Strengthening, and Core Strengthening    TREATMENT RENDERED       Total Treatment Time: 55 Minutes    Aquatic Therapy 55 min  Performed with direct PT instruction, demonstration, supervision, and guidance  - Reassessment of symptoms     Ambulation: in 3 ft ft depth water x 2-3 laps each  - forward  - sidestepping  - backward     Standing LE: in 3 ft depth water x 10 reps bilaterally  - on 8 inch step:   Squats   Split stance squats    Posterior step down taps     Ai Chi series: Chest Depth gentle, slow muscle activation/relaxation movement sequence derived from Ai Chi principles  x5-10 ea for pain reduction.   - Freeing: B shoulder / thoracic spine BILATERAL (alteranating) rotation with rail assistance, shoulders at 90 deg ABD   - Gathering: B shoulder / thoracic spine UNILATERAL rotation with rail assistance, shoulders at 90 deg ABD  Added UE paddle resistance x5-10 ea with progressively increased speed for core, hip and shoulder girdle strength and stability and spinal and hip mobility.     Standing core at barre: 3 ft of water   - inclined plank at barre with alt shoulder tap 1x20  - side plank, inclined at barre with hip ABD 1x10  - inclined plank at barre with modified bird dog 1x10 alt UE/LE FF/EXT, 5 sec ea    Deep well: supported by noodle wrapped behind (x 30-60 sec ea) with 5# ankle weights   - bicycling forwards  - bicycle backwards  - hip abd/add  - skiing  - double knees to chest  - B hip flexion sustained with alt trunk rotation       Aquatic HEP: Has access to [facility], has not used the pool there yet. -planning to go this week or next    Next Visit Plan: trunk and rib mobility with deep breathing, gentle STM UT,  pool education     core and lumbopelvic stabilizers.  For neck/upper back: Address posture. Focus on strengthening of scapular retractors and deep neck flexors. Chin tucks, low rows. Stretching of pectoralis muscles and upper trapezius.           Therapeutic Interventions Charges  $$ Aquatic Therapy [mins]: 55                 I attest that I have reviewed the above information.  Signed: Cassell Smiles, PT, DPT  07/06/2023 10:49 AM        I reviewed the no-show/attendance policy with the patient and caregiver(s). The patient is aware that they must call to cancel appointments more than 24 hours in advance. They are also aware that if they late cancel or no-show three times, we reserve the right to cancel their remaining appointments. This policy is in place to allow [place] to best serve the needs of our caseload.    If patient returns to clinic with variance in plan of care, then it may be attributable to one or more of the following factors: preferred clinician availability, appointment time request availability, therapy pool  appointment availability, major holiday with clinic closure, caregiver availability, patient transportation, conflicting medical appointment, inclement weather, and/or patient illness.    If patient does not return for follow up visit(s) related to this episode of care, this note will serve as their discharge note from Physical Therapy.

## 2023-07-08 MED FILL — OMEPRAZOLE 20 MG CAPSULE,DELAYED RELEASE: ORAL | 90 days supply | Qty: 90 | Fill #3

## 2023-07-08 MED FILL — LOSARTAN 50 MG TABLET: ORAL | 90 days supply | Qty: 90 | Fill #3

## 2023-07-08 MED FILL — MYCOPHENOLATE MOFETIL 500 MG TABLET: ORAL | 30 days supply | Qty: 120 | Fill #1

## 2023-07-08 NOTE — Unmapped (Signed)
Julia Bates 's entire shipment will be rescheduled as a result of scheduled for delivery on Thanksgiving Day.     I have spoken with the patient  at [phone]  and communicated the delivery change. We will reschedule the medication for the delivery date that the patient agreed upon.  We have confirmed the delivery date as 07/08/2023, via same day courier.

## 2023-07-15 ENCOUNTER — Ambulatory Visit
Admit: 2023-07-15 | Discharge: 2023-07-16 | Payer: PRIVATE HEALTH INSURANCE | Attending: Student in an Organized Health Care Education/Training Program | Primary: Student in an Organized Health Care Education/Training Program

## 2023-07-15 DIAGNOSIS — M545 Chronic bilateral low back pain without sciatica: Principal | ICD-10-CM

## 2023-07-15 DIAGNOSIS — M7918 Myalgia, other site: Principal | ICD-10-CM

## 2023-07-15 DIAGNOSIS — G8929 Other chronic pain: Principal | ICD-10-CM

## 2023-07-15 MED ORDER — DULOXETINE 30 MG CAPSULE,DELAYED RELEASE
ORAL_CAPSULE | Freq: Every day | ORAL | 1 refills | 180 days | Status: CP
Start: 2023-07-15 — End: 2023-07-15

## 2023-07-15 MED ORDER — MELOXICAM 15 MG TABLET
ORAL_TABLET | Freq: Every day | ORAL | 0 refills | 14 days | Status: CP
Start: 2023-07-15 — End: ?

## 2023-07-15 NOTE — Unmapped (Addendum)
Dear Ms. Sigg,    Thank you for coming to [facility].    -Initiate meloxicam 15mg  daily for 14 days  -Please Return in about 3 months (around 10/13/2023).    You may contact me through MyChart or call our office with any questions.     If you develop any red flag signs such as new or progressive motor weakness, sensory deficits, saddle anesthesia (groin numbness), bowel/bladder dysfunction, gait/coordination disturbance, weight loss, or night pain, you should call our office and/or seek prompt evaluation at the nearest ER.    Delories Heinz, DO  Interventional Pain Medicine

## 2023-07-15 NOTE — Unmapped (Signed)
PM&R - [facility]     Referring Provider: Albesa Seen, MD  Primary Care Provider: Loran Senters, FNP    Julia Bates is a 37 y.o. old female  has a past medical history of Abnormal Pap smear of cervix, Allergic, Cataract, Deep vein thrombosis (CMS-HCC) (May 24 2022), Dysplasia of cervix, low grade (CIN 1) (08/10/2018), Essential hypertension (09/23/2022), GERD (gastroesophageal reflux disease) (Oct 23), HPV (human papilloma virus) infection, Lupus, Lupus, Rheumatoid arthritis (CMS-HCC) (11/18/2015), and SLE (systemic lupus erythematosus related syndrome) (CMS-HCC). who is being seen today by Delories Heinz, DO, at the consultation request of Albesa Seen, MD for new patient office visit.    Pertinent medical records reviewed for this visit.    Chief Complaint:   Chief Complaint   Patient presents with    Back Pain     Onset of approx 46yrs that has worsened.  Noted to the lower back and up and down the entire back on the right side       History of Present Illness:    Pain Description:  The pain started many years ago without inciting event  Since the pain first started, it is worse.  The most significant location of pain is the low back without radicular pain   The 1-2 words that best describe the pain: sharp and aching  The pain improves with oxycodone, heating.  The pain is made worse with standing for prolonged time  The average daily pain score is 7/10. The pain can be as high as 9/10 at its worst.  The pain interferes with: All activities.    Red flag symptoms:  Weakness: Yes - subjective, due to lupus  Bladder incontinence: No   Bowel incontinence: No   Saddle anesthesia: No     Therapies:  Previously attempted interventions for this pain concern:   -None    Has physical therapy been initiated or completed for this pain concern? Yes  Where was physical therapy completed? At [facility] based clinic  Has a home exercise program (directed by a physical therapist or medical provider) been completed for this pain concern? Yes    Medications:  Currently utilized over the counter medications/therapy for pain: Acetaminophen/Tylenol    Currently utilized prescription medications/therapies for pain: Muscle relaxers   -Tizanidine 2mg  nightly PRN    Currently utilized controlled medications: Pure opioid agonist (Oxycodone/Hydrocodone/Dilaudid/Nucynta/Fentanyl/Methadone)  - Oxycodone - Rx from rheumatology     Is the patient on a blood thinner (including aspirin, Goody/BC/Bayer powder)? No   - Name of anticoagulant/blood thinner: Not applicable   - Used to take eliquis for DVT - completed in April 2024  ____________________________________________________________________  Past Medical History:  Past Medical History:   Diagnosis Date    Abnormal Pap smear of cervix     Allergic     Sulfa    Cataract     Deep vein thrombosis (CMS-HCC) May 24 2022    Dysplasia of cervix, 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    ECC pos CIN 1--12/19    Essential hypertension 09/23/2022    GERD (gastroesophageal reflux disease) Oct 23    HPV (human papilloma virus) infection     Lupus     Lupus     Rheumatoid arthritis (CMS-HCC) 11/18/2015    SLE (systemic lupus erythematosus related syndrome) (CMS-HCC)        Past Surgical History:  Past Surgical History:   Procedure Laterality Date    CERVICAL BIOPSY  W/ LOOP ELECTRODE EXCISION  COLPOSCOPY      SKIN BIOPSY  2013-2014       Family History:  Family History   Problem Relation Age of Onset    Glaucoma Father     Diabetes Father     Hypertension Father     No Known Problems Mother     No Known Problems Sister     Diabetes Sister     Diabetes Sister     Melanoma Neg Hx     Basal cell carcinoma Neg Hx     Squamous cell carcinoma Neg Hx     Macular degeneration Neg Hx     Blindness Neg Hx        Social History:  Social History     Tobacco Use    Smoking status: Never     Passive exposure: Never    Smokeless tobacco: Never    Tobacco comments:     None   Substance Use Topics Alcohol use: Never       Allergies:  Sulfa (sulfonamide antibiotics)    Current Medications:  Current Outpatient Medications   Medication Sig Dispense Refill    calcium carbonate-vitamin D3 600 mg(1,500mg ) -200 unit per tablet Take 1 tablet by mouth Two (2) times a day.      diclofenac sodium (VOLTAREN) 1 % gel Apply 2 g topically four (4) times a day. 100 g 5    ergocalciferol, vitamin D2, (VITAMIN D2 ORAL) Take 1,000 Units by mouth daily with evening meal.      hydroxychloroquine (PLAQUENIL) 200 mg tablet Take 1.5 tablets (300 mg total) by mouth daily. If unable to tolerate the smell of splitting tab - can take 200mg  on MWF, 400mg  other days of the week 135 tablet 3    hydrOXYzine (ATARAX) 25 MG tablet Take 1 tablet (25 mg total) by mouth every eight (8) hours as needed. 90 tablet 3    losartan (COZAAR) 50 MG tablet Take 1 tablet (50 mg total) by mouth daily. 90 tablet 3    melatonin 5 mg tablet Take 1 tablet (5 mg total) by mouth nightly.      mycophenolate (CELLCEPT) 500 mg tablet Take 2 tablets (1,000 mg total) by mouth two (2) times a day. 540 tablet 1    norethindrone (MICRONOR) 0.35 mg tablet Take 1 tablet by mouth daily. 84 tablet 3    omeprazole (PRILOSEC) 20 MG capsule Take 1 capsule (20 mg total) by mouth daily. 90 capsule 3    oxyCODONE (ROXICODONE) 5 MG immediate release tablet Take 1 tablet (5 mg total) by mouth daily as needed for pain. 30 tablet 0    predniSONE (DELTASONE) 1 MG tablet Take 3 tablets (3 mg total) by mouth daily. 90 tablet 3    tizanidine (ZANAFLEX) 2 MG tablet Take 1 tablet (2 mg total) by mouth every eight (8) hours as needed. 270 tablet 1    DULoxetine (CYMBALTA) 30 MG capsule Take 1 capsule (30 mg total) by mouth daily. Take 1 capsule 30mg  daily for 1 week, then if tolerated increase to 60mg  daily 180 capsule 1    meloxicam (MOBIC) 15 MG tablet Take 1 tablet (15 mg total) by mouth daily. Take with food. 14 tablet 0     No current facility-administered medications for this visit. Review of Systems:  Pertinent positive/negative findings related to today's visit are noted in the HPI and Assessment/Plan.  ____________________________________________________________________  Vitals:  Vitals:    07/15/23 1119   Temp: 36.6 ??C (97.9 ??F)  Weight: 64.9 kg (143 lb)   Height: 149.9 cm (4' 11.02)     Physical Exam:    Constitutional: Well developed, Well nourished, No acute distress and Interactive.  General: Patient is alert and orientated.  Psychological: The patient's mood is normal, and appropriate for the circumstances.    Pain (Neuro/Musculoskeletal) Exam:  Gait: Normal. Able to toe and heel walk. Tandem gait intact. Uses assist device - none  Reflexes: No clonus    Reflex LEFT RIGHT   Patellar 2+ 2+   Achilles 2+ 2+     Motor   Muscle  LEFT RIGHT    EHL (L5) 5/5 5/5   TA (L4) 5/5 5/5   GS (S1) 5/5 5/5   Quad (L3) 5/5 5/5   HS (L2) 5/5 5/5   IS 5/5 5/5     Appropriate strength without any gross motor deficit appreciated.    Sensory:  Intact to light touch of all four extremities    Special Exams:  Lumbar:  Positive lumbar paraspinal tenderness  bilaterally  Positive pain with lumbar facet loading  bilaterally .  Negative SLR bilaterally  ____________________________________________________________________  Controlled Substance Monitoring:  NCPDMP: Reviewed Today    Prescriptions  Total: 26 - Private Pay: 7  Showing 1-15 of 26 Items View 15 Items   of 2   Filled  Written  Sold  ID  Drug  QTY  Days  Prescriber  RX #  Dispenser  Refill  Daily Dose*  Pymt Type  PMP    06/30/2023 06/30/2023  1 Oxycodone Hcl (Ir) 5 Mg Tablet 30.00 30 Gr E [id number] Nor (1409) 0/0 7.50 MME Medicaid [place]   05/30/2023 05/27/2023  1 Oxycodone Hcl (Ir) 5 Mg Tablet 30.00 30 Gr E [id number] Nor (1409) 0/0 7.50 MME Medicaid [place]   04/30/2023 04/30/2023  1 Oxycodone Hcl (Ir) 5 Mg Tablet 30.00 30 Gr E [id number] Nor (1409) 0/0 7.50 MME Medicaid [place]   03/31/2023 03/31/2023  1 Oxycodone Hcl (Ir) 5 Mg Tablet 30.00 30 Sa She [id number] Nor (1409) 0/0 7.50 MME Medicaid [place]   02/27/2023 02/27/2023  1 Oxycodone Hcl (Ir) 5 Mg Tablet 30.00 30 Gr E [id number] Nor (1409) 0/0 7.50 MME Medicaid [place]     ____________________________________________________________________  Imaging and Diagnostic Studies:  All applicable diagnostic studies related to this consultation have been reviewed.  Relevant diagnostic reports and/or my personal review of imaging or other diagnostic studies listed below:    MRI Lumbar Spine Wo Contrast  Status: Final result     PACS Images     Show images for MRI Lumbar Spine Wo Contrast MRI Screening Form    Screening Form      Impression   -- Disc bulge and facet arthrosis at L5-S1 resulting in moderate to severe left and moderate right neural foraminal narrowing. Otherwise patent spinal canal and neuroforamina.              Narrative   EXAM: Magnetic resonance imaging, spinal canal and contents, lumbar, without contrast material.   DATE: 11/24/2022 1:10 PM   ACCESSION: [id number]   DICTATED: 11/24/2022 1:23 PM   INTERPRETATION LOCATION: [facility]      CLINICAL INDICATION: 37 years old Female with chronic LBP  - M54.50 - Chronic bilateral low back pain without sciatica - G89.29 - Chronic bilateral low back pain without sciatica.      COMPARISON: Lumbar spine CT 09/30/2022.      TECHNIQUE: Multiplanar MRI was performed through the lumbar spine  without intravenous contrast.      FINDINGS:   Diffuse marrow heterogeneity with suppression on fat saturated sequence likely representing degenerative fatty changes. The visualized cord is unremarkable and the conus medullaris ends at a normal level.      The vertebral bodies are normally aligned. Disc spaces are preserved.      L5-S1: Disc bulge and bilateral facet arthrosis contributes to moderate to severe left and moderate right neural foraminal narrowing.      Otherwise, no significant spinal canal or neural foraminal narrowing.      The paraspinal tissues are within normal limits.      For the purposes of this dictation, the lowest well formed intervertebral disc space is assumed to be the L5-S1 level, and there are presumed to be five lumbar-type vertebral bodies.        ____________________________________________________________________  Relevant Labs Reviewed: Below labs reviewed    Lab Results   Component Value Date    WBC 3.1 (L) 06/02/2023    HGB 12.7 06/02/2023    HCT 38.9 06/02/2023    MCV 84.0 06/02/2023    PLT 225 06/02/2023       No results found for: BMP, CMP     Lab Results   Component Value Date    CREATININE 0.89 06/02/2023       No results found for: HGBA1C  ____________________________________________________________________  Diagnosis:   Diagnosis ICD-10-CM Associated Orders   1. Chronic bilateral low back pain without sciatica  M54.50 Ambulatory referral to Spine Center    G89.29       2. Myofascial pain syndrome of lumbar spine  M79.18 Ambulatory referral to Spine Center      3. Myofascial pain syndrome, cervical  M79.18 Ambulatory referral to Spine Center      4. Myofascial pain syndrome of thoracic spine  M79.18 Ambulatory referral to Spine Center          Assessment and Plan:  Julia Bates is a 37 y.o. old female referred from Albesa Seen, MD presenting for new patient consultation.    Patient presents today for low back pain without radicular symptoms. She denies any weakness, sensory deficits, or red flag symptoms. Her symptoms are most consistent with degenerative disc disease and lumbar facet mediated pain. We discussed her MRI lumbar spine imaging which demonstrates L5-S1 disc bulge and facet hypertrophy contributing to severe L and moderate R NFS. Patient has been participating in PT. We discussed several medications. She has tried gabapentin, lyrica and duloxetine previously with side effects. Discussed trial of meloxicam for short period of time. Recommend continuing PT and return to clinic in 3 months to reassess pain.     Interventional treatments:  -None    Medication recommendations:  -Start meloxicam 15mg  daily with food. Stop all other NSAIDs. Discussed with patient that NSAIDs are to be used for short duration and long term use can lead to renal, cardiac, and/or GI side effects. Patient understands not to use other NSAIDs while taking NSAIDs prescribed. Answered all questions and patient would like to proceed with NSAIDs use.     Physical therapy:  -The patient is actively involved in a physical therapy regimen for the above pain concern and is attending therapy sessions.     Imaging/diagnostic studies recommended:  -None    Consults/referrals placed:  -None    Follow up recommendation:  -Follow up in 3 months     Treatment plan fully discussed and agreed upon with the patient. All  questions were answered to her satisfaction today.    Medical decision making for this patient was moderately complex given the patient's preexisting comorbidities, chronicity of the patient's current pathology, review of relevant records from other healthcare provides, independent interpretation of imaging and/or labs if appropriate, discussion regarding risks and benefits of proceeding forward with or holding off on scheduling a minor procedure if appropriate and designated above, the severity/progression of the pathology discussed, specific medication if reviewed, discussion regarding financial implications of treatment, and/or the risk of complication and/or morbidity that may exist with or without treatment.    Delories Heinz, DO   Interventional Pain/Spine Medicine  Assistant Professor - Physical Medicine & Rehabilitation   [employer]

## 2023-07-22 ENCOUNTER — Ambulatory Visit
Admit: 2023-07-22 | Payer: PRIVATE HEALTH INSURANCE | Attending: Rehabilitative and Restorative Service Providers" | Primary: Rehabilitative and Restorative Service Providers"

## 2023-07-22 NOTE — Unmapped (Signed)
[facility] PT [facility] [place]  OUTPATIENT PHYSICAL THERAPY  07/22/2023          Patient Name: Julia Bates  Date of Birth:[birth year]  Diagnosis:   Encounter Diagnoses   Name Primary?    Physical deconditioning Yes    Other systemic lupus erythematosus with pericarditis (CMS-HCC)     Myofascial pain syndrome of thoracic spine     Myofascial pain syndrome, cervical     Myofascial pain syndrome of lumbar spine     Chronic bilateral low back pain without sciatica        Referring Provider: Reesa Chew B    Date of Onset of Impairment: 05/21/2018  Date PT Care Plan Established or Reviewed: 05/22/2023  Date PT Treatment Started: 05/22/2023     Medicaid auth:   Authorization Period: 08/11/2022 - 08/11/2023   Referral Status: Authorized   Requested visits: 27     Session Number:  5    ASSESSMENT & PLAN   Assessment  Assessment details:    Today:   Julia Bates presents for follow-up physical therapy for diffuse chronic pain throughout cervical, thoracic, and lumbar spine in the setting of SLE and RA. R flank pain is still improving. Lumbar AROM improved and less painful compared to initial evaluation. R flank pain reproduced with L sidebend ROM and with palpation of R QL. HEP progressed with core strengthening exercises, which pt tolerated well without incased pain post-Tx. Scheduled 1 additional aquatic sesison. Plan to assess progress and consider discharge at final session after the holidays. Julia Bates will benefit from continued skilled physical therapy to maximize her functional independence and safety.    Background:  05/22/23: Eval: Julia Bates is a pleasant 37 y.o. female who presents for Physical Therapy Evaluation with diffuse chronic pain throughout cervical, thoracic, and lumbar spine in the setting of SLE and RA. Clinical presentation today is consistent with myofascial pain syndrome in the regions listed above. Will benefit from lumbopelvic stability training, upper back strengthening, and gentle myofascial release. We also discussed a trial of aquatic PT to address pain symptoms. The patient will benefit from skilled Physical Therapy intervention to address the mild impairments listed below and to assist the patient in maximizing her functional independence and safe return to prior level of function.     07/15/23: PM&R: Recommend continuing PT and return to clinic in 3 months to reassess pain.           Impairments: decreased endurance, pain, decreased strength, decreased range of motion and impaired motor control      Personal Factors/Comorbidities: 3+    Specific Comorbidities: SLE, osteopenia, chronic pain    Examination of Body Systems: musculoskeletal, activity/participation and communication    Clinical Presentation: stable    Clinical Decision Making: low    Prognosis: good prognosis    Positive Prognosis Rationale: age and motivated for treatment.  Negative Prognosis Rationale: medical status/condition, chronicity of condition and severity of symptoms.      Therapy Goals      Goals:        1. In 12 weeks the patient will demonstrate independent performance of HEP to maintain functional gains.   2. In 12 weeks the patient will demonstrate >75% of lumbar flexion AROM to indicate progression towards return to PLOF.   3. In 12 weeks the patient will demonstrate a walking tolerance of 5 minutes with the LRAD to indicate improved activity tolerance.   4. In 12 weeks the patient will complete an additional 3 STS transfers  during the 30s chair stand test to met the MDC and to demonstrate increased functional leg strength.        Plan    Therapy options: will be seen for skilled physical therapy services    Planned therapy interventions: Balance Training, Education - Patient, Endurance Activites, Functional Mobility, Home Exercise Program, Education - Family/Caregiver, [employer], [employer], Civil engineer, contracting, Diaphragmatic/Pursed-lip Breathing, 97113-Aquatic Therapy, 97112-Neuromuscular Re-education, 97110-Therapeutic Exercises, 97116-Gait Training, 97140-Manual Therapy, 97530-Therapeutic Activities, 97535-Self-Care/Home Training, 97750-Physical Performance Test and 96295, 20561-Dry Needling 1-2, 3+ areas    DME Equipment: [employer].    Frequency: one time every other week.    Duration in weeks: 12    Education provided to: patient.    Education provided: HEP, Treatment options and plan, Symptom management, Safety education, Importance of Therapy, Anatomy, Body mechanics, Role of therapy in Rehabilitation, Posture, Community resources and Body awareness    Education results: verbalized good understanding, demonstrates understanding and needs reinforcement.    Communication/Consultation: N/A.              SUBJECTIVE         History of Present Condition     History of Present Condition/Chief Complaint:  Myofascial pain syndrome of lumbar spine [M79.18]  - Primary    Chronic bilateral low back pain without sciatica [M54.50, G89.29]    Myofascial pain syndrome, cervical [M79.18]  Myofascial pain syndrome of thoracic spine [M79.18]  Comments: Diagnosis: Chronic cervical, thoracic, and lumbar spine pain. Suspect myofascial component. History of SLE and RA.    For low back: Evaluate kinetic chain, address muscle imbalances. Emphasis on strengthening of core and lumbopelvic stabilizers.  For neck/upper back: Address posture. Focus on strengthening of scapular retractors and deep neck flexors. Chin tucks, low rows. Stretching of pectoralis muscles and upper trapezius. Discuss improvements to ergonomics.    Soft tissue mobilization and myofascial release PRN    Subjective:  Today: Doing ok. Notes she had less pain after aquatic therapy. Saw pain management and added a medication a week ago, but not noticing a change yet. Still having R flank pain since Oct 19th though pain is better than it was, currently 2/10. She notes she can now tolerate laying on her side, twisting, and bending.  Pain:     Current pain rating:  2    At best pain rating:  2    At worst pain rating:  10  Pain Comments: Hx: diffuse lumbar, thoracic, and cervical pain    Location:  Today: R flank, L Low back    Quality:  Sharp and aching    Relieving factors:  Heat    Aggravating factors:  On the move, rising from sitting, walking and standing    Pain related Behaviors:  None    Progression:  No change    Red Flags:  None  Precautions/Equipment   Precautions:  Osteopenia and Other    Current Braces/Orthoses:  None    Equipment Currently Used:  Single point cane (sometimes)  Prior Functional Status     Physical limitation(s):  Unable to work >1 year due to physical deconditioning limitations secondary to lupus with pericarditis hospitialization  Current Functional Status:    unsteady gait, limited walking tolerance, limited standing tolerance, limited lifting, limited exercise and limited household activities  Social Support:     Communication Preference:  Verbal, written and visual  Barriers to Learning:  No Barriers    Work/School:  Unable to work for at least one year  Treatments:  Previous treatment:  Physical therapy  Patient Goals:     Patient/Family goals for therapy:  Decreased pain, increased strength, increased ROM and improved ambulation    Other patient goals:  Walking more than 2 miles.      OBJECTIVE     Range of Motion - Lumbar Spine       No ROM Loss  (0%) Min ROM Loss  (1-25%) Mod ROM Loss  (26-65%) Major ROM Loss  (66-100%)    Symptoms    Flexion  05/22/23  07/22/23      x     x    P! left lower back   No pain   Extension  05/22/23  07/22/23      x  x      P! left lower back   P! Central low back   Side Bend Right  05/22/23  07/22/23      x    x        P! left lower back   No pain   Side Bend Left  05/22/23  07/22/23          x  x     P! left lower back   Stretch R flank   Sideglide Right  05/22/23  07/22/23    x  x        P! left lower back   No pain   Sideglide Left  05/22/23  07/22/23      x  x        P! left lower back   No pain       Thoracic AROM (10/30)  Flexion: WNL and no change in pain   Extension: WNL and no change in pain   Rotation L:  WNL and no change in pain   Rotation R:  WNL and no change in pain       Shoulder AROM   R flexion increases right side flank pain (10/30)    Cervical AROM   Flexion: 50 deg with Left UT pain   Extension: 40 deg with Left UT pain   SB R: 30 deg with left UT pain (most painful)   SB L: 30 deg with Left UT pain   Rotation R: 60 deg with left UT pain  (2nd most painful)   Rotation L: 60 deg  with left UT pain      TTP: Left upper trap, left lower lumbar paraspinals into fascia and upper posterior hip   R paraspinals and QL TTP, reproduction of familiar R LBP with DTP to R QL    Gait: decreased lumbar rotation, decreased gait speed, 100 feet, increased pain in legs     Sensation: reports occasional BLE numbness     DLTT: 45 deg    30s STS: 5 reps (with increased diffuse joint pain)   DNF Endurance Test: 14s with increased pain L UT     Aquatic Physical Therapy Attestation:   Scotlyn E. Krzywicki will benefit from aquatic therapy given clinical presentation of: pain limiting functional movement or exercise tolerance, strength limiting functional movement or exercise, limited tolerance to weightbearing, and motor control deficits    During initial Physical Therapy Evaluation the patient was screened and identified the following precautions:   No relative contraindications identified     The following conditions require medical provider clearance prior to aquatic therapy:   No conditions identified     The following conditions were not reported or observed: nausea/vomiting, fever greater  than 100 deg, diarrhea/bowel incontinence in the last two weeks, viral/bacterial/fungal disease. The patient verbalizes understanding that the session will be cancelled if any of the above conditions develop. YES    Aquatic Therapy Education:  - Discussed POC: bridge to land therapy  - Advised patient to wear shoes and bring assistive device if applicable while ambulating on pool deck   - Advised patient to bring water and snacks as appropriate   - Advised patient only members of the [facility] are permitted to use amenities such as the hot tub and sauna  - Land re-evaluation scheduled within 30 days of initial pool session by evaluating therapist: yes  - Anticipated method of entry:  8 stairs Independently  - Focus for aquatic therapist: Bridge to land therapy, Pain reduction, LE Strengthening, UE Strengthening, and Core Strengthening    TREATMENT RENDERED       Total Treatment Time: 45 Minutes    Therapeutic Exercise: 45 min  - Nustep L5 x 6 min  - assessed lumbar AROM   - palpation: R paraspinals and QL TTP, reproduction of familiar R LBP with DTP to R QL  - supine dead bug beginner 2x10   Attempted dead bug advance 1x5 -increase central LBP to 8/10  - PPT reviewed and attempted dead bug advanced with PPT -still painful -discontinued  - supine crunch 2x15   - prone swimmer 2x10  - standing sidebend with 8 lb DB (QL) 1x10 B (mild R LBP during, resolved immediately after)  - pt education for QL release with tennis ball and hip hike for active release    Aquatic HEP: Has access to [employer], has been 2x and plans to attend 2-3 x per week.     Next Visit Plan: trunk and rib mobility with deep breathing, gentle STM UT,  pool education     core and lumbopelvic stabilizers.  For neck/upper back: Address posture. Focus on strengthening of scapular retractors and deep neck flexors. Chin tucks, low rows. Stretching of pectoralis muscles and upper trapezius.           Therapeutic Interventions Charges  $$ Therapeutic Exercise [mins]: 45                 I attest that I have reviewed the above information.  Signed: Cassell Smiles, PT, DPT  07/22/2023 12:11 PM        I reviewed the no-show/attendance policy with the patient and caregiver(s). The patient is aware that they must call to cancel appointments more than 24 hours in advance. They are also aware that if they late cancel or no-show three times, we reserve the right to cancel their remaining appointments. This policy is in place to allow [place] to best serve the needs of our caseload.    If patient returns to clinic with variance in plan of care, then it may be attributable to one or more of the following factors: preferred clinician availability, appointment time request availability, therapy pool appointment availability, major holiday with clinic closure, caregiver availability, patient transportation, conflicting medical appointment, inclement weather, and/or patient illness.    If patient does not return for follow up visit(s) related to this episode of care, this note will serve as their discharge note from Physical Therapy.

## 2023-07-23 MED ORDER — DULOXETINE 30 MG CAPSULE,DELAYED RELEASE
ORAL_CAPSULE | Freq: Every evening | ORAL | 2 refills | 30.00 days | Status: CP
Start: 2023-07-23 — End: 2023-10-21

## 2023-07-29 DIAGNOSIS — G8929 Other chronic pain: Principal | ICD-10-CM

## 2023-07-29 MED ORDER — OXYCODONE 5 MG TABLET
ORAL_TABLET | Freq: Every day | ORAL | 0 refills | 30.00 days | PRN
Start: 2023-07-29 — End: ?

## 2023-07-29 NOTE — Unmapped (Signed)
Oxycodone refill  Last Visit Date: 06/02/2023  Next Visit Date: 09/02/2023

## 2023-07-29 NOTE — Unmapped (Signed)
Already sent today to  [id number] - [place], [place] - [address]  [address], [place] [address]  Phone: [phone]

## 2023-07-30 NOTE — Unmapped (Signed)
[facility] PT AQUATIC THERAPY [place]  OUTPATIENT PHYSICAL THERAPY  07/30/2023          Patient Name: Julia Bates  Date of Birth:06/17/1986  Diagnosis:   Encounter Diagnoses   Name Primary?    Physical deconditioning Yes    Other systemic lupus erythematosus with pericarditis (CMS-HCC)     Myofascial pain syndrome of thoracic spine     Myofascial pain syndrome, cervical     Myofascial pain syndrome of lumbar spine     Chronic bilateral low back pain without sciatica        Referring Provider: Reesa Chew B    Date of Onset of Impairment: 05/21/2018  Date PT Care Plan Established or Reviewed: 05/22/2023  Date PT Treatment Started: 05/22/2023     Medicaid auth:   Authorization Period: 08/11/2022 - 08/11/2023   Referral Status: Authorized   Requested visits: 27     Session Number:  6    ASSESSMENT & PLAN   Assessment  Assessment details:    Today:   Julia Bates presents for aquatic physical therapy for diffuse chronic pain throughout cervical, thoracic, and lumbar spine in the setting of SLE and RA. The buoyancy properties of water enabled the patient to complete hip and spinal mobility exercises. She was able to perform Ai Chi exercises with deep breathing into trunk rotation and extension without pain. Pt demonstrating improving activity tolerance and reduced pain irritability. She is on track for discharge next session if progress continues. Aquatic exercise intensity and duration adjusted to appropriately challenge and progress functional strength deficits. Julia Bates will benefit from continued skilled physical therapy to maximize her functional independence and safety.    Background:  05/22/23: Eval: Julia Bates is a pleasant 37 y.o. female who presents for Physical Therapy Evaluation with diffuse chronic pain throughout cervical, thoracic, and lumbar spine in the setting of SLE and RA. Clinical presentation today is consistent with myofascial pain syndrome in the regions listed above. Will benefit from lumbopelvic stability training, upper back strengthening, and gentle myofascial release. We also discussed a trial of aquatic PT to address pain symptoms. The patient will benefit from skilled Physical Therapy intervention to address the mild impairments listed below and to assist the patient in maximizing her functional independence and safe return to prior level of function.               Impairments: decreased endurance, pain, decreased strength, decreased range of motion and impaired motor control      Personal Factors/Comorbidities: 3+    Specific Comorbidities: SLE, osteopenia, chronic pain    Examination of Body Systems: musculoskeletal, activity/participation and communication    Clinical Presentation: stable    Clinical Decision Making: low    Prognosis: good prognosis    Positive Prognosis Rationale: age and motivated for treatment.  Negative Prognosis Rationale: medical status/condition, chronicity of condition and severity of symptoms.      Therapy Goals      Goals:        1. In 12 weeks the patient will demonstrate independent performance of HEP to maintain functional gains.   2. In 12 weeks the patient will demonstrate >75% of lumbar flexion AROM to indicate progression towards return to PLOF.   3. In 12 weeks the patient will demonstrate a walking tolerance of 5 minutes with the LRAD to indicate improved activity tolerance.   4. In 12 weeks the patient will complete an additional 3 STS transfers during the 30s chair stand test to met  the MDC and to demonstrate increased functional leg strength.        Plan    Therapy options: will be seen for skilled physical therapy services    Planned therapy interventions: Balance Training, Education - Patient, Endurance Activites, Functional Mobility, Home Exercise Program, Education - Family/Caregiver, E-[employer]im, [employer], Civil engineer, contracting, Diaphragmatic/Pursed-lip Breathing, 97113-Aquatic Therapy, 97112-Neuromuscular Re-education, 97110-Therapeutic Exercises, 97116-Gait Training, 97140-Manual Therapy, 97530-Therapeutic Activities, 97535-Self-Care/Home Training, 97750-Physical Performance Test and 75643, 20561-Dry Needling 1-2, 3+ areas    DME Equipment: [employer].    Frequency: one time every other week.    Duration in weeks: 12    Education provided to: patient.    Education provided: HEP, Treatment options and plan, Symptom management, Safety education, Importance of Therapy, Anatomy, Body mechanics, Role of therapy in Rehabilitation, Posture, Community resources and Body awareness    Education results: verbalized good understanding, demonstrates understanding and needs reinforcement.    Communication/Consultation: N/A.              SUBJECTIVE         History of Present Condition     History of Present Condition/Chief Complaint:  Myofascial pain syndrome of lumbar spine [M79.18]  - Primary    Chronic bilateral low back pain without sciatica [M54.50, G89.29]    Myofascial pain syndrome, cervical [M79.18]  Myofascial pain syndrome of thoracic spine [M79.18]  Comments: Diagnosis: Chronic cervical, thoracic, and lumbar spine pain. Suspect myofascial component. History of SLE and RA.    For low back: Evaluate kinetic chain, address muscle imbalances. Emphasis on strengthening of core and lumbopelvic stabilizers.  For neck/upper back: Address posture. Focus on strengthening of scapular retractors and deep neck flexors. Chin tucks, low rows. Stretching of pectoralis muscles and upper trapezius. Discuss improvements to ergonomics.    Soft tissue mobilization and myofascial release PRN    Subjective:  Today: Doing ok, R side pain is 2/10, upper back pain is 3/10. Her new HEP made her very sore for a few days, but she has since been able to do her exercises with decreasing soreness after.   Pain:     Current pain rating:  3    At best pain rating:  2    At worst pain rating:  4  Pain Comments: Hx: diffuse lumbar, thoracic, and cervical pain    Location:  Today: R flank, L Low back    Quality:  Sharp and aching    Relieving factors:  Heat    Aggravating factors:  On the move, rising from sitting, walking and standing    Pain related Behaviors:  None    Progression:  No change    Red Flags:  None  Precautions/Equipment   Precautions:  Osteopenia and Other    Current Braces/Orthoses:  None    Equipment Currently Used:  Single point cane (sometimes)  Prior Functional Status     Physical limitation(s):  Unable to work >1 year due to physical deconditioning limitations secondary to lupus with pericarditis hospitialization  Current Functional Status:    unsteady gait, limited walking tolerance, limited standing tolerance, limited lifting, limited exercise and limited household activities  Social Support:     Communication Preference:  Verbal, written and visual  Barriers to Learning:  No Barriers    Work/School:  Unable to work for at least one year  Treatments:     Previous treatment:  Physical therapy  Patient Goals:     Patient/Family goals for therapy:  Decreased pain, increased strength, increased ROM and  improved ambulation    Other patient goals:  Walking more than 2 miles.      OBJECTIVE     Range of Motion - Lumbar Spine       No ROM Loss  (0%) Min ROM Loss  (1-25%) Mod ROM Loss  (26-65%) Major ROM Loss  (66-100%)    Symptoms    Flexion     x  Increased pain left lower back    Extension    x    Increased pain left lower back    Side Bend Right    x    Increased pain left lower back    Side Bend Left    x   Increased pain left lower back    Sideglide Right  x      Increased pain left lower back    Sideglide Left  x      Increased pain left lower back        Thoracic AROM (10/30)  Flexion: WNL and no change in pain   Extension: WNL and no change in pain   Rotation L:  WNL and no change in pain   Rotation R:  WNL and no change in pain       Shoulder AROM   R flexion increases right side flank pain (10/30)    Cervical AROM   Flexion: 50 deg with Left UT pain   Extension: 40 deg with Left UT pain   SB R: 30 deg with left UT pain (most painful)   SB L: 30 deg with Left UT pain   Rotation R: 60 deg with left UT pain  (2nd most painful)   Rotation L: 60 deg  with left UT pain      TTP: Left upper trap, left lower lumbar paraspinals into fascia and upper posterior hip     Gait: decreased lumbar rotation, decreased gait speed, 100 feet, increased pain in legs     Sensation: reports occasional BLE numbness     DLTT: 45 deg    30s STS: 5 reps (with increased diffuse joint pain)   DNF Endurance Test: 14s with increased pain L UT     Aquatic Physical Therapy Attestation:   Sheresa E. Siroky will benefit from aquatic therapy given clinical presentation of: pain limiting functional movement or exercise tolerance, strength limiting functional movement or exercise, limited tolerance to weightbearing, and motor control deficits    During initial Physical Therapy Evaluation the patient was screened and identified the following precautions:   No relative contraindications identified     The following conditions require medical provider clearance prior to aquatic therapy:   No conditions identified     The following conditions were not reported or observed: nausea/vomiting, fever greater than 100 deg, diarrhea/bowel incontinence in the last two weeks, viral/bacterial/fungal disease. The patient verbalizes understanding that the session will be cancelled if any of the above conditions develop. YES    Aquatic Therapy Education:  - Discussed POC: bridge to land therapy  - Advised patient to wear shoes and bring assistive device if applicable while ambulating on pool deck   - Advised patient to bring water and snacks as appropriate   - Advised patient only members of the [facility] are permitted to use amenities such as the hot tub and sauna  - Land re-evaluation scheduled within 30 days of initial pool session by evaluating therapist: yes  - Anticipated method of entry:  8 stairs Independently  - Focus  for aquatic therapist: Bridge to land therapy, Pain reduction, LE Strengthening, UE Strengthening, and Core Strengthening    TREATMENT RENDERED       Total Treatment Time: 55 Minutes    Aquatic Therapy 55 min  Performed with direct PT instruction, demonstration, supervision, and guidance  - Reassessment of symptoms     Ambulation: in 3 ft ft depth water x 2-3 laps each  - forward  - sidestepping  - backward  - high knee march     Standing LE: in 3 ft depth water x 10 reps bilaterally  - on 8 inch step:   Squats   Split stance squats    Posterior step down taps     Ai Chi series: Chest Depth gentle, slow muscle activation/relaxation movement sequence derived from Ai Chi principles  x5-10 ea for pain reduction.   - Freeing: B shoulder / thoracic spine BILATERAL (alteranating) rotation with rail assistance, shoulders at 90 deg ABD   - Gathering: B shoulder / thoracic spine UNILATERAL rotation with rail assistance, shoulders at 90 deg ABD  Added UE paddle resistance x5-10 ea with progressively increased speed for core, hip and shoulder girdle strength and stability and spinal and hip mobility.     Standing core at barre: 3 ft of water   - inclined plank at barre with alt shoulder tap 1x20  - side plank, inclined at barre with hip ABD 1x10  - inclined plank at barre with modified bird dog 1x10 alt UE/LE FF/EXT, 5 sec ea    Deep well: supported by noodle wrapped behind (x 30-60 sec ea) with 5# ankle weights   - bicycling forwards  - bicycle backwards  - hip abd/add  - skiing  - double knees to chest  - B hip flexion sustained with alt trunk rotation       Aquatic HEP: Going regularly to [employer]     Next Visit Plan: trunk and rib mobility with deep breathing, gentle STM UT,  pool education     core and lumbopelvic stabilizers.  For neck/upper back: Address posture. Focus on strengthening of scapular retractors and deep neck flexors. Chin tucks, low rows. Stretching of pectoralis muscles and upper trapezius. Therapeutic Interventions Charges  $$ Aquatic Therapy [mins]: 45                 I attest that I have reviewed the above information.  Signed: Cassell Smiles, PT, DPT  07/30/2023 11:06 AM        I reviewed the no-show/attendance policy with the patient and caregiver(s). The patient is aware that they must call to cancel appointments more than 24 hours in advance. They are also aware that if they late cancel or no-show three times, we reserve the right to cancel their remaining appointments. This policy is in place to allow [place] to best serve the needs of our caseload.    If patient returns to clinic with variance in plan of care, then it may be attributable to one or more of the following factors: preferred clinician availability, appointment time request availability, therapy pool appointment availability, major holiday with clinic closure, caregiver availability, patient transportation, conflicting medical appointment, inclement weather, and/or patient illness.    If patient does not return for follow up visit(s) related to this episode of care, this note will serve as their discharge note from Physical Therapy.

## 2023-08-01 DIAGNOSIS — L299 Pruritus, unspecified: Principal | ICD-10-CM

## 2023-08-01 DIAGNOSIS — M3219 Other organ or system involvement in systemic lupus erythematosus: Principal | ICD-10-CM

## 2023-08-01 MED ORDER — HYDROXYZINE HCL 25 MG TABLET
ORAL_TABLET | Freq: Three times a day (TID) | ORAL | 3 refills | 30.00 days | PRN
Start: 2023-08-01 — End: ?

## 2023-08-01 MED ORDER — HYDROXYCHLOROQUINE 200 MG TABLET
ORAL_TABLET | Freq: Every day | ORAL | 3 refills | 90.00 days
Start: 2023-08-01 — End: 2024-07-31

## 2023-08-03 DIAGNOSIS — M3219 Other organ or system involvement in systemic lupus erythematosus: Principal | ICD-10-CM

## 2023-08-03 DIAGNOSIS — L299 Pruritus, unspecified: Principal | ICD-10-CM

## 2023-08-03 MED ORDER — HYDROXYZINE HCL 25 MG TABLET
ORAL_TABLET | Freq: Three times a day (TID) | ORAL | 3 refills | 30.00 days | Status: CP | PRN
Start: 2023-08-03 — End: ?
  Filled 2023-08-07: qty 90, 30d supply, fill #0

## 2023-08-03 MED ORDER — HYDROXYCHLOROQUINE 200 MG TABLET
ORAL_TABLET | Freq: Every day | ORAL | 3 refills | 90.00 days | Status: CP
Start: 2023-08-03 — End: 2024-08-02

## 2023-08-03 NOTE — Unmapped (Addendum)
Hcq and hydroxyzine refill  Last Visit Date: 06/02/2023  Next Visit Date: 09/02/2023

## 2023-08-06 NOTE — Unmapped (Signed)
[facility] Specialty and Home Delivery Pharmacy Refill Coordination Note    Julia Bates, Julia Bates: May 20, 1986  Phone: [phone] (home)       All above HIPAA information was verified with patient.         08/05/2023    11:40 PM   Specialty Rx Medication Refill Questionnaire   Which Medications would you like refilled and shipped? Hydroxyzine 25 mg tablet 2 pills left, Mycophenolate 500 mg tablet 1 week left. Need norethindrone tablets .35 mg birth control. Have 1 pack left.   If medication refills are not needed at this time, please indicate the reason below. Dont need duloxetine. I have a whole bottle and a half left. Dont need hydroxychloroquie. I have a whole bottle.   Please list all current allergies: Sulfa   Have you missed any doses in the last 30 days? No   Have you had any changes to your medication(s) since your last refill? No   How many days remaining of each medication do you have at home? 1 week on one and 2 days on the other   Have you experienced any side effects in the last 30 days? No   Please enter the full address (street address, city, state, zip code) where you would like your medication(s) to be delivered to. [address] , [place] [address]   Please specify on which day you would like your medication(s) to arrive. Note: if you need your medication(s) within 3 days, please call the pharmacy to schedule your order at [phone]  08/07/2023   Has your insurance changed since your last refill? No   Would you like a pharmacist to call you to discuss your medication(s)? No   Do you require a signature for your package? (Note: if we are billing Medicare Part B or your order contains a controlled substance, we will require a signature) No   Additional Comments: Please let me know what medicine I am able to get. Thank you         Completed refill call assessment today to schedule patient's medication shipment from the [facility] Delivery [facility] [phone]).  All relevant notes have been reviewed.       Confirmed patient received a Conservation officer, historic buildings and a Surveyor, mining with first shipment. The patient will receive a drug information handout for each medication shipped and additional FDA Medication Guides as required.         REFERRAL TO PHARMACIST     Referral to the pharmacist: Not needed      [facility]     Shipping address confirmed in Epic.     Delivery Scheduled: Yes, Expected medication delivery date: 08/07/23.     Medication will be delivered via Same Day Courier to the prescription address in Epic WAM.    Kerby Less   [address] Specialty and Home Delivery Pharmacy Specialty Technician

## 2023-08-07 MED FILL — MYCOPHENOLATE MOFETIL 500 MG TABLET: ORAL | 30 days supply | Qty: 120 | Fill #2

## 2023-08-13 NOTE — Unmapped (Unsigned)
Assessment and Plan:     There are no diagnoses linked to this encounter.       I personally spent *** minutes face-to-face and non-face-to-face in the care of this patient, which includes all pre, intra, and post visit time on the date of service.    No follow-ups on file.    HPI:      Julia Bates is here for No chief complaint on file.    {kerichronicdx:74702}    {keriacutedx:74703}     ROS:      Comprehensive 10 point ROS negative unless otherwise stated in the HPI.      [facility] Components:     Medication adherence and barriers to the treatment plan have been addressed. Opportunities to optimize healthy behaviors have been discussed. Patient / caregiver voiced understanding.    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    ECC pos CIN 1--12/19    Essential hypertension 09/23/2022    GERD (gastroesophageal reflux disease) Oct 23    HPV (human papilloma virus) infection     Lupus     Lupus     Rheumatoid arthritis (CMS-HCC) 11/18/2015    SLE (systemic lupus erythematosus related syndrome) (CMS-HCC)      Past Surgical History:   Procedure Laterality Date    CERVICAL BIOPSY  W/ LOOP ELECTRODE EXCISION      COLPOSCOPY      SKIN BIOPSY  2013-2014       Family History:     Family History   Problem Relation Age of Onset    Glaucoma Father     Diabetes Father     Hypertension Father     Heart attack Father     Neuropathy Mother     Osteoporosis Mother     No Known Problems Sister     Diabetes Sister     Diabetes Sister     Melanoma Neg Hx     Basal cell carcinoma Neg Hx     Squamous cell carcinoma Neg Hx     Macular degeneration Neg Hx     Blindness Neg Hx        Social History:     Social History     Tobacco Use    Smoking status: Never     Passive exposure: Never    Smokeless tobacco: Never    Tobacco comments:     None Vaping Use    Vaping status: Never Used   Substance Use Topics    Alcohol use: Never    Drug use: Never       Allergies:     Sulfa (sulfonamide antibiotics)    Current Medications:     Current Outpatient Medications   Medication Sig Dispense Refill    calcium carbonate-vitamin D3 600 mg(1,500mg ) -200 unit per tablet Take 1 tablet by mouth Two (2) times a day.      diclofenac sodium (VOLTAREN) 1 % gel Apply 2 g topically four (4) times a day. 100 g 5    DULoxetine (CYMBALTA) 30 MG capsule Take 1 capsule (30 mg total) by mouth nightly. 30 capsule 2    ergocalciferol, vitamin D2, (VITAMIN D2 ORAL) Take 1,000 Units by  mouth daily with evening meal.      hydroxychloroquine (PLAQUENIL) 200 mg tablet Take 1.5 tablets (300 mg total) by mouth daily. If unable to tolerate the smell of splitting tab - can take 200mg  on MWF, 400mg  other days of the week 135 tablet 3    hydrOXYzine (ATARAX) 25 MG tablet Take 1 tablet (25 mg total) by mouth every eight (8) hours as needed. 90 tablet 3    losartan (COZAAR) 50 MG tablet Take 1 tablet (50 mg total) by mouth daily. 90 tablet 3    melatonin 5 mg tablet Take 1 tablet (5 mg total) by mouth nightly.      meloxicam (MOBIC) 15 MG tablet Take 1 tablet (15 mg total) by mouth daily. Take with food. 14 tablet 0    mycophenolate (CELLCEPT) 500 mg tablet Take 2 tablets (1,000 mg total) by mouth two (2) times a day. 540 tablet 1    norethindrone (MICRONOR) 0.35 mg tablet Take 1 tablet by mouth daily. 84 tablet 3    omeprazole (PRILOSEC) 20 MG capsule Take 1 capsule (20 mg total) by mouth daily. 90 capsule 3    oxyCODONE (ROXICODONE) 5 MG immediate release tablet Take 1 tablet (5 mg total) by mouth daily as needed for pain. 30 tablet 0    predniSONE (DELTASONE) 1 MG tablet Take 3 tablets (3 mg total) by mouth daily. 90 tablet 3    tizanidine (ZANAFLEX) 2 MG tablet Take 1 tablet (2 mg total) by mouth every eight (8) hours as needed. 270 tablet 1     No current facility-administered medications for this visit.       Health Maintenance:     Health Maintenance   Topic Date Due    COVID-19 Vaccine (3 - Pfizer risk series) 10/27/2020    Influenza Vaccine (1) 04/12/2023    Pneumococcal Vaccine 0-64 (3 of 3 - PPSV23, PCV20 or PCV21) 10/28/2023 (Originally 08/06/2020)    Serum Creatinine Monitoring  06/01/2024    Potassium Monitoring  06/01/2024    DTaP/Tdap/Td Vaccines (4 - Td or Tdap) 04/09/2025    HPV Cotest with Pap Smear (21-65)  01/30/2028    Pap Smear with Cotest HPV (21-65)  02/04/2028    Hepatitis C Screen  Completed       Immunizations:     Immunization History   Administered Date(s) Administered    COVID-19 VAC,MRNA,TRIS(12Y UP)(PFIZER)(GRAY CAP) 09/08/2020    COVID-19 VACC,MRNA,(PFIZER)(PF) 09/29/2020    DTP 12/08/1990    HPV Quadrivalent (Gardasil) 03/24/2006, 06/01/2006, 10/02/2006    Influenza Vaccine Quad(IM)6 MO-Adult(PF) 06/05/2015, 05/06/2016, 07/07/2017, 07/06/2018, 04/29/2019, 05/17/2020    Influenza Virus Vaccine, unspecified formulation 05/06/2016, 07/07/2017    MMR 12/08/1990    Meningococcal C Conjugate 03/08/2004    Novel Influenza-h1n1-09, All Formulations 07/20/2008    PNEUMOCOCCAL POLYSACCHARIDE 23-VALENT 08/07/2015    PPD Test 05/20/2009    Pneumococcal Conjugate 13-Valent 04/10/2015    SHINGRIX-ZOSTER VACCINE (HZV),RECOMBINANT,ADJUVANTED(IM) 12/04/2021, 07/05/2022    TdaP 02/16/2012, 04/10/2015     I have reviewed and (if needed) updated the patient's problem list, medications, allergies, past medical and surgical history, social and family history.     Vital Signs:     Wt Readings from Last 3 Encounters:   07/15/23 64.9 kg (143 lb)   06/02/23 64.1 kg (141 lb 6.4 oz)   04/07/23 62.1 kg (136 lb 12.8 oz)     Temp Readings from Last 3 Encounters:   07/15/23 36.6 ??C (97.9 ??F)   06/02/23 36.3 ??C (97.3 ??  F) (Temporal)   04/07/23 36.2 ??C (97.2 ??F) (Temporal)     BP Readings from Last 3 Encounters:   06/02/23 139/99   04/07/23 122/87   03/25/23 108/66     Pulse Readings from Last 3 Encounters:   06/02/23 79   04/07/23 96   03/25/23 72     Estimated body mass index is 28.87 kg/m?? as calculated from the following:    Height as of 07/15/23: 149.9 cm (4' 11.02).    Weight as of 07/15/23: 64.9 kg (143 lb).  No height and weight on file for this encounter.      Objective:      General: Alert and oriented x3. Well-appearing. No acute distress. ***  HEENT:  Normocephalic.  Atraumatic. Conjunctiva and sclera normal. OP MMM without lesions. ***  Neck:  Supple. No thyroid enlargement. No adenopathy. ***  Heart:  Regular rate and rhythm. Normal S1, S2. No murmurs, rubs or gallops. ***  Lungs:  No respiratory distress.  Lungs clear to auscultation. No wheezes, rhonchi, or rales. ***  GI/GU:  Soft, +BS, nondistended, non-TTP. No palpable masses or organomegaly. ***  Extremities:  No edema. Peripheral pulses normal. ***  Skin:  Warm, dry. No rash or lesions present. ***  Neuro:  Non-focal. No obvious weakness. ***  Psych:  Affect normal, eye contact good, speech clear and coherent. ***       I attest that I, Noralyn Pick, personally documented this note while acting as scribe for Noralyn Pick, FNP.      Noralyn Pick, Scribe.  08/13/2023     The documentation recorded by the scribe accurately reflects the service I personally performed and the decisions made by me.     Noralyn Pick, FNP

## 2023-08-17 NOTE — Unmapped (Signed)
Duplicate message. Responded on other encounter.

## 2023-08-21 ENCOUNTER — Ambulatory Visit: Admit: 2023-08-21 | Discharge: 2023-08-22 | Payer: Vision Other Private Insurance | Attending: Family | Primary: Family

## 2023-08-21 DIAGNOSIS — Z13220 Encounter for screening for lipoid disorders: Principal | ICD-10-CM

## 2023-08-21 DIAGNOSIS — G4700 Insomnia, unspecified: Principal | ICD-10-CM

## 2023-08-21 MED ORDER — TRAZODONE 50 MG TABLET
ORAL_TABLET | Freq: Every evening | ORAL | 1 refills | 90.00 days | Status: CP
Start: 2023-08-21 — End: 2023-09-20

## 2023-08-21 NOTE — Unmapped (Signed)
Continue using Preparation-H for 3-5 days. If bump is still there in 1-2 weeks, please notify me.

## 2023-08-21 NOTE — Unmapped (Unsigned)
Assessment and Plan:     There are no diagnoses linked to this encounter.       I personally spent *** minutes face-to-face and non-face-to-face in the care of this patient, which includes all pre, intra, and post visit time on the date of service.    No follow-ups on file.    HPI:      Julia Bates is here for   Chief Complaint   Patient presents with    Rectal sore     Rectal sore x 1 week.  Tried Preparation H and it has gotten smaller.  Straining to have BM for years.  Fasting.    Immunizations     Declines flu and Prevnar 20.    Insomnia     Melatonin not helping.     {kerichronicdx:[id number]}  A week ago noted blood with defecation. Blood in stool. BRB. No pain. Feels a bump perianal.   BM every 2-3 days. Normal pattern for pt.   No n/v/d   No recent GI illness  No otc lax or colace.     2-3 bottles of water per day.     Insomnia  Tried melatonin 6-7 months,helped in the beginning.   5 mg dose.   Sleeps for 1-2 hours and wakes and unable to go back to sleep.   Bedtime around 11     PT nighttime exercise, stretches, mod crunches.   No caffeine. No hx of sleep issues. No perceived stress or anxiety.             {keriacutedx:74703}     ROS:      Comprehensive 10 point ROS negative unless otherwise stated in the HPI.      [facility] Components:     Medication adherence and barriers to the treatment plan have been addressed. Opportunities to optimize healthy behaviors have been discussed. Patient / caregiver voiced understanding.    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    ECC pos CIN 1--12/19    Essential hypertension 09/23/2022    GERD (gastroesophageal reflux disease) Oct 23    HPV (human papilloma virus) infection     Lupus     Lupus     Rheumatoid arthritis (CMS-HCC) 11/18/2015    SLE (systemic lupus erythematosus related syndrome) (CMS-HCC)      Past Surgical History:   Procedure Laterality Date    CERVICAL BIOPSY  W/ LOOP ELECTRODE EXCISION      COLPOSCOPY      SKIN BIOPSY  2013-2014       Family History:     Family History   Problem Relation Age of Onset    Glaucoma Father     Diabetes Father     Hypertension Father     Heart attack Father     Neuropathy Mother     Osteoporosis Mother     No Known Problems Sister     Diabetes Sister     Diabetes Sister     Melanoma Neg Hx     Basal cell carcinoma Neg Hx     Squamous cell carcinoma Neg Hx     Macular degeneration Neg Hx     Blindness Neg Hx  Social History:     Social History     Tobacco Use    Smoking status: Never     Passive exposure: Never    Smokeless tobacco: Never    Tobacco comments:     None   Vaping Use    Vaping status: Never Used   Substance Use Topics    Alcohol use: Never    Drug use: Never       Allergies:     Sulfa (sulfonamide antibiotics)    Current Medications:     Current Outpatient Medications   Medication Sig Dispense Refill    calcium carbonate-vitamin D3 600 mg(1,500mg ) -200 unit per tablet Take 1 tablet by mouth Two (2) times a day.      diclofenac sodium (VOLTAREN) 1 % gel Apply 2 g topically four (4) times a day. 100 g 5    DULoxetine (CYMBALTA) 30 MG capsule Take 1 capsule (30 mg total) by mouth nightly. 30 capsule 2    ergocalciferol, vitamin D2, (VITAMIN D2 ORAL) Take 1,000 Units by mouth daily with evening meal.      hydroxychloroquine (PLAQUENIL) 200 mg tablet Take 1.5 tablets (300 mg total) by mouth daily. If unable to tolerate the smell of splitting tab - can take 200mg  on MWF, 400mg  other days of the week 135 tablet 3    hydrOXYzine (ATARAX) 25 MG tablet Take 1 tablet (25 mg total) by mouth every eight (8) hours as needed. 90 tablet 3    losartan (COZAAR) 50 MG tablet Take 1 tablet (50 mg total) by mouth daily. 90 tablet 3    melatonin 5 mg tablet Take 1 tablet (5 mg total) by mouth nightly.      meloxicam (MOBIC) 15 MG tablet Take 1 tablet (15 mg total) by mouth daily. Take with food. 14 tablet 0    mycophenolate (CELLCEPT) 500 mg tablet Take 2 tablets (1,000 mg total) by mouth two (2) times a day. 540 tablet 1    norethindrone (MICRONOR) 0.35 mg tablet Take 1 tablet by mouth daily. 84 tablet 3    omeprazole (PRILOSEC) 20 MG capsule Take 1 capsule (20 mg total) by mouth daily. 90 capsule 3    oxyCODONE (ROXICODONE) 5 MG immediate release tablet Take 1 tablet (5 mg total) by mouth daily as needed for pain. 30 tablet 0    predniSONE (DELTASONE) 1 MG tablet Take 3 tablets (3 mg total) by mouth daily. 90 tablet 3    tizanidine (ZANAFLEX) 2 MG tablet Take 1 tablet (2 mg total) by mouth every eight (8) hours as needed. 270 tablet 1     No current facility-administered medications for this visit.       Health Maintenance:     Health Maintenance   Topic Date Due    COVID-19 Vaccine (3 - Pfizer risk series) 10/27/2020    Influenza Vaccine (1) 04/12/2023    Pneumococcal Vaccine 0-64 (3 of 3 - PPSV23, PCV20 or PCV21) 10/28/2023 (Originally 08/06/2020)    Serum Creatinine Monitoring  06/01/2024    Potassium Monitoring  06/01/2024    DTaP/Tdap/Td Vaccines (4 - Td or Tdap) 04/09/2025    HPV Cotest with Pap Smear (21-65)  01/30/2028    Pap Smear with Cotest HPV (21-65)  02/04/2028    Hepatitis C Screen  Completed       Immunizations:     Immunization History   Administered Date(s) Administered    COVID-19 VAC,MRNA,TRIS(12Y UP)(PFIZER)(GRAY CAP) 09/08/2020    COVID-19 VACC,MRNA,(PFIZER)(PF) 09/29/2020  DTP 12/08/1990    HPV Quadrivalent (Gardasil) 03/24/2006, 06/01/2006, 10/02/2006    Influenza Vaccine Quad(IM)6 MO-Adult(PF) 06/05/2015, 05/06/2016, 07/07/2017, 07/06/2018, 04/29/2019, 05/17/2020    Influenza Virus Vaccine, unspecified formulation 05/06/2016, 07/07/2017    MMR 12/08/1990    Meningococcal C Conjugate 03/08/2004    Novel Influenza-h1n1-09, All Formulations 07/20/2008    PNEUMOCOCCAL POLYSACCHARIDE 23-VALENT 08/07/2015    PPD Test 05/20/2009 Pneumococcal Conjugate 13-Valent 04/10/2015    SHINGRIX-ZOSTER VACCINE (HZV),RECOMBINANT,ADJUVANTED(IM) 12/04/2021, 07/05/2022    TdaP 02/16/2012, 04/10/2015     I have reviewed and (if needed) updated the patient's problem list, medications, allergies, past medical and surgical history, social and family history.     Vital Signs:     Wt Readings from Last 3 Encounters:   07/15/23 64.9 kg (143 lb)   06/02/23 64.1 kg (141 lb 6.4 oz)   04/07/23 62.1 kg (136 lb 12.8 oz)     Temp Readings from Last 3 Encounters:   07/15/23 36.6 ??C (97.9 ??F)   06/02/23 36.3 ??C (97.3 ??F) (Temporal)   04/07/23 36.2 ??C (97.2 ??F) (Temporal)     BP Readings from Last 3 Encounters:   06/02/23 139/99   04/07/23 122/87   03/25/23 108/66     Pulse Readings from Last 3 Encounters:   06/02/23 79   04/07/23 96   03/25/23 72     Estimated body mass index is 28.87 kg/m?? as calculated from the following:    Height as of 07/15/23: 149.9 cm (4' 11.02).    Weight as of 07/15/23: 64.9 kg (143 lb).  No height and weight on file for this encounter.      Objective:      General: Alert and oriented x3. Well-appearing. No acute distress. ***  HEENT:  Normocephalic.  Atraumatic. Conjunctiva and sclera normal. OP MMM without lesions. ***  Neck:  Supple. No thyroid enlargement. No adenopathy. ***  Heart:  Regular rate and rhythm. Normal S1, S2. No murmurs, rubs or gallops. ***  Lungs:  No respiratory distress.  Lungs clear to auscultation. No wheezes, rhonchi, or rales. ***  GI/GU:  Soft, +BS, nondistended, non-TTP. No palpable masses or organomegaly. ***  Extremities:  No edema. Peripheral pulses normal. ***  Skin:  Warm, dry. No rash or lesions present. ***  Neuro:  Non-focal. No obvious weakness. ***  Psych:  Affect normal, eye contact good, speech clear and coherent. ***       I attest that I, Noralyn Pick, personally documented this note while acting as scribe for Noralyn Pick, FNP.      Noralyn Pick, Scribe.  08/21/2023     The documentation recorded by the scribe accurately reflects the service I personally performed and the decisions made by me.     Noralyn Pick, FNP

## 2023-08-26 DIAGNOSIS — R1319 Other dysphagia: Principal | ICD-10-CM

## 2023-08-26 MED ORDER — OMEPRAZOLE 20 MG CAPSULE,DELAYED RELEASE
ORAL_CAPSULE | Freq: Every day | ORAL | 3 refills | 90.00 days
Start: 2023-08-26 — End: 2024-08-25

## 2023-08-27 DIAGNOSIS — G8929 Other chronic pain: Principal | ICD-10-CM

## 2023-08-27 MED ORDER — OXYCODONE 5 MG TABLET
ORAL_TABLET | Freq: Every day | ORAL | 0 refills | 30.00 days | PRN
Start: 2023-08-27 — End: ?

## 2023-08-27 MED ORDER — LOSARTAN 50 MG TABLET
ORAL_TABLET | Freq: Every day | ORAL | 3 refills | 90 days | Status: CP
Start: 2023-08-27 — End: 2024-08-26

## 2023-08-27 MED ORDER — OMEPRAZOLE 20 MG CAPSULE,DELAYED RELEASE
ORAL_CAPSULE | Freq: Every day | ORAL | 3 refills | 90.00 days | Status: CP
Start: 2023-08-27 — End: 2024-08-26

## 2023-08-27 NOTE — Unmapped (Signed)
Patient is requesting the following refill  Requested Prescriptions     Pending Prescriptions Disp Refills    omeprazole (PRILOSEC) 20 MG capsule 90 capsule 3     Sig: Take 1 capsule (20 mg total) by mouth daily.       Recent Visits  Date Type Provider Dept   08/21/23 Office Visit Loran Senters, FNP [facility] [address] [facility]   02/25/23 Office Visit Loran Senters, FNP [facility] [address] [facility]   10/28/22 Office Visit Loran Senters, FNP [facility] [address] [facility]   Showing recent visits within past 365 days and meeting all other requirements  Future Appointments  Date Type Provider Dept   02/23/24 Appointment Loran Senters, FNP [facility] [address] [facility]   Showing future appointments within next 365 days and meeting all other requirements       Labs: Not applicable this refill

## 2023-08-28 MED ORDER — OXYCODONE 5 MG TABLET
ORAL_TABLET | Freq: Every day | ORAL | 0 refills | 30.00 days | Status: CP | PRN
Start: 2023-08-28 — End: ?

## 2023-08-28 NOTE — Unmapped (Signed)
Oxycodone refill  Last Visit Date: 06/02/2023  Next Visit Date: 09/02/2023    Lab Results   Component Value Date    ALT 23 06/02/2023    AST 25 06/02/2023    ALBUMIN 4.0 06/02/2023    CREATININE 0.89 06/02/2023     Lab Results   Component Value Date    WBC 3.1 (L) 06/02/2023    HGB 12.7 06/02/2023    HCT 38.9 06/02/2023    PLT 225 06/02/2023     Lab Results   Component Value Date    NEUTROPCT 61.6 06/02/2023    LYMPHOPCT 19.0 06/02/2023    MONOPCT 18.5 06/02/2023    EOSPCT 0.3 06/02/2023    BASOPCT 0.6 06/02/2023

## 2023-08-29 NOTE — Unmapped (Unsigned)
Assessment/Plan:   Julia Bates is a 38 y.o. female with history of SLE who returns today for follow up.     ***    1. SLE: Characterized by rash, arthralgias, hypocomplementemia, pericarditis, remote history of nephritis. Today her rash has improved, stable continued joint pain. As time passes I suspect rituximab's effect on joint pain will become more evident and can further taper prednisone.  - Continue Cellcept 1000mg  BID   - Continue prednisone 3mg  daily for now  - s/p Rituxan 1000mg  03/24/2023 and 04/07/2023. Due 09/2023  - Continue Plaquenil 300 mg once daily. Last eye exam 12/2022.  - Continue to follow up with Cardiology for prior episode of pericarditis.   - Update CBCw/diff, dsDNA, C3/C4, UPC, UA    2. Chronic lower back pain:   - Continue tizanidine 2 mg nightly.  - Continue oxycodone 5 mg once daily.  Pt reports inability to taper further.   - She reports plan to establish with local pain management and will discuss taking over oxycodone with them.   - UDS completed 10/21/22  - Continue F/U with Ortho, last visit 11/04/22.     3. H/o osteopenia: Follows with Endocrinology  - Continue daily Vitamin D and Calcium.    4. Right lower chest pain:  Tender at anterior and dorsal rib cage on exam A>D, exacerbated by reaching over her head, no cutaneous changes, no abdominal pain or tenderness. Concern for MSK pain as happened while reaching into the washer at home- given osteopenia, will eval for fracture.  - CXR today without rib fracture  - voltaren gel to right chest as needed  - Toradol shot today in clinic    5. Health maintenance  She would benefit from Flu/COVID vaccination, this should be done 2-4 weeks before her next Rituxin dose ~January.  - she does not currently want flu or COVID shot    F/U as scheduled in July with Dr. Marland Mcalpine     I personally spent *** minutes face-to-face and non-face-to-face in the care of this patient, which includes all pre, intra, and post visit time on the date of service.  All documented time was specific to the E/M visit and does not include any procedures that may have been performed.      History of Present Illness:     Primary Care Provider: Loran Senters, FNP    Chief Complaint: Follow up SLE    HPI:  Julia Bates is a 38 y.o.  female with a h/o SLE who presents for a follow up visit. Patient has a h/o prior nephritis in the distant past; also has rash and joint pain. She was previously on  Benlysta which was stopped given it is unclear if it was helping her. In 2016, patient was in a clinical trial of an anti-CD28 inhibitor and had to be withdrawn from the study due to prolonged healing from cellulitis. She has been treated with cellcept in the past, but this was discontinued in 12/2016 due to pt desire to conceive. Started on imuran in 12/2016. Was unable to go above 50 mg imuran qd due to worsening leukopenia and no longer taking. She also has a hx of chronic lower back pain, for which she has been on oxycodone.     Hospitalized 05/2022 due to progressive, painful, tender rash on medial left lower thigh and lateral left upper thigh. 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 Dermatology and rheumatology were consulted and she was started on prednisone taper and Cellcept. Also started on Eliquis to address the thrombotic element of her presentation.     Saphnelo discontinued 05/2022 due to continued disease activity.     09/2022 pt switched back to cellcept from Myfortic due to GERD.     Last Visit: 06/02/23 with Dr. Marland Mcalpine     Interim history:    MMF?    Pred?    HCQ?  Eye exam?    Rituxan?    Rash?  Oral ulcers?  Changes to urine?    Joints?  Swelling?    Fever?  Illness?  Infections?            Review of records: I have reviewed labs/images/clinic notes per the computerized medical record.     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  One recent report suggests that the sensitivity of IgA detection by DIF is cases of IgA vasculitis is 81% using the EULAR criteria as the gold standard for diagnosis.  The specificity of DIF for the diagnosis of IgA vasculitis is 83%.  Therefore, it is important to use clinical criteria in making the diagnosis as false negatives and false positive results can occur.  This study is consists of a predominantly adult inpatient population.     Skin biopsy 05/21/2022:  Left leg, punch  Diagnosis:  - Intravascular fibrin thrombi with sparse to moderately dense perivascular mixed inflammation with neutrophils and eosinophils and microhemorrhage. See comment.   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  A mixed pattern such as this can be seen in the context of connective tissue disease, levamisole, cryoglobulinemia, drug/medication.  Clinical correlation is recommended.    Allergies:  Sulfa (sulfonamide antibiotics)    Medications:     Current Outpatient Medications:     calcium carbonate-vitamin D3 600 mg(1,500mg ) -200 unit per tablet, Take 1 tablet by mouth Two (2) times a day., Disp: , Rfl:     diclofenac sodium (VOLTAREN) 1 % gel, Apply 2 g topically four (4) times a day., Disp: 100 g, Rfl: 5    DULoxetine (CYMBALTA) 30 MG capsule, Take 1 capsule (30 mg total) by mouth nightly., Disp: 30 capsule, Rfl: 2    ergocalciferol, vitamin D2, (VITAMIN D2 ORAL), Take 1,000 Units by mouth daily with evening meal., Disp: , Rfl:     hydroxychloroquine (PLAQUENIL) 200 mg tablet, Take 1.5 tablets (300 mg total) by mouth daily. If unable to tolerate the smell of splitting tab - can take 200mg  on MWF, 400mg  other days of the week, Disp: 135 tablet, Rfl: 3    hydrOXYzine (ATARAX) 25 MG tablet, Take 1 tablet (25 mg total) by mouth every eight (8) hours as needed., Disp: 90 tablet, Rfl: 3    losartan (COZAAR) 50 MG tablet, Take 1 tablet (50 mg total) by mouth daily., Disp: 90 tablet, Rfl: 3    melatonin 5 mg tablet, Take 1 tablet (5 mg total) by mouth nightly., Disp: , Rfl:     meloxicam (MOBIC) 15 MG tablet, Take 1 tablet (15 mg total)  by mouth daily. Take with food., Disp: 14 tablet, Rfl: 0    mycophenolate (CELLCEPT) 500 mg tablet, Take 2 tablets (1,000 mg total) by mouth two (2) times a day., Disp: 540 tablet, Rfl: 1    norethindrone (MICRONOR) 0.35 mg tablet, Take 1 tablet by mouth daily., Disp: 84 tablet, Rfl: 3    omeprazole (PRILOSEC) 20 MG capsule, Take 1 capsule (20 mg total) by mouth daily., Disp: 90 capsule, Rfl: 3    oxyCODONE (ROXICODONE) 5 MG immediate release tablet, Take 1 tablet (5 mg total) by mouth daily as needed for pain., Disp: 30 tablet, Rfl: 0    predniSONE (DELTASONE) 1 MG tablet, Take 3 tablets (3 mg total) by mouth daily., Disp: 90 tablet, Rfl: 3    tizanidine (ZANAFLEX) 2 MG tablet, Take 1 tablet (2 mg total) by mouth every eight (8) hours as needed., Disp: 270 tablet, Rfl: 1    traZODone (DESYREL) 50 MG tablet, Take 1 tablet (50 mg total) by mouth nightly. Take 1/2 tablet (25 mg) nightly for 7-10 days then increase to 50 mg (1 whole tablet)., Disp: 90 tablet, Rfl: 1    Medical History:  Past Medical History:   Diagnosis Date    Abnormal Pap smear of cervix     Allergic     Sulfa    Cataract     Deep vein thrombosis (CMS-HCC) May 24 2022    Dysplasia of cervix, 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    ECC pos CIN 1--12/19    Essential hypertension 09/23/2022    GERD (gastroesophageal reflux disease) Oct 23    HPV (human papilloma virus) infection     Lupus     Lupus     Osteoporosis     Rheumatoid arthritis (CMS-HCC) 11/18/2015    SLE (systemic lupus erythematosus related syndrome) (CMS-HCC)        Surgical History:  Past Surgical History:   Procedure Laterality Date    CERVICAL BIOPSY  W/ LOOP ELECTRODE EXCISION      COLPOSCOPY      SKIN BIOPSY  2013-2014       Social History:  Social History     Tobacco Use    Smoking status: Never     Passive exposure: Never    Smokeless tobacco: Never    Tobacco comments:     None   Vaping Use    Vaping status: Never Used   Substance Use Topics    Alcohol use: Never    Drug use: Never       Family History:  Family History   Problem Relation Age of Onset    Glaucoma Father     Diabetes Father     Hypertension Father     Heart attack Father     Neuropathy Mother     Osteoporosis Mother     No Known Problems Sister     Diabetes Sister     Diabetes Sister     Melanoma Neg Hx     Basal cell carcinoma Neg Hx     Squamous cell carcinoma Neg Hx     Macular degeneration Neg Hx     Blindness Neg Hx        Objective   There were no vitals filed for this visit.      Physical Exam:***  Constitutional:   Patient is well appearing, and does not appear to be in any acute distress   Eyes:   PERRLA, EOMI, and sclera aniteric. No scleral  injection, no discharge, no allergic shiners.   ENT:   Moist, mucous membranes. Oropharhynx without any erythema or exudate. No sinus tenderness, nasal ulcerations, or oropharyngeal exudates or ulcerations.      Lymphatic  No cervical lymphadenopathy   Cardiovascular:  Normal rate, regular rhythm.  S1 and S2 normal, without any murmur, rub, or gallop. No lower extremity edema.  Warm and well perfused.    Respiratory:  Clear to auscultation bilaterally, without wheezes, crackles, or rhonchi. Good air movement. Normal work of breathing.   Skin:    No rashes.     Psychiatry:   Alert and oriented to person, place, and time. Appropriate affect. Mood and affect appropriate and congruent.   Musculo Skeletal:   No tender or swollen joints in the hands. Able to make tight fist b/l. Good ROM of b/l wrists w/o swelling or tenderness.   Good ROM of b/l shoulders   Good ROM of b/l knees w/o swelling  TTP anterior and posterior inferior rib margin, exacerbated with raising arms over her head.   Neurological:  Alert and oriented to person, place and time.            Test Results  No visits with results within 4 Week(s) from this visit.   Latest known visit with results is:   Office Visit on 06/02/2023   Component Date Value    C3 Complement 06/02/2023 123     C4 Complement 06/02/2023 18.8     dsDNA Ab 06/02/2023 Negative     Creat U 06/02/2023 163.1     Protein, Ur 06/02/2023 40.2     Protein/Creatinine Ratio* 06/02/2023 0.246     Sodium 06/02/2023 145     Potassium 06/02/2023 3.6     Chloride 06/02/2023 111 (H)     CO2 06/02/2023 26.1     Anion Gap 06/02/2023 8     BUN 06/02/2023 10     Creatinine 06/02/2023 0.89     BUN/Creatinine Ratio 06/02/2023 11     eGFR CKD-EPI (2021) Fema* 06/02/2023 86     Glucose 06/02/2023 92     Calcium 06/02/2023 9.4     Albumin 06/02/2023 4.0     Total Protein 06/02/2023 6.6     Total Bilirubin 06/02/2023 0.5     AST 06/02/2023 25     ALT 06/02/2023 23     Alkaline Phosphatase 06/02/2023 112     Color, UA 06/02/2023 Light Yellow     Clarity, UA 06/02/2023 Clear     Specific Gravity, UA 06/02/2023 1.020     pH, UA 06/02/2023 6.0     Leukocyte Esterase, UA 06/02/2023 Negative     Nitrite, UA 06/02/2023 Negative     Protein, UA 06/02/2023 Trace (A)     Glucose, UA 06/02/2023 Negative     Ketones, UA 06/02/2023 Negative     Urobilinogen, UA 06/02/2023 <2.0 mg/dL     Bilirubin, UA 16/05/9603 Negative     Blood, UA 06/02/2023 Negative     RBC, UA 06/02/2023 1     WBC, UA 06/02/2023 1     Squam Epithel, UA 06/02/2023 2     Bacteria, UA 06/02/2023 Rare (A)     WBC 06/02/2023 3.1 (L)     RBC 06/02/2023 4.63     HGB 06/02/2023 12.7     HCT 06/02/2023 38.9     MCV 06/02/2023 84.0     MCH 06/02/2023 27.4     MCHC 06/02/2023 32.6     RDW  06/02/2023 13.3     MPV 06/02/2023 8.1     Platelet 06/02/2023 225 Neutrophils % 06/02/2023 61.6     Lymphocytes % 06/02/2023 19.0     Monocytes % 06/02/2023 18.5     Eosinophils % 06/02/2023 0.3     Basophils % 06/02/2023 0.6     Absolute Neutrophils 06/02/2023 1.9     Absolute Lymphocytes 06/02/2023 0.6 (L)     Absolute Monocytes 06/02/2023 0.6     Absolute Eosinophils 06/02/2023 0.0     Absolute Basophils 06/02/2023 0.0     Urine Culture, Comprehen* 06/02/2023 <10,000 CFU/mL

## 2023-09-04 DIAGNOSIS — M3219 Other organ or system involvement in systemic lupus erythematosus: Principal | ICD-10-CM

## 2023-09-04 MED ORDER — PREDNISONE 1 MG TABLET
ORAL_TABLET | Freq: Every day | ORAL | 1 refills | 30.00 days | Status: CP
Start: 2023-09-04 — End: ?

## 2023-09-04 NOTE — Unmapped (Signed)
Prednisone refill  Last Visit Date: 06/02/2023  Next Visit Date: 09/17/2023

## 2023-09-10 DIAGNOSIS — M3219 Other organ or system involvement in systemic lupus erythematosus: Principal | ICD-10-CM

## 2023-09-10 MED ORDER — MYCOPHENOLATE MOFETIL 500 MG TABLET
ORAL_TABLET | Freq: Two times a day (BID) | ORAL | 0 refills | 30.00 days | Status: CP
Start: 2023-09-10 — End: ?
  Filled 2023-09-18: qty 120, 30d supply, fill #0

## 2023-09-10 NOTE — Unmapped (Signed)
[facility] Refill Coordination Note    Julia Bates, [place]: March 23, 1986  Phone: [phone] (home)       All above HIPAA information was verified with patient.         09/10/2023    11:39 AM   Specialty Rx Medication Refill Questionnaire   Which Medications would you like refilled and shipped? Cellcept   If medication refills are not needed at this time, please indicate the reason below. I have refills on the rest   Please list all current allergies: Dulfa   Have you missed any doses in the last 30 days? No   Have you had any changes to your medication(s) since your last refill? No   How many days remaining of each medication do you have at home? 1 eeek   Have you experienced any side effects in the last 30 days? No   Please enter the full address (street address, city, state, zip code) where you would like your medication(s) to be delivered to. [address] , [place] [address]   Please specify on which day you would like your medication(s) to arrive. Note: if you need your medication(s) within 3 days, please call the pharmacy to schedule your order at [phone]  09/17/2023   Has your insurance changed since your last refill? No   Would you like a pharmacist to call you to discuss your medication(s)? No   Do you require a signature for your package? (Note: if we are billing Medicare Part B or your order contains a controlled substance, we will require a signature) No         Completed refill call assessment today to schedule patient's medication shipment from the [facility] [phone]).  All relevant notes have been reviewed.       Confirmed patient received a Conservation officer, historic buildings and a Surveyor, mining with first shipment. The patient will receive a drug information handout for each medication shipped and additional FDA Medication Guides as required.         REFERRAL TO PHARMACIST     Referral to the pharmacist: Not needed      [facility] Shipping address confirmed in Epic.     Delivery Scheduled: Yes, Expected medication delivery date: 09/17/23.     Medication will be delivered via Same Day Courier to the prescription address in Epic WAM.    Julia [facility]   [facility] Specialty Technician

## 2023-09-10 NOTE — Unmapped (Signed)
Mycophenolate     Last Visit Date: 06/02/2023  Next Visit Date: 09/17/2023    Lab Results   Component Value Date    ALT 23 06/02/2023    AST 25 06/02/2023    ALBUMIN 4.0 06/02/2023    CREATININE 0.89 06/02/2023     Lab Results   Component Value Date    WBC 3.1 (L) 06/02/2023    HGB 12.7 06/02/2023    HCT 38.9 06/02/2023    PLT 225 06/02/2023     Lab Results   Component Value Date    NEUTROPCT 61.6 06/02/2023    LYMPHOPCT 19.0 06/02/2023    MONOPCT 18.5 06/02/2023    EOSPCT 0.3 06/02/2023    BASOPCT 0.6 06/02/2023

## 2023-09-15 NOTE — Unmapped (Signed)
Assessment/Plan:   Julia Bates is a 38 y.o. female with history of SLE who returns today for follow up.     1. SLE: Characterized by rash, arthralgias, hypocomplementemia, pericarditis, remote history of nephritis. Today she reports overall stable symptoms. Previous rash has resolved but joint pain continue.   - Continue Cellcept 1000mg  BID   - Decrease prednisone to 2mg  daily  - s/p Rituxan 1000mg  03/24/2023 and 04/07/2023. Discussed this with Dr. Marland Mcalpine- she recommends 1gmx2 09/2023.   - Continue Plaquenil 300 mg once daily. Last eye exam 12/2022.  - Continue to follow up with Cardiology for prior episode of pericarditis.   - Update CBCw/diff, Cr, AST, ALT, dsDNA, C3/C4, UPC, UA    2. Chronic lower back pain:   - Continue tizanidine 2 mg nightly.  - Continue oxycodone 5 mg once daily.  Pt reports inability to taper further.   - Update UDS today.  - Continue F/U with PM&R    3. H/o osteopenia: Follows with Endocrinology  - Continue daily Vitamin D and Calcium.    4. Tremor: developed tremor to b/l hands in last few months   - Referral to [facility].     5. Health maintenance  She would benefit from Flu/COVID vaccination, this should be done 2-4 weeks before her next Rituxin dose ~January.  - she does not currently want flu or COVID shot    F/U as scheduled in July with Dr. Marland Mcalpine     I personally spent 40 minutes face-to-face and non-face-to-face in the care of this patient, which includes all pre, intra, and post visit time on the date of service.  All documented time was specific to the E/M visit and does not include any procedures that may have been performed.      History of Present Illness:     Primary Care Provider: Loran Senters, FNP    Chief Complaint: Follow up SLE    HPI:  Julia Bates is a 38 y.o.  female with a h/o SLE who presents for a follow up visit. Patient has a h/o prior nephritis in the distant past; also has rash and joint pain. She was previously on  Benlysta which was stopped given it is unclear if it was helping her. In 2016, patient was in a clinical trial of an anti-CD28 inhibitor and had to be withdrawn from the study due to prolonged healing from cellulitis. She has been treated with cellcept in the past, but this was discontinued in 12/2016 due to pt desire to conceive. Started on imuran in 12/2016. Was unable to go above 50 mg imuran qd due to worsening leukopenia and no longer taking. She also has a hx of chronic lower back pain, for which she has been on oxycodone.     Hospitalized 05/2022 due to progressive, painful, tender rash on medial left lower thigh and lateral left upper thigh. 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 Dermatology and rheumatology were consulted and she was started on prednisone taper and Cellcept. Also started on Eliquis to address the thrombotic element of her presentation.     Saphnelo discontinued 05/2022 due to continued disease activity.     09/2022 pt switched back to cellcept from Myfortic due to GERD.     Last Visit: 06/02/23 with Dr. Marland Mcalpine     Interim history:  Pt presents for f/u today. She is doing  OK overall. She noticed a tremor to her hand over the last few months which seems to be worsening. She reports this to both hands but R is worse. She is taking MMF, pred, and HCQ. She had last rituxan in August and tolerated overall. She continues to have pain though. She is unsure if rituxan helped much. She reports pain mostly to knees and fingers. She reports AM stiffness lasting 30 min. She had some bruises the other day but since resolved. No other rashes. No recent oral ulcers. No recent bloody or frothy urine. No recent fever, illness or infections. She saw pain management locally but they don't prescribe narcotics. She hasn't been able to taper oxycodone below one tablet daily.     Review of records: I have reviewed labs/images/clinic notes per the computerized medical record.     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  One recent report suggests that the sensitivity of IgA detection by DIF is cases of IgA vasculitis is 81% using the EULAR criteria as the gold standard for diagnosis.  The specificity of DIF for the diagnosis of IgA vasculitis is 83%.  Therefore, it is important to use clinical criteria in making the diagnosis as false negatives and false positive results can occur.  This study is consists of a predominantly adult inpatient population.     Skin biopsy 05/21/2022:  Left leg, punch  Diagnosis:  - Intravascular fibrin thrombi with sparse to moderately dense perivascular mixed inflammation with neutrophils and eosinophils and microhemorrhage. See comment.   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  A mixed pattern such as this can be seen in the context of connective tissue disease, levamisole, cryoglobulinemia, drug/medication.  Clinical correlation is recommended.    Allergies:  Sulfa (sulfonamide antibiotics)    Medications:     Current Outpatient Medications:     calcium carbonate-vitamin D3 600 mg(1,500mg ) -200 unit per tablet, Take 1 tablet by mouth Two (2) times a day., Disp: , Rfl:     diclofenac sodium (VOLTAREN) 1 % gel, Apply 2 g topically four (4) times a day., Disp: 100 g, Rfl: 5    DULoxetine (CYMBALTA) 30 MG capsule, Take 1 capsule (30 mg total) by mouth nightly., Disp: 30 capsule, Rfl: 2    ergocalciferol, vitamin D2, (VITAMIN D2 ORAL), Take 1,000 Units by mouth daily with evening meal., Disp: , Rfl:     hydroxychloroquine (PLAQUENIL) 200 mg tablet, Take 1.5 tablets (300 mg total) by mouth daily. If unable to tolerate the smell of splitting tab - can take 200mg  on MWF, 400mg  other days of the week, Disp: 135 tablet, Rfl: 3    hydrOXYzine (ATARAX) 25 MG tablet, Take 1 tablet (25 mg total) by mouth every eight (8) hours as needed., Disp: 90 tablet, Rfl: 3    losartan (COZAAR) 50 MG tablet, Take 1 tablet (50 mg total) by mouth daily., Disp: 90 tablet, Rfl: 3    melatonin 5 mg tablet, Take 1 tablet (5 mg total) by mouth nightly., Disp: , Rfl:     mycophenolate (CELLCEPT) 500 mg tablet, Take 2 tablets (1,000 mg total) by mouth two (  2) times a day., Disp: 120 tablet, Rfl: 0    norethindrone (MICRONOR) 0.35 mg tablet, Take 1 tablet by mouth daily., Disp: 84 tablet, Rfl: 3    omeprazole (PRILOSEC) 20 MG capsule, Take 1 capsule (20 mg total) by mouth daily., Disp: 90 capsule, Rfl: 3    oxyCODONE (ROXICODONE) 5 MG immediate release tablet, Take 1 tablet (5 mg total) by mouth daily as needed for pain., Disp: 30 tablet, Rfl: 0    predniSONE (DELTASONE) 1 MG tablet, Take 3 tablets (3 mg total) by mouth daily., Disp: 90 tablet, Rfl: 1    tizanidine (ZANAFLEX) 2 MG tablet, Take 1 tablet (2 mg total) by mouth every eight (8) hours as needed., Disp: 270 tablet, Rfl: 1    traZODone (DESYREL) 50 MG tablet, Take 1 tablet (50 mg total) by mouth nightly. Take 1/2 tablet (25 mg) nightly for 7-10 days then increase to 50 mg (1 whole tablet)., Disp: 90 tablet, Rfl: 1    Medical History:  Past Medical History:   Diagnosis Date    Abnormal Pap smear of cervix     Allergic     Sulfa    Cataract     Deep vein thrombosis (CMS-HCC) May 24 2022    Dysplasia of cervix, 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    ECC pos CIN 1--12/19    Essential hypertension 09/23/2022    GERD (gastroesophageal reflux disease) Oct 23    HPV (human papilloma virus) infection     Lupus     Lupus     Osteoporosis     Rheumatoid arthritis (CMS-HCC) 11/18/2015    SLE (systemic lupus erythematosus related syndrome) (CMS-HCC)        Surgical History:  Past Surgical History:   Procedure Laterality Date    CERVICAL BIOPSY  W/ LOOP ELECTRODE EXCISION      COLPOSCOPY      SKIN BIOPSY  2013-2014       Social History:  Social History     Tobacco Use    Smoking status: Never     Passive exposure: Never    Smokeless tobacco: Never    Tobacco comments:     None   Vaping Use    Vaping status: Never Used   Substance Use Topics    Alcohol use: Never    Drug use: Never       Family History:  Family History   Problem Relation Age of Onset    Glaucoma Father     Diabetes Father     Hypertension Father     Heart attack Father     Neuropathy Mother     Osteoporosis Mother     No Known Problems Sister     Diabetes Sister     Diabetes Sister     Melanoma Neg Hx     Basal cell carcinoma Neg Hx     Squamous cell carcinoma Neg Hx     Macular degeneration Neg Hx     Blindness Neg Hx        Objective   Vitals:    09/17/23 1029   BP: 102/76   BP Position: Sitting   Pulse: 86   Temp: 36.7 ??C (98.1 ??F)   TempSrc: Temporal   Weight: 64.9 kg (143 lb)         Physical Exam:  Constitutional:   Patient is well appearing, and does not appear to be in any acute distress   Eyes:   PERRLA, EOMI, and  sclera aniteric. No scleral injection, no discharge, no allergic shiners.   ENT:   Moist, mucous membranes. Oropharhynx without any erythema or exudate. No sinus tenderness, nasal ulcerations, or oropharyngeal exudates or ulcerations.      Lymphatic  No cervical lymphadenopathy   Cardiovascular:  Normal rate, regular rhythm.  S1 and S2 normal, without any murmur, rub, or gallop. No lower extremity edema.  Warm and well perfused.    Respiratory:  Clear to auscultation bilaterally, without wheezes, crackles, or rhonchi. Good air movement. Normal work of breathing.   Skin:    No rashes.     Psychiatry:   Alert and oriented to person, place, and time. Appropriate affect. Mood and affect appropriate and congruent.   Musculo Skeletal:   No tender or swollen joints in the hands. Able to make tight fist b/l. Good ROM of b/l wrists w/o swelling or tenderness.   Good ROM of b/l shoulders   Good ROM of b/l knees w/o swelling   Neurological:  Alert and oriented to person, place and time. Essential tremor to R hand. Strength intact.            Test Results  No visits with results within 4 Week(s) from this visit.   Latest known visit with results is:   Office Visit on 06/02/2023   Component Date Value    C3 Complement 06/02/2023 123     C4 Complement 06/02/2023 18.8     dsDNA Ab 06/02/2023 Negative     Creat U 06/02/2023 163.1     Protein, Ur 06/02/2023 40.2     Protein/Creatinine Ratio* 06/02/2023 0.246     Sodium 06/02/2023 145     Potassium 06/02/2023 3.6     Chloride 06/02/2023 111 (H)     CO2 06/02/2023 26.1     Anion Gap 06/02/2023 8     BUN 06/02/2023 10     Creatinine 06/02/2023 0.89     BUN/Creatinine Ratio 06/02/2023 11     eGFR CKD-EPI (2021) Fema* 06/02/2023 86     Glucose 06/02/2023 92     Calcium 06/02/2023 9.4     Albumin 06/02/2023 4.0     Total Protein 06/02/2023 6.6     Total Bilirubin 06/02/2023 0.5     AST 06/02/2023 25     ALT 06/02/2023 23     Alkaline Phosphatase 06/02/2023 112     Color, UA 06/02/2023 Light Yellow     Clarity, UA 06/02/2023 Clear     Specific Gravity, UA 06/02/2023 1.020     pH, UA 06/02/2023 6.0     Leukocyte Esterase, UA 06/02/2023 Negative     Nitrite, UA 06/02/2023 Negative     Protein, UA 06/02/2023 Trace (A)     Glucose, UA 06/02/2023 Negative     Ketones, UA 06/02/2023 Negative     Urobilinogen, UA 06/02/2023 <2.0 mg/dL     Bilirubin, UA 40/05/2724 Negative     Blood, UA 06/02/2023 Negative     RBC, UA 06/02/2023 1     WBC, UA 06/02/2023 1     Squam Epithel, UA 06/02/2023 2     Bacteria, UA 06/02/2023 Rare (A)     WBC 06/02/2023 3.1 (L)     RBC 06/02/2023 4.63     HGB 06/02/2023 12.7     HCT 06/02/2023 38.9     MCV 06/02/2023 84.0     MCH 06/02/2023 27.4     MCHC 06/02/2023 32.6     RDW 06/02/2023 13.3  MPV 06/02/2023 8.1     Platelet 06/02/2023 225     Neutrophils % 06/02/2023 61.6     Lymphocytes % 06/02/2023 19.0     Monocytes % 06/02/2023 18.5     Eosinophils % 06/02/2023 0.3     Basophils % 06/02/2023 0.6     Absolute Neutrophils 06/02/2023 1.9     Absolute Lymphocytes 06/02/2023 0.6 (L)     Absolute Monocytes 06/02/2023 0.6     Absolute Eosinophils 06/02/2023 0.0     Absolute Basophils 06/02/2023 0.0     Urine Culture, Comprehen* 06/02/2023 <10,000 CFU/mL

## 2023-09-17 ENCOUNTER — Ambulatory Visit: Admit: 2023-09-17 | Discharge: 2023-09-17 | Payer: Vision Other Private Insurance

## 2023-09-17 ENCOUNTER — Ambulatory Visit: Admit: 2023-09-17 | Discharge: 2023-09-17 | Payer: Vision Other Private Insurance | Attending: Family | Primary: Family

## 2023-09-17 DIAGNOSIS — G8929 Other chronic pain: Principal | ICD-10-CM

## 2023-09-17 DIAGNOSIS — Z13220 Encounter for screening for lipoid disorders: Principal | ICD-10-CM

## 2023-09-17 DIAGNOSIS — R251 Tremor, unspecified: Principal | ICD-10-CM

## 2023-09-17 DIAGNOSIS — Z79899 Other long term (current) drug therapy: Principal | ICD-10-CM

## 2023-09-17 DIAGNOSIS — M329 Systemic lupus erythematosus, unspecified: Principal | ICD-10-CM

## 2023-09-17 LAB — CBC W/ AUTO DIFF
BASOPHILS ABSOLUTE COUNT: 0 10*9/L (ref 0.0–0.1)
BASOPHILS RELATIVE PERCENT: 0.4 %
EOSINOPHILS ABSOLUTE COUNT: 0 10*9/L (ref 0.0–0.5)
EOSINOPHILS RELATIVE PERCENT: 0.4 %
HEMATOCRIT: 38.6 % (ref 34.0–44.0)
HEMOGLOBIN: 12.9 g/dL (ref 11.3–14.9)
LYMPHOCYTES ABSOLUTE COUNT: 0.8 10*9/L — ABNORMAL LOW (ref 1.1–3.6)
LYMPHOCYTES RELATIVE PERCENT: 11.4 %
MEAN CORPUSCULAR HEMOGLOBIN CONC: 33.4 g/dL (ref 32.0–36.0)
MEAN CORPUSCULAR HEMOGLOBIN: 27.8 pg (ref 25.9–32.4)
MEAN CORPUSCULAR VOLUME: 83.1 fL (ref 77.6–95.7)
MEAN PLATELET VOLUME: 7.4 fL (ref 6.8–10.7)
MONOCYTES ABSOLUTE COUNT: 0.8 10*9/L (ref 0.3–0.8)
MONOCYTES RELATIVE PERCENT: 11.1 %
NEUTROPHILS ABSOLUTE COUNT: 5.4 10*9/L (ref 1.8–7.8)
NEUTROPHILS RELATIVE PERCENT: 76.7 %
PLATELET COUNT: 249 10*9/L (ref 150–450)
RED BLOOD CELL COUNT: 4.64 10*12/L (ref 3.95–5.13)
RED CELL DISTRIBUTION WIDTH: 14.3 % (ref 12.2–15.2)
WBC ADJUSTED: 7 10*9/L (ref 3.6–11.2)

## 2023-09-17 LAB — URINALYSIS WITH MICROSCOPY WITH CULTURE REFLEX PERFORMABLE
BILIRUBIN UA: NEGATIVE
BLOOD UA: NEGATIVE
GLUCOSE UA: NEGATIVE
KETONES UA: NEGATIVE
LEUKOCYTE ESTERASE UA: NEGATIVE
NITRITE UA: NEGATIVE
PH UA: 6 (ref 5.0–9.0)
PROTEIN UA: 30 — AB
RBC UA: <1 /HPF (ref <=4)
SPECIFIC GRAVITY UA: 1.021 (ref 1.003–1.030)
SQUAMOUS EPITHELIAL: 1 /HPF (ref 0–5)
UROBILINOGEN UA: <2
WBC UA: 1 /HPF (ref 0–5)

## 2023-09-17 LAB — C4 COMPLEMENT: C4 COMPLEMENT: 18.6 mg/dL (ref 12.0–36.0)

## 2023-09-17 LAB — LIPID PANEL
CHOLESTEROL/HDL RATIO SCREEN: 3.2 (ref 1.0–4.5)
CHOLESTEROL: 146 mg/dL (ref <=200)
HDL CHOLESTEROL: 46 mg/dL (ref 40–60)
LDL CHOLESTEROL CALCULATED: 81 mg/dL (ref 40–99)
NON-HDL CHOLESTEROL: 100 mg/dL (ref 70–130)
TRIGLYCERIDES: 93 mg/dL (ref 0–150)
VLDL CHOLESTEROL CAL: 18.6 mg/dL (ref 8–32)

## 2023-09-17 LAB — TOXICOLOGY SCREEN, URINE
AMPHETAMINE SCREEN URINE: NEGATIVE
BARBITURATE SCREEN URINE: NEGATIVE
BENZODIAZEPINE SCREEN, URINE: NEGATIVE
BUPRENORPHINE, URINE SCREEN: NEGATIVE
CANNABINOID SCREEN URINE: NEGATIVE
COCAINE(METAB.)SCREEN, URINE: NEGATIVE
METHADONE SCREEN, URINE: NEGATIVE
OPIATE SCREEN URINE: NEGATIVE
OXYCODONE SCREEN URINE: POSITIVE — AB

## 2023-09-17 LAB — C3 COMPLEMENT: C3 COMPLEMENT: 148 mg/dL (ref 90–170)

## 2023-09-17 LAB — AST: AST (SGOT): 24 U/L (ref <=34)

## 2023-09-17 LAB — CREATININE
CREATININE: 0.9 mg/dL (ref 0.55–1.02)
EGFR CKD-EPI (2021) FEMALE: 85 mL/min/{1.73_m2} (ref >=60)

## 2023-09-17 LAB — PROTEIN / CREATININE RATIO, URINE
CREATININE, URINE: 155.4 mg/dL
PROTEIN URINE: 40.8 mg/dL
PROTEIN/CREAT RATIO, URINE: 0.263

## 2023-09-17 LAB — ALT: ALT (SGPT): 17 U/L (ref 10–49)

## 2023-09-17 NOTE — Unmapped (Signed)
[facility] 's mycophenolate 500 mg tablet (CELLCEPT) shipment will be sent out as a result of the medication is too soon to refill until 09/18/23.     I have spoken with the patient  at [phone]  and communicated the delivery change. We will reschedule the medication for the delivery date that the patient agreed upon.  We have confirmed the delivery date as 09/18/23, via same day courier.

## 2023-09-17 NOTE — Unmapped (Signed)
[facility] 's Mycophenolate shipment will be delayed as a result of the medication is too soon to refill until 09/18/2023.     I have reached out to the patient  at [phone]  and left a voicemail message.  We will wait for a call back from the patient to reschedule the delivery.  We have not confirmed the new delivery date.

## 2023-09-22 DIAGNOSIS — M328 Other forms of systemic lupus erythematosus: Principal | ICD-10-CM

## 2023-09-22 DIAGNOSIS — R7401 Transaminitis: Principal | ICD-10-CM

## 2023-09-23 LAB — OPIATE, URINE, QUANTITATIVE
6-MONOACETYLMRPH: <5 ng/mL (ref <5)
BUPRENORPHINE: <5 ng/mL (ref <5)
CODEINE GC/MS CONF: <25 ng/mL (ref <25)
FENTANYL, URINE GC/MS: <0.5 ng/mL (ref <0.5)
HYDROCODONE GC/MS CONF: <25 ng/mL (ref <25)
HYDROMORPHONE GC/MS CONF: <25 ng/mL (ref <25)
MORPHINE GC/MS CONF: <25 ng/mL (ref <25)
NORBUPRENORPHINE: <5 ng/mL (ref <5)
NORFENTANYL, UR GC/MS: <1 ng/mL (ref <1.0)
OPIATE INTERP: POSITIVE
OXYCODONE (GC/MS): 1392 ng/mL — ABNORMAL HIGH (ref <25)
OXYMORPHONE: 182 ng/mL — ABNORMAL HIGH (ref <25)

## 2023-09-24 LAB — ANTI-DNA ANTIBODY, DOUBLE-STRANDED: DSDNA ANTIBODY: NEGATIVE

## 2023-09-27 DIAGNOSIS — G8929 Other chronic pain: Principal | ICD-10-CM

## 2023-09-27 MED ORDER — OXYCODONE 5 MG TABLET
ORAL_TABLET | Freq: Every day | ORAL | 0 refills | 30.00 days | PRN
Start: 2023-09-27 — End: ?

## 2023-09-28 MED ORDER — OXYCODONE 5 MG TABLET
ORAL_TABLET | Freq: Every day | ORAL | 0 refills | 30.00 days | Status: CP | PRN
Start: 2023-09-28 — End: ?

## 2023-09-28 NOTE — Unmapped (Signed)
 Oxycodone refill  Last Visit Date: 09/17/2023  Next Visit Date: 02/09/2024    Lab Results   Component Value Date    ALT 17 09/17/2023    AST 24 09/17/2023    ALBUMIN 4.0 06/02/2023    CREATININE 0.90 09/17/2023     Lab Results   Component Value Date    WBC 7.0 09/17/2023    HGB 12.9 09/17/2023    HCT 38.6 09/17/2023    PLT 249 09/17/2023     Lab Results   Component Value Date    NEUTROPCT 76.7 09/17/2023    LYMPHOPCT 11.4 09/17/2023    MONOPCT 11.1 09/17/2023    EOSPCT 0.4 09/17/2023    BASOPCT 0.4 09/17/2023

## 2023-10-05 NOTE — Unmapped (Signed)
 Assessment and Plan:     Stuffy and runny nose  To prevent dehydration, drink plenty of fluids. Choose water and other clear liquids until you feel better.   Get plenty of rest.  Use saline (saltwater) nasal washes to help keep your nasal passages open and wash out mucus and allergens. You can buy saline nose sprays at a grocery store or drugstore. Follow the instructions on the package. Or you can make your own at home. Add 1 teaspoon of non-iodized salt and 1 teaspoon of baking soda to 2 cups of distilled or boiled and cooled water. Fill a squeeze bottle or neti pot with the nasal wash. Then put the tip into your nostril, and lean over the sink. With your mouth open, gently squirt the liquid. Repeat on the other side.  Use a vaporizer or humidifier to add moisture to your bedroom. Follow the instructions for cleaning the machine.  Will trial Atrovent nasal spray for one week. Counseled on medication dosing instructions, potential side effects.   - ipratropium (ATROVENT) 42 mcg (0.06 %) nasal spray; 2 sprays into each nostril Three (3) times a day. Use for 7-10 days.     Reviewed return precautions.     I personally spent 23 minutes face-to-face and non-face-to-face in the care of this patient, which includes all pre, intra, and post visit time on the date of service.    Return if symptoms worsen or fail to improve.    HPI:      Julia Bates is here for   Chief Complaint   Patient presents with    Stuffy nose     Stuffy nose x 1 week.  Worse inside the right nostril.  Sometimes bloody drainage.  Does not feel sick.    Flu Vaccine     Patient declines.       URI/Cough/Congestion: Patient presents with nasal congestion for 1 week. Nasal drainage is minimal, occasional rhinorrhea. There isn't any facial pressure. Cough is not present. Symptoms are generally worse overnight. Patient denies cough, sinus pressure, sinus congestion, post-nasal drainage, sore throat, headache, ear pain, fatigue, chills, fever, body aches, SOB, and wheezing. For relief, patient has tried Vics vapor rub around the nose. Rheumatologist recommend saline nasal spray, little relief. Has not tried oral otc medication such as antihistamine or decongestant.   Recent travel: no. Sick contacts: none. No hx of seasonal allergies.        ROS:      Comprehensive 10 point ROS negative unless otherwise stated in the HPI.      [facility] Components:     Medication adherence and barriers to the treatment plan have been addressed. Opportunities to optimize healthy behaviors have been discussed. Patient / caregiver voiced understanding.    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    ECC pos CIN 1--12/19    Essential hypertension 09/23/2022    GERD (gastroesophageal reflux disease) Oct 23    HPV (human papilloma virus) infection     Lupus     Lupus     Osteoporosis     Rheumatoid arthritis (CMS-HCC) 11/18/2015    SLE (systemic lupus erythematosus related syndrome) (CMS-HCC)  Past Surgical History:   Procedure Laterality Date    CERVICAL BIOPSY  W/ LOOP ELECTRODE EXCISION      COLPOSCOPY      SKIN BIOPSY  2013-2014       Family History:     Family History   Problem Relation Age of Onset    Glaucoma Father     Diabetes Father     Hypertension Father     Heart attack Father     Neuropathy Mother     Osteoporosis Mother     No Known Problems Sister     Diabetes Sister     Diabetes Sister     Melanoma Neg Hx     Basal cell carcinoma Neg Hx     Squamous cell carcinoma Neg Hx     Macular degeneration Neg Hx     Blindness Neg Hx        Social History:     Social History     Tobacco Use    Smoking status: Never     Passive exposure: Never    Smokeless tobacco: Never    Tobacco comments:     None   Vaping Use    Vaping status: Never Used   Substance Use Topics    Alcohol use: Never    Drug use: Never       Allergies:     Sulfa (sulfonamide antibiotics)    Current Medications:     Current Outpatient Medications   Medication Sig Dispense Refill    calcium carbonate-vitamin D3 600 mg(1,500mg ) -200 unit per tablet Take 1 tablet by mouth Two (2) times a day.      diclofenac sodium (VOLTAREN) 1 % gel Apply 2 g topically four (4) times a day. 100 g 5    DULoxetine (CYMBALTA) 30 MG capsule Take 1 capsule (30 mg total) by mouth nightly. 30 capsule 2    ergocalciferol, vitamin D2, (VITAMIN D2 ORAL) Take 1,000 Units by mouth daily with evening meal.      hydroxychloroquine (PLAQUENIL) 200 mg tablet Take 1.5 tablets (300 mg total) by mouth daily. If unable to tolerate the smell of splitting tab - can take 200mg  on MWF, 400mg  other days of the week 135 tablet 3    hydrOXYzine (ATARAX) 25 MG tablet Take 1 tablet (25 mg total) by mouth every eight (8) hours as needed. 90 tablet 3    losartan (COZAAR) 50 MG tablet Take 1 tablet (50 mg total) by mouth daily. 90 tablet 3    melatonin 5 mg tablet Take 1 tablet (5 mg total) by mouth nightly.      mycophenolate (CELLCEPT) 500 mg tablet Take 2 tablets (1,000 mg total) by mouth two (2) times a day. 120 tablet 0    norethindrone (MICRONOR) 0.35 mg tablet Take 1 tablet by mouth daily. 84 tablet 3    omeprazole (PRILOSEC) 20 MG capsule Take 1 capsule (20 mg total) by mouth daily. 90 capsule 3    oxyCODONE (ROXICODONE) 5 MG immediate release tablet Take 1 tablet (5 mg total) by mouth daily as needed for pain. 30 tablet 0    predniSONE (DELTASONE) 1 MG tablet Take 3 tablets (3 mg total) by mouth daily. 90 tablet 1    tizanidine (ZANAFLEX) 2 MG tablet Take 1 tablet (2 mg total) by mouth every eight (8) hours as needed. 270 tablet 1    traZODone (DESYREL) 50 MG tablet Take 1 tablet (50 mg total) by mouth nightly. Take 1/2  tablet (25 mg) nightly for 7-10 days then increase to 50 mg (1 whole tablet). 90 tablet 1     No current facility-administered medications for this visit.       Health Maintenance:     Health Maintenance   Topic Date Due    COVID-19 Vaccine (3 - Pfizer risk series) 10/27/2020    Influenza Vaccine (1) 04/12/2023    Pneumococcal Vaccine 0-49 (3 of 3 - PPSV23, PCV20 or PCV21) 10/28/2023 (Originally 08/06/2020)    Potassium Monitoring  06/01/2024    Serum Creatinine Monitoring  09/16/2024    DTaP/Tdap/Td Vaccines (4 - Td or Tdap) 04/09/2025    HPV Cotest with Pap Smear (21-65)  01/30/2028    Pap Smear with Cotest HPV (21-65)  02/04/2028    Hepatitis C Screen  Completed       Immunizations:     Immunization History   Administered Date(s) Administered    COVID-19 VAC,MRNA,TRIS(12Y UP)(PFIZER)(GRAY CAP) 09/08/2020    COVID-19 VACC,MRNA,(PFIZER)(PF) 09/29/2020    DTP 12/08/1990    HPV Quadrivalent (Gardasil) 03/24/2006, 06/01/2006, 10/02/2006    Influenza Vaccine Quad(IM)6 MO-Adult(PF) 06/05/2015, 05/06/2016, 07/07/2017, 07/06/2018, 04/29/2019, 05/17/2020    Influenza Virus Vaccine, unspecified formulation 05/06/2016, 07/07/2017    MMR 12/08/1990    Meningococcal C Conjugate 03/08/2004    Novel Influenza-h1n1-09, All Formulations 07/20/2008    PNEUMOCOCCAL POLYSACCHARIDE 23-VALENT 08/07/2015    PPD Test 05/20/2009    Pneumococcal Conjugate 13-Valent 04/10/2015    SHINGRIX-ZOSTER VACCINE (HZV),RECOMBINANT,ADJUVANTED(IM) 12/04/2021, 07/05/2022    TdaP 02/16/2012, 04/10/2015     I have reviewed and (if needed) updated the patient's problem list, medications, allergies, past medical and surgical history, social and family history.     Vital Signs:     Wt Readings from Last 3 Encounters:   10/06/23 68 kg (150 lb)   09/17/23 64.9 kg (143 lb)   08/21/23 63.5 kg (140 lb)     Temp Readings from Last 3 Encounters:   10/06/23 36.7 ??C (98 ??F) (Oral)   09/17/23 36.7 ??C (98.1 ??F) (Temporal)   08/21/23 36.6 ??C (97.9 ??F) (Oral)     BP Readings from Last 3 Encounters:   10/06/23 118/82   09/17/23 102/76   08/21/23 120/96     Pulse Readings from Last 3 Encounters:   10/06/23 94 09/17/23 86   08/21/23 76     Estimated body mass index is 28.87 kg/m?? as calculated from the following:    Height as of 08/21/23: 149.9 cm (4' 11.02).    Weight as of 09/17/23: 64.9 kg (143 lb).  No height and weight on file for this encounter.      Objective:      General: Alert and oriented x3. Well-appearing. No acute distress.   HEENT:  Normocephalic.  Atraumatic. Conjunctiva and sclera normal. EAC and TMs clear bilaterally. Audible nasal congestion. No visible nasal discharge. Turbinates normal. OP MMM without lesions.   Neck:  Supple. No thyroid enlargement. No adenopathy.   Heart:  Regular rate and rhythm. Normal S1, S2. No murmurs, rubs or gallops.   Lungs:  No respiratory distress.  Lungs clear to auscultation. No wheezes, rhonchi, or rales.   GI/GU:  Soft, +BS, nondistended, non-TTP. No palpable masses or organomegaly.   Extremities:  No edema. Peripheral pulses normal.   Skin:  Warm, dry. No rash or lesions present.   Neuro:  Non-focal. No obvious weakness.   Psych:  Affect normal, eye contact good, speech clear and coherent.  Noralyn Pick, FNP

## 2023-10-06 ENCOUNTER — Ambulatory Visit: Admit: 2023-10-06 | Discharge: 2023-10-07 | Payer: Vision Other Private Insurance | Attending: Family | Primary: Family

## 2023-10-06 DIAGNOSIS — J3489 Other specified disorders of nose and nasal sinuses: Principal | ICD-10-CM

## 2023-10-06 MED ORDER — IPRATROPIUM BROMIDE 42 MCG (0.06 %) NASAL SPRAY
Freq: Three times a day (TID) | NASAL | 0 refills | 28.00 days | Status: CP
Start: 2023-10-06 — End: 2024-10-05

## 2023-10-07 DIAGNOSIS — M3219 Other organ or system involvement in systemic lupus erythematosus: Principal | ICD-10-CM

## 2023-10-07 MED ORDER — MYCOPHENOLATE MOFETIL 500 MG TABLET
ORAL_TABLET | Freq: Two times a day (BID) | ORAL | 0 refills | 30.00 days
Start: 2023-10-07 — End: ?

## 2023-10-07 NOTE — Unmapped (Signed)
 [facility] Clinical Assessment & Refill Coordination Note    Julia Bates, DOB: 01-May-1986  Phone: [phone] (home)     All above HIPAA information was verified with patient.     Was a Nurse, learning disability used for this call? No    Specialty Medication(s):   Inflammatory Disorders: mycophenolate     Current Outpatient Medications   Medication Sig Dispense Refill    calcium carbonate-vitamin D3 600 mg(1,500mg ) -200 unit per tablet Take 1 tablet by mouth Two (2) times a day.      diclofenac sodium (VOLTAREN) 1 % gel Apply 2 g topically four (4) times a day. 100 g 5    DULoxetine (CYMBALTA) 30 MG capsule Take 1 capsule (30 mg total) by mouth nightly. 30 capsule 2    ergocalciferol, vitamin D2, (VITAMIN D2 ORAL) Take 1,000 Units by mouth daily with evening meal.      hydroxychloroquine (PLAQUENIL) 200 mg tablet Take 1.5 tablets (300 mg total) by mouth daily. If unable to tolerate the smell of splitting tab - can take 200mg  on MWF, 400mg  other days of the week 135 tablet 3    hydrOXYzine (ATARAX) 25 MG tablet Take 1 tablet (25 mg total) by mouth every eight (8) hours as needed. 90 tablet 3    ipratropium (ATROVENT) 42 mcg (0.06 %) nasal spray 2 sprays into each nostril Three (3) times a day. Use for 7-10 days. 15 mL 0    losartan (COZAAR) 50 MG tablet Take 1 tablet (50 mg total) by mouth daily. 90 tablet 3    melatonin 5 mg tablet Take 1 tablet (5 mg total) by mouth nightly.      mycophenolate (CELLCEPT) 500 mg tablet Take 2 tablets (1,000 mg total) by mouth two (2) times a day. 120 tablet 0    norethindrone (MICRONOR) 0.35 mg tablet Take 1 tablet by mouth daily. 84 tablet 3    omeprazole (PRILOSEC) 20 MG capsule Take 1 capsule (20 mg total) by mouth daily. 90 capsule 3    oxyCODONE (ROXICODONE) 5 MG immediate release tablet Take 1 tablet (5 mg total) by mouth daily as needed for pain. 30 tablet 0    predniSONE (DELTASONE) 1 MG tablet Take 3 tablets (3 mg total) by mouth daily. (Patient taking differently: Take 2 tablets (2 mg total) by mouth daily.) 90 tablet 1    tizanidine (ZANAFLEX) 2 MG tablet Take 1 tablet (2 mg total) by mouth every eight (8) hours as needed. 270 tablet 1    traZODone (DESYREL) 50 MG tablet Take 1 tablet (50 mg total) by mouth nightly. Take 1/2 tablet (25 mg) nightly for 7-10 days then increase to 50 mg (1 whole tablet). 90 tablet 1     No current facility-administered medications for this visit.        Changes to medications: Earnestene reports no changes at this time.    Medication list has been reviewed and updated in Epic: Yes    Allergies   Allergen Reactions    Sulfa (Sulfonamide Antibiotics) Nausea And Vomiting and Other (See Comments)     Other reaction(s): FEVER       Changes to allergies: No    Allergies have been reviewed and updated in Epic: Yes    SPECIALTY MEDICATION ADHERENCE     mycophenolate 500 mg: 7-10 days of medicine on hand       Medication Adherence    Patient reported X missed doses in the last month: 0  Specialty Medication: mycophenolate 500mg   Patient is on additional specialty medications: No  Informant: patient          Specialty medication(s) dose(s) confirmed: Regimen is correct and unchanged.     Are there any concerns with adherence? No    Adherence counseling provided? Not needed    CLINICAL MANAGEMENT AND INTERVENTION      Clinical Benefit Assessment:    Do you feel the medicine is effective or helping your condition? Yes    Clinical Benefit counseling provided?  It hasn't helped as much lately , saw MD in Feb 2025    Adverse Effects Assessment:    Are you experiencing any side effects? No    Are you experiencing difficulty administering your medicine? No    Quality of Life Assessment:    Quality of Life    Rheumatology  Oncology  Dermatology  Cystic Fibrosis          How many days over the past month did your SLE  keep you from your normal activities? For example, brushing your teeth or getting up in the morning. Still affects her    Have you discussed this with your provider? Yes    Acute Infection Status:    Acute infections noted within Epic:  No active infections    Patient reported infection: None    Therapy Appropriateness:    Is therapy appropriate based on current medication list, adverse reactions, adherence, clinical benefit and progress toward achieving therapeutic goals? Yes, therapy is appropriate and should be continued     Clinical Intervention:    Was an intervention completed as part of this clinical assessment? No    DISEASE/MEDICATION-SPECIFIC INFORMATION      N/A    Chronic Inflammatory Diseases: Have you experienced any flares in the last month? No    PATIENT SPECIFIC NEEDS     Does the patient have any physical, cognitive, or cultural barriers? No    Is the patient high risk? No    Does the patient require physician intervention or other additional services (i.e., nutrition, smoking cessation, social work)? No    Does the patient have an additional or emergency contact listed in their chart? Yes    SOCIAL DETERMINANTS OF HEALTH     At the [facility], we have learned that life circumstances - like trouble affording food, housing, utilities, or transportation can affect the health of many of our patients.   That is why we wanted to ask: are you currently experiencing any life circumstances that are negatively impacting your health and/or quality of life? Patient declined to answer    Social Drivers of Health     Food Insecurity: No Food Insecurity (08/21/2023)    Hunger Vital Sign     Worried About Running Out of Food in the Last Year: Never true     Ran Out of Food in the Last Year: Never true   Internet Connectivity: No Internet connectivity concern identified (06/04/2022)    Internet Connectivity     Do you have access to internet services: Yes     How do you connect to the internet: Personal Device at home     Is your internet connection strong enough for you to watch video on your device without major problems?: Yes     Do you have enough data to get through the month?: Yes     Does at least one of the devices have a camera that you can use for video  chat?: Yes   Housing/Utilities: Low Risk  (08/21/2023)    Housing/Utilities     Within the past 12 months, have you ever stayed: outside, in a car, in a tent, in an overnight shelter, or temporarily in someone else's home (i.e. couch-surfing)?: No     Are you worried about losing your housing?: No     Within the past 12 months, have you been unable to get utilities (heat, electricity) when it was really needed?: No   Tobacco Use: Low Risk  (10/06/2023)    Patient History     Smoking Tobacco Use: Never     Smokeless Tobacco Use: Never     Passive Exposure: Never   Transportation Needs: No Transportation Needs (08/21/2023)    PRAPARE - Transportation     Lack of Transportation (Medical): No     Lack of Transportation (Non-Medical): No   Alcohol Use: Not At Risk (08/21/2023)    Alcohol Use     How often do you have a drink containing alcohol?: Never     How many drinks containing alcohol do you have on a typical day when you are drinking?: Not on file     How often do you have 5 or more drinks on one occasion?: Never   Interpersonal Safety: Not At Risk (06/04/2022)    Interpersonal Safety     Unsafe Where You Currently Live: No     Physically Hurt by Anyone: No     Abused by Anyone: No   Physical Activity: Not on file   Intimate Partner Violence: Not At Risk (10/30/2021)    Humiliation, Afraid, Rape, and Kick questionnaire     Fear of Current or Ex-Partner: No     Emotionally Abused: No     Physically Abused: No     Sexually Abused: No   Stress: No Stress Concern Present (06/04/2022)    [employer] of Occupational Health - Occupational Stress Questionnaire     Feeling of Stress : Not at all   Substance Use: Low Risk  (08/21/2023)    Substance Use     In the past year, how often have you used prescription drugs for non-medical reasons?: Never     In the past year, how often have you used illegal drugs?: Never     In the past year, have you used any substance for non-medical reasons?: No   Social Connections: Not on file   Financial Resource Strain: Low Risk  (02/25/2023)    Overall Financial Resource Strain (CARDIA)     Difficulty of Paying Living Expenses: Not hard at all   Depression: Not at risk (08/21/2023)    PHQ-2     PHQ-2 Score: 0   Health Literacy: Not on file       Would you be willing to receive help with any of the needs that you have identified today? No       SHIPPING     Specialty Medication(s) to be Shipped:   Inflammatory Disorders: mycophenolate    Other medication(s) to be shipped: No additional medications requested for fill at this time     Changes to insurance: No    Delivery Scheduled: Yes, Expected medication delivery date: 3/4.     Medication will be delivered via Next Day Courier to the confirmed prescription address in [facility].    The patient will receive a drug information handout for each medication shipped and additional FDA Medication Guides as required.  Verified that patient has previously  received a Conservation officer, historic buildings and a Surveyor, mining.    The patient or caregiver noted above participated in the development of this care plan and knows that they can request review of or adjustments to the care plan at any time.      All of the patient's questions and concerns have been addressed.    Julianne Rice, PharmD   [facility] Specialty Pharmacist

## 2023-10-08 MED ORDER — MYCOPHENOLATE MOFETIL 500 MG TABLET
ORAL_TABLET | Freq: Two times a day (BID) | ORAL | 3 refills | 90.00 days
Start: 2023-10-08 — End: ?

## 2023-10-08 NOTE — Unmapped (Signed)
 [facility] refill  Last Visit Date: 09/17/2023  Next Visit Date: 02/09/2024    Lab Results   Component Value Date    ALT 17 09/17/2023    AST 24 09/17/2023    ALBUMIN 4.0 06/02/2023    CREATININE 0.90 09/17/2023     Lab Results   Component Value Date    WBC 7.0 09/17/2023    HGB 12.9 09/17/2023    HCT 38.6 09/17/2023    PLT 249 09/17/2023     Lab Results   Component Value Date    NEUTROPCT 76.7 09/17/2023    LYMPHOPCT 11.4 09/17/2023    MONOPCT 11.1 09/17/2023    EOSPCT 0.4 09/17/2023    BASOPCT 0.4 09/17/2023

## 2023-10-11 DIAGNOSIS — R7401 Transaminitis: Principal | ICD-10-CM

## 2023-10-11 DIAGNOSIS — M328 Other forms of systemic lupus erythematosus: Principal | ICD-10-CM

## 2023-10-12 MED ORDER — MYCOPHENOLATE MOFETIL 500 MG TABLET
ORAL_TABLET | Freq: Two times a day (BID) | ORAL | 3 refills | 90.00 days | Status: CP
Start: 2023-10-12 — End: ?
  Filled 2023-10-12: qty 120, 30d supply, fill #0

## 2023-10-16 ENCOUNTER — Ambulatory Visit
Admit: 2023-10-16 | Discharge: 2023-10-17 | Payer: Vision Other Private Insurance | Attending: Student in an Organized Health Care Education/Training Program | Primary: Student in an Organized Health Care Education/Training Program

## 2023-10-16 DIAGNOSIS — G8929 Other chronic pain: Principal | ICD-10-CM

## 2023-10-16 DIAGNOSIS — M7918 Myalgia, other site: Principal | ICD-10-CM

## 2023-10-16 DIAGNOSIS — M545 Chronic bilateral low back pain without sciatica: Principal | ICD-10-CM

## 2023-10-16 MED ORDER — DULOXETINE 30 MG CAPSULE,DELAYED RELEASE
ORAL_CAPSULE | ORAL | 2 refills | 30.00 days | Status: CP
Start: 2023-10-16 — End: 2024-01-14

## 2023-10-16 NOTE — Unmapped (Signed)
 PM&R - [facility]     Referring Provider: Albesa Seen, MD  Primary Care Provider: Loran Senters, FNP    Julia Bates is a 38 y.o. old female  has a past medical history of Abnormal Pap smear of cervix (2010), Allergic, Cataract, Deep vein thrombosis (CMS-HCC) (May 24 2022), Dysplasia of cervix, low grade (CIN 1) (08/10/2018), Essential hypertension (09/23/2022), GERD (gastroesophageal reflux disease) (Oct 23), HPV (human papilloma virus) infection, Low back pain, Lupus, Lupus, Osteoporosis, Rheumatoid arthritis (CMS-HCC) (11/18/2015), and SLE (systemic lupus erythematosus related syndrome) (CMS-HCC). who is being seen today by Delories Heinz, DO, at the consultation request of Albesa Seen, MD for follow up visit.     Pertinent medical records reviewed for this visit.    Chief Complaint:   Chief Complaint   Patient presents with    Back Pain    Difficulty Walking    Neck Pain    Numbness     Right arm, biateral legs    Chronic Condition Follow-Up     lupus     Interval History 10/16/2023:  Patient presents today with persistent low back pain. She has completed PT. She has been taking oxycodone and tizanidine. She notes she took meloxicam for 14 days with some improvement in her pain. She has been taking cymbalta 60mg  daily.     History of Present Illness:  Pain Description:  The pain started many years ago without inciting event  Since the pain first started, it is worse.  The most significant location of pain is the low back without radicular pain   The 1-2 words that best describe the pain: sharp and aching  The pain improves with oxycodone, heating.  The pain is made worse with standing for prolonged time  The average daily pain score is 7/10. The pain can be as high as 9/10 at its worst.  The pain interferes with: All activities.    Red flag symptoms:  Weakness: Yes - subjective, due to lupus  Bladder incontinence: No   Bowel incontinence: No   Saddle anesthesia: No Therapies:  Previously attempted interventions for this pain concern:   -None    Has physical therapy been initiated or completed for this pain concern? Yes  Where was physical therapy completed? At [facility] based clinic  Has a home exercise program (directed by a physical therapist or medical provider) been completed for this pain concern? Yes    Medications:  Currently utilized over the counter medications/therapy for pain: Acetaminophen/Tylenol    Currently utilized prescription medications/therapies for pain: Muscle relaxers   -Tizanidine 2mg  nightly PRN    Currently utilized controlled medications: Pure opioid agonist (Oxycodone/Hydrocodone/Dilaudid/Nucynta/Fentanyl/Methadone)  - Oxycodone - Rx from rheumatology     Is the patient on a blood thinner (including aspirin, Goody/BC/Bayer powder)? No   - Name of anticoagulant/blood thinner: Not applicable   - Used to take eliquis for DVT - completed in April 2024  ____________________________________________________________________  Past Medical History:  Past Medical History:   Diagnosis Date    Abnormal Pap smear of cervix 2010    Allergic     Sulfa    Cataract     Deep vein thrombosis (CMS-HCC) May 24 2022    Dysplasia of cervix, 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    ECC pos CIN 1--12/19    Essential hypertension 09/23/2022    GERD (gastroesophageal reflux disease) Oct 23    HPV (human papilloma virus) infection     Low back pain  Lupus     Lupus     Osteoporosis     Rheumatoid arthritis (CMS-HCC) 11/18/2015    SLE (systemic lupus erythematosus related syndrome) (CMS-HCC)        Past Surgical History:  Past Surgical History:   Procedure Laterality Date    CERVICAL BIOPSY  W/ LOOP ELECTRODE EXCISION      COLPOSCOPY      SKIN BIOPSY         Family History:  Family History   Problem Relation Age of Onset    Glaucoma Father     Diabetes Father     Hypertension Father     Heart attack Father     Cataracts Father     Stroke Father     Neuropathy Mother     Osteoporosis Mother     No Known Problems Sister     Diabetes Sister     Diabetes Sister     Diabetes Sister     Diabetes Sister     Melanoma Neg Hx     Basal cell carcinoma Neg Hx     Squamous cell carcinoma Neg Hx     Macular degeneration Neg Hx     Blindness Neg Hx        Social History:  Social History     Tobacco Use    Smoking status: Never     Passive exposure: Never    Smokeless tobacco: Never    Tobacco comments:     None   Substance Use Topics    Alcohol use: Never       Allergies:  Sulfa (sulfonamide antibiotics)    Current Medications:  Current Outpatient Medications   Medication Sig Dispense Refill    traZODone (DESYREL) 50 MG tablet Take 1 tablet (50 mg total) by mouth nightly. Take 1/2 tablet (25 mg) nightly for 7-10 days then increase to 50 mg (1 whole tablet). 90 tablet 1    calcium carbonate-vitamin D3 600 mg(1,500mg ) -200 unit per tablet Take 1 tablet by mouth Two (2) times a day.      diclofenac sodium (VOLTAREN) 1 % gel Apply 2 g topically four (4) times a day. 100 g 5    DULoxetine (CYMBALTA) 30 MG capsule Take 1 capsule (30 mg total) by mouth daily AND 2 capsules (60 mg total) nightly. 90 capsule 2    ergocalciferol, vitamin D2, (VITAMIN D2 ORAL) Take 1,000 Units by mouth daily with evening meal.      hydroxychloroquine (PLAQUENIL) 200 mg tablet Take 1.5 tablets (300 mg total) by mouth daily. If unable to tolerate the smell of splitting tab - can take 200mg  on MWF, 400mg  other days of the week 135 tablet 3    hydrOXYzine (ATARAX) 25 MG tablet Take 1 tablet (25 mg total) by mouth every eight (8) hours as needed. 90 tablet 3    ipratropium (ATROVENT) 42 mcg (0.06 %) nasal spray 2 sprays into each nostril Three (3) times a day. Use for 7-10 days. 15 mL 0    losartan (COZAAR) 50 MG tablet Take 1 tablet (50 mg total) by mouth daily. 90 tablet 3    melatonin 5 mg tablet Take 1 tablet (5 mg total) by mouth nightly.      mycophenolate (CELLCEPT) 500 mg tablet Take 2 tablets (1,000 mg total) by mouth two (2) times a day. 360 tablet 3    norethindrone (MICRONOR) 0.35 mg tablet Take 1 tablet by mouth daily. 84 tablet 3  omeprazole (PRILOSEC) 20 MG capsule Take 1 capsule (20 mg total) by mouth daily. 90 capsule 3    oxyCODONE (ROXICODONE) 5 MG immediate release tablet Take 1 tablet (5 mg total) by mouth daily as needed for pain. 30 tablet 0    predniSONE (DELTASONE) 1 MG tablet Take 3 tablets (3 mg total) by mouth daily. (Patient taking differently: Take 2 tablets (2 mg total) by mouth daily.) 90 tablet 1    tizanidine (ZANAFLEX) 2 MG tablet Take 1 tablet (2 mg total) by mouth every eight (8) hours as needed. 270 tablet 1     No current facility-administered medications for this visit.       Review of Systems:  Pertinent positive/negative findings related to today's visit are noted in the HPI and Assessment/Plan.  ____________________________________________________________________  Vitals:  Vitals:    10/16/23 1137   Temp: 36.3 ??C (97.3 ??F)   TempSrc: Temporal   Weight: 68.4 kg (150 lb 12.8 oz)   Height: 149.9 cm (4' 11)       Physical Exam:    Constitutional: Well developed, Well nourished, No acute distress and Interactive.  General: Patient is alert and orientated.  Psychological: The patient's mood is normal, and appropriate for the circumstances.    Pain (Neuro/Musculoskeletal) Exam:  Gait: Normal. Uses assist device - none  Reflexes: No clonus    Reflex LEFT RIGHT   Patellar 2+ 2+   Achilles 2+ 2+     Motor   Muscle  LEFT RIGHT    EHL (L5) 5/5 5/5   TA (L4) 5/5 5/5   GS (S1) 5/5 5/5   Quad (L3) 5/5 5/5   HS (L2) 5/5 5/5   IS 5/5 5/5     Appropriate strength without any gross motor deficit appreciated.    Sensory:  Intact to light touch of all four extremities    Special Exams:  Lumbar:  Positive lumbar paraspinal tenderness  bilaterally  Positive pain with lumbar facet loading  bilaterally .  Negative SLR bilaterally  ____________________________________________________________________  Controlled Substance Monitoring:  NCPDMP: Reviewed Today    Prescriptions  Total: 26 - Private Pay: 7  Showing 1-15 of 26 Items View 15 Items   of 2   Filled  Written  Sold  ID  Drug  QTY  Days  Prescriber  RX #  Dispenser  Refill  Daily Dose*  Pymt Type  PMP    09/28/2023 09/28/2023  1 Oxycodone Hcl (Ir) 5 Mg Tablet 30.00 30 Gr E [id number] Nor (1409) 0/0 7.50 MME Medicaid [place]   08/28/2023 08/28/2023  1 Oxycodone Hcl (Ir) 5 Mg Tablet 30.00 30 Gr E [id number] Nor (1409) 0/0 7.50 MME Medicaid [place]   07/29/2023 07/29/2023  1 Oxycodone Hcl (Ir) 5 Mg Tablet 30.00 30 Gr E [id number] Nor (1409) 0/0 7.50 MME Medicaid [place]   06/30/2023 06/30/2023  1 Oxycodone Hcl (Ir) 5 Mg Tablet 30.00 30 Gr E [id number] Nor (1409) 0/0 7.50 MME Medicaid [place]   05/30/2023 05/27/2023  1 Oxycodone Hcl (Ir) 5 Mg Tablet 30.00 30 Gr E [id number] Nor (1409) 0/0 7.50 MME Medicaid [place]   04/30/2023 04/30/2023  1 Oxycodone Hcl (Ir) 5 Mg Tablet 30.00 30 Gr E [id number] Nor (1409) 0/0 7.50 MME Medicaid [place]   03/31/2023 03/31/2023  1 Oxycodone Hcl (Ir) 5 Mg Tablet 30.00 30 Sa She [id number] Nor (1409) 0/0 7.50 MME Medicaid [place]   02/27/2023 02/27/2023  1 Oxycodone Hcl (Ir) 5 Mg Tablet 30.00 30 Gr E [id number] Nor (  1409) 0/0 7.50 MME Medicaid [place]   01/30/2023 01/29/2023  1 Oxycodone Hcl (Ir) 5 Mg Tablet 30.00 30 Gr E [id number] Nor (1409) 0/0 7.50 MME Private Pay [facility]   12/31/2022 12/26/2022  1 Oxycodone Hcl (Ir) 5 Mg Tablet 30.00 30 Gr E [id number] Nor (1409) 0/0 7.50 MME Private Pay [facility]   12/01/2022 11/25/2022  1 Oxycodone Hcl (Ir) 5 Mg Tablet 30.00 30 Gr E [id number] Nor (1409) 0/0 7.50 MME Private Pay [facility]   10/31/2022 10/27/2022  1 Oxycodone Hcl (Ir) 5 Mg Tablet 30.00 30 Gr E [id number] Nor (1409) 0/0 7.50 MME Private Pay [facility]   10/01/2022 10/01/2022  1 Oxycodone Hcl (Ir) 5 Mg Tablet 30.00 30 Gr E [id number] Nor (1409) 0/0 7.50 MME Private Pay [place]   09/30/2022 09/30/2022  1 Gabapentin 300 Mg Capsule 30.00 10 Da Mel [id number] Nor (1409) 0/0  Private Pay [place]   08/26/2022 08/26/2022  1 Oxycodone Hcl (Ir) 5 Mg Tablet 30.00 30 Gr E ____________________________________________________________________  Imaging and Diagnostic Studies:  All applicable diagnostic studies related to this consultation have been reviewed.  Relevant diagnostic reports and/or my personal review of imaging or other diagnostic studies listed below:    MRI Lumbar Spine Wo Contrast  Status: Final result     PACS Images     Show images for MRI Lumbar Spine Wo Contrast MRI Screening Form    Screening Form      Impression   -- Disc bulge and facet arthrosis at L5-S1 resulting in moderate to severe left and moderate right neural foraminal narrowing. Otherwise patent spinal canal and neuroforamina.              Narrative   EXAM: Magnetic resonance imaging, spinal canal and contents, lumbar, without contrast material.   DATE: 11/24/2022 1:10 PM   ACCESSION: [id number]   DICTATED: 11/24/2022 1:23 PM   INTERPRETATION LOCATION: [facility]      CLINICAL INDICATION: 38 years old Female with chronic LBP  - M54.50 - Chronic bilateral low back pain without sciatica - G89.29 - Chronic bilateral low back pain without sciatica.      COMPARISON: Lumbar spine CT 09/30/2022.      TECHNIQUE: Multiplanar MRI was performed through the lumbar spine without intravenous contrast.      FINDINGS:   Diffuse marrow heterogeneity with suppression on fat saturated sequence likely representing degenerative fatty changes. The visualized cord is unremarkable and the conus medullaris ends at a normal level.      The vertebral bodies are normally aligned. Disc spaces are preserved.      L5-S1: Disc bulge and bilateral facet arthrosis contributes to moderate to severe left and moderate right neural foraminal narrowing.      Otherwise, no significant spinal canal or neural foraminal narrowing.      The paraspinal tissues are within normal limits.      For the purposes of this dictation, the lowest well formed intervertebral disc space is assumed to be the L5-S1 level, and there are presumed to be five lumbar-type vertebral bodies.        ____________________________________________________________________  Relevant Labs Reviewed: Below labs reviewed    Lab Results   Component Value Date    WBC 7.0 09/17/2023    HGB 12.9 09/17/2023    HCT 38.6 09/17/2023    MCV 83.1 09/17/2023    PLT 249 09/17/2023       No results found for: BMP, CMP     Lab Results   Component  Value Date    CREATININE 0.90 09/17/2023       No results found for: HGBA1C  ____________________________________________________________________  Diagnosis:   Diagnosis ICD-10-CM Associated Orders   1. Chronic bilateral low back pain without sciatica  M54.50     G89.29       2. Myofascial pain syndrome of lumbar spine  M79.18       3. Myofascial pain syndrome, cervical  M79.18       4. Myofascial pain syndrome of thoracic spine  M79.18         Assessment and Plan:  Julia Bates is a 38 y.o. old female referred from Reesa Chew B, MD presenting for follow up visit.     Patient presents today for follow up of low back pain without radicular symptoms. She denies any weakness, sensory deficits, or red flag symptoms. Her symptoms are most consistent with degenerative disc disease and lumbar facet mediated pain. We discussed her MRI lumbar spine imaging which demonstrates L5-S1 disc bulge and facet hypertrophy contributing to severe L and moderate R NFS. Patient has been participating in PT.     We discussed several medications. She has tried gabapentin and lyrica previously with side effects. She continues to take cymbalta 60mg  daily. Recommend increasing cymbalta to 30mg  daily and 60mg  nightly. She did take meloxicam for a short period of time with good relief. Per chart review patient with rectal bleeding likely due to hemorrhoids.     Interventional treatments:  -None    Medication recommendations:  -Increase cymbalta to 30mg  daily and 60mg  nightly. Can consider increasing to 60mg  BID (max dose).     Physical therapy:  -The patient has previously completed a physical therapy regimen for the above concern and engages with therapist directed HEP for above concern.    Imaging/diagnostic studies recommended:  -None    Consults/referrals placed:  -None    Follow up recommendation:  -Follow up in 3 months     Treatment plan fully discussed and agreed upon with the patient. All questions were answered to her satisfaction today.    Medical decision making for this patient was moderately complex given the patient's preexisting comorbidities, chronicity of the patient's current pathology, review of relevant records from other healthcare provides, independent interpretation of imaging and/or labs if appropriate, discussion regarding risks and benefits of proceeding forward with or holding off on scheduling a minor procedure if appropriate and designated above, the severity/progression of the pathology discussed, specific medication if reviewed, discussion regarding financial implications of treatment, and/or the risk of complication and/or morbidity that may exist with or without treatment.    Delories Heinz, DO   Interventional Pain/Spine Medicine  Assistant Professor - Physical Medicine & Rehabilitation   [place] [employer]

## 2023-10-16 NOTE — Unmapped (Addendum)
 Dear Ms. Eberly,    Thank you for coming to see Dr. Allena Katz [facility] the [facility] today.    -I have ordered the following medications   Medications ordered during this encounter   Medications    DULoxetine (CYMBALTA) 30 MG capsule     Sig: Take 1 capsule (30 mg total) by mouth daily AND 2 capsules (60 mg total) nightly.     Dispense:  90 capsule     Refill:  2     -Please Return in about 5 months (around 03/17/2024).    You may contact me through MyChart or call our office with any questions.     If you develop any red flag signs such as new or progressive motor weakness, sensory deficits, saddle anesthesia (groin numbness), bowel/bladder dysfunction, gait/coordination disturbance, weight loss, or night pain, you should call our office and/or seek prompt evaluation at the nearest ER.    Delories Heinz, DO  Interventional Pain Medicine

## 2023-10-23 DIAGNOSIS — G4700 Insomnia, unspecified: Principal | ICD-10-CM

## 2023-10-23 DIAGNOSIS — J3489 Other specified disorders of nose and nasal sinuses: Principal | ICD-10-CM

## 2023-10-23 DIAGNOSIS — G8929 Other chronic pain: Principal | ICD-10-CM

## 2023-10-23 MED ORDER — IPRATROPIUM BROMIDE 42 MCG (0.06 %) NASAL SPRAY
Freq: Three times a day (TID) | NASAL | 0 refills | 19.00 days
Start: 2023-10-23 — End: 2024-10-22

## 2023-10-23 MED ORDER — TRAZODONE 50 MG TABLET
ORAL_TABLET | Freq: Every evening | ORAL | 3 refills | 90.00 days | Status: CP
Start: 2023-10-23 — End: 2024-10-22

## 2023-10-23 NOTE — Unmapped (Signed)
 Patient is requesting the following refill  Requested Prescriptions     Pending Prescriptions Disp Refills    traZODone (DESYREL) 50 MG tablet 90 tablet 1     Sig: Take 1 tablet (50 mg total) by mouth nightly. Take 1/2 tablet (25 mg) nightly for 7-10 days then increase to 50 mg (1 whole tablet).       Recent Visits  Date Type Provider Dept   10/06/23 Office Visit Loran Senters, FNP [place] [facility] [address] [facility]   08/21/23 Office Visit Loran Senters, FNP [place] [facility] [address] [facility]   02/25/23 Office Visit Loran Senters, FNP [place] [facility] [address] [facility]   10/28/22 Office Visit Loran Senters, FNP [place] [facility] [address] [facility]   Showing recent visits within past 365 days and meeting all other requirements  Future Appointments  Date Type Provider Dept   02/29/24 Appointment Loran Senters, FNP [place] [facility] [address] At [facility]   Showing future appointments within next 365 days and meeting all other requirements       Labs: Not applicable this refill

## 2023-10-23 NOTE — Unmapped (Addendum)
 Oxycodone refill  Last ov: 09/17/2023  Next ov: 02/09/2024

## 2023-10-23 NOTE — Unmapped (Signed)
 Patient is requesting the following refill  Requested Prescriptions     Pending Prescriptions Disp Refills    ipratropium (ATROVENT) 42 mcg (0.06 %) nasal spray [Pharmacy Med Name: IPRATROPIUM 0.06% SPRAY]  0     Sig: 2 SPRAYS INTO EACH NOSTRIL THREE (3) TIMES A DAY. USE FOR 7-10 DAYS.       Recent Visits  Date Type Provider Dept   10/06/23 Office Visit Loran Senters, FNP [place] [facility] [address] [facility]   08/21/23 Office Visit Loran Senters, FNP [place] [facility] [address] [facility]   02/25/23 Office Visit Loran Senters, FNP [place] [facility] [address] [facility]   10/28/22 Office Visit Loran Senters, FNP [place] [facility] [address] [facility]   Showing recent visits within past 365 days and meeting all other requirements  Future Appointments  Date Type Provider Dept   02/29/24 Appointment Loran Senters, FNP [place] [facility] [address] [facility]   Showing future appointments within next 365 days and meeting all other requirements       Labs: Not applicable this refill

## 2023-10-26 MED ORDER — OXYCODONE 5 MG TABLET
ORAL_TABLET | Freq: Every day | ORAL | 0 refills | 30.00 days | Status: CP | PRN
Start: 2023-10-26 — End: ?

## 2023-10-27 MED ORDER — IPRATROPIUM BROMIDE 42 MCG (0.06 %) NASAL SPRAY
Freq: Three times a day (TID) | NASAL | 0 refills | 19.00 days | Status: CP
Start: 2023-10-27 — End: 2024-10-26

## 2023-10-29 ENCOUNTER — Ambulatory Visit: Admit: 2023-10-29 | Discharge: 2023-10-30 | Payer: Vision Other Private Insurance

## 2023-10-29 DIAGNOSIS — G250 Essential tremor: Principal | ICD-10-CM

## 2023-10-29 LAB — TSH: THYROID STIMULATING HORMONE: 4.346 u[IU]/mL (ref 0.550–4.780)

## 2023-10-29 MED ORDER — PROPRANOLOL 10 MG TABLET
ORAL_TABLET | Freq: Two times a day (BID) | ORAL | 3 refills | 30.00 days | Status: CP
Start: 2023-10-29 — End: 2024-12-02

## 2023-10-29 NOTE — Unmapped (Signed)
 [facility] Neurology Clinic Summary       [facility] [address]  [facility] [address] [place]  [address]  [place] [address]  [phone]    Date: 10/29/2023  Patient Name: Julia Bates  MRN: [id number]  PCP: Hinda Kehr, Jonnie Finner,*            [name] is a 38 y.o. right handed female seen in consultation at the [place] [facility] at the request of Dr. Harless Nakayama for evaluation.          Subjective:   Subjective          HPI: Patient is a 38 y.o. female seen at the request of Rockwell Alexandria,*.    History of Present Illness  Julia Bates is a 38 year old female with lupus who presents with tremor in both hands.    She has been experiencing a tremor in both hands for the past couple of months, with the right hand being more affected. The tremor sometimes interferes with activities such as drinking from a cup and using utensils, but there are no issues with writing. It becomes more pronounced when handing objects to others. No vivid dreams, acting out dreams, trouble with swallowing or talking, or moving slowly. She mentions a history of falls a couple of months ago but does not report frequent falls.    Her past medical history is significant for lupus, for which she has been on long-term prednisone since she was 38 years old. She notes that prednisone can exacerbate her tremors.    She denies any family history of similar tremors.                  Past Medical Hx, Family Hx, Social Hx, and Problem List has been reviewed in the [employer].        REVIEW OF SYSTEMS:  ROS        Objective:       Physical Exam:  Blood pressure 105/76, pulse 91, temperature 36 ??C (96.8 ??F), temperature source Temporal, weight 70.3 kg (155 lb), last menstrual period 02/08/2022, not currently breastfeeding.       Physical Exam          Neurological Exam    Physical Exam  NEUROLOGICAL: Awake and alert. Extraocular movements intact. Visual fields intact. Pupils equal, round, and reactive to light. No facial weakness or sensory deficit. No tongue or palate deviation. Neck flexion and extension 5/5. Trapezius, deltoid, triceps, bicep, finger extension, finger flexion, hip flexion, knee extension, and dorsiflexion strength 5/5 bilaterally. Biceps, triceps, and brachioradialis reflexes 2+ bilaterally. Knee reflexes 3+. Ankle reflexes 0. Light touch normal bilaterally. Finger to nose normal. Low amplitude, high frequency tremor in all fingers, worsens on finger to nose exam. Romberg and gait normal.                 Assessment & Plan:   Assessment     [name] is a 38 y.o. right handed female seen in consultation for the following problems.      Diagnosis ICD-10-CM Associated Orders   1. Essential tremor  G25.0 Ambulatory referral to Neurology     TSH          Assessment & Plan  Benign Essential Tremor  Consistent with benign essential tremor, not Parkinson's disease. Prednisone for lupus may exacerbate the tremor. Propranolol  suggested for symptom management.  - Order TSH test.  - Start propranolol 10 mg twice daily.  - Monitor for side effects such as decreased pulse and blood pressure.  - Consider switching to primidone if propranolol is ineffective or not tolerated.          Thank you very much for this consultation. Please let us know if any questions or concerns.     Sincerely,  Lysbeth Galas, MD     CC: Rockwell Alexandria,*

## 2023-11-08 DIAGNOSIS — G4700 Insomnia, unspecified: Principal | ICD-10-CM

## 2023-11-08 MED ORDER — TRAZODONE 50 MG TABLET
ORAL_TABLET | Freq: Every evening | ORAL | 3 refills | 90 days
Start: 2023-11-08 — End: 2024-11-07

## 2023-11-08 NOTE — Unmapped (Signed)
 Patient is requesting the following refill  Requested Prescriptions     Pending Prescriptions Disp Refills    traZODone (DESYREL) 50 MG tablet 90 tablet 3     Sig: Take 1 tablet (50 mg total) by mouth nightly.       Recent Visits  Date Type Provider Dept   10/06/23 Office Visit Loran Senters, FNP [place] [facility] [address] [facility]   08/21/23 Office Visit Loran Senters, FNP [place] [facility] [address] [facility]   02/25/23 Office Visit Loran Senters, FNP [place] [facility] [address] [facility]   Showing recent visits within past 365 days and meeting all other requirements  Future Appointments  Date Type Provider Dept   11/13/23 Appointment Loran Senters, FNP [place] [facility] [address] [facility]   02/29/24 Appointment Ninetta Lights, Esaw Grandchild, FNP  [facility] Care [address] At [facility]   Showing future appointments within next 365 days and meeting all other requirements       Labs: GAD7:        No data to display               PHQ9:   PHQ-9 PHQ-9 Total Score   06/04/2022   9:00 AM 6

## 2023-11-10 DIAGNOSIS — G4700 Insomnia, unspecified: Principal | ICD-10-CM

## 2023-11-10 MED ORDER — TRAZODONE 50 MG TABLET
ORAL_TABLET | Freq: Every evening | ORAL | 3 refills | 90 days | Status: CP
Start: 2023-11-10 — End: 2024-11-09

## 2023-11-16 DIAGNOSIS — L299 Pruritus, unspecified: Principal | ICD-10-CM

## 2023-11-16 MED ORDER — HYDROXYZINE HCL 25 MG TABLET
ORAL_TABLET | Freq: Three times a day (TID) | ORAL | 0 refills | 90.00 days | Status: CP | PRN
Start: 2023-11-16 — End: ?

## 2023-11-16 NOTE — Unmapped (Signed)
 [facility] Refill Coordination Note    Julia Bates, DOB: [birth year]  Phone: [phone] (home)       All above HIPAA information was verified with patient.         11/16/2023    12:22 AM   Specialty Rx Medication Refill Questionnaire   Which Medications would you like refilled and shipped? Cellcept 500 mg 2x a day   If medication refills are not needed at this time, please indicate the reason below. No other meds. Have in stock   Please list all current allergies: Sulfa   Have you missed any doses in the last 30 days? No   Have you had any changes to your medication(s) since your last refill? No   How many days remaining of each medication do you have at home? 1 week   Have you experienced any side effects in the last 30 days? No   Please enter the full address (street address, city, state, zip code) where you would like your medication(s) to be delivered to. [address] [place] [address]   Please specify on which day you would like your medication(s) to arrive. Note: if you need your medication(s) within 3 days, please call the pharmacy to schedule your order at [phone]  11/20/2023   Has your insurance changed since your last refill? No   Would you like a pharmacist to call you to discuss your medication(s)? No   Do you require a signature for your package? (Note: if we are billing Medicare Part B or your order contains a controlled substance, we will require a signature) No   I have been provided my out of pocket cost for my medication and approve the pharmacy to charge the amount to my credit card on file. No   Additional Information Confirm   Additional Comments: Please call me to update card on file. If card ends in 2920. You can charge it. Please call first to verify card.         Completed refill call assessment today to schedule patient's medication shipment from the [facility] [phone]).  All relevant notes have been reviewed.       Confirmed patient received a Conservation officer, historic buildings and a Surveyor, mining with first shipment. The patient will receive a drug information handout for each medication shipped and additional FDA Medication Guides as required.         REFERRAL TO PHARMACIST     Referral to the pharmacist: Not needed      Julia Bates     Shipping address confirmed in Epic.     Delivery Scheduled: Yes, Expected medication delivery date: 4/11.     Medication will be delivered via Next Day Courier to the prescription address in Epic WAM.    [facility] Specialty Technician

## 2023-11-16 NOTE — Unmapped (Signed)
 Hydroxyzine refill (per pharmacy, need new 90 day supply script)    Last ov: 09/17/2023  Next ov: 02/09/2024

## 2023-11-20 MED FILL — MYCOPHENOLATE MOFETIL 500 MG TABLET: ORAL | 30 days supply | Qty: 120 | Fill #1

## 2023-11-26 DIAGNOSIS — G8929 Other chronic pain: Principal | ICD-10-CM

## 2023-11-26 MED ORDER — OXYCODONE 5 MG TABLET
ORAL_TABLET | Freq: Every day | ORAL | 0 refills | 30.00 days | PRN
Start: 2023-11-26 — End: ?

## 2023-11-27 MED ORDER — OXYCODONE 5 MG TABLET
ORAL_TABLET | Freq: Every day | ORAL | 0 refills | 30.00 days | Status: CP | PRN
Start: 2023-11-27 — End: ?

## 2023-11-27 NOTE — Unmapped (Signed)
 Oxycodone refill  Last ov: 09/17/2023  Next ov: 02/09/2024

## 2023-12-01 DIAGNOSIS — R1319 Other dysphagia: Principal | ICD-10-CM

## 2023-12-01 MED ORDER — OMEPRAZOLE 20 MG CAPSULE,DELAYED RELEASE
ORAL_CAPSULE | Freq: Every day | ORAL | 2 refills | 90.00 days | Status: CP
Start: 2023-12-01 — End: 2024-11-30

## 2023-12-01 NOTE — Unmapped (Signed)
 Patient is requesting the following refill  Requested Prescriptions      No prescriptions requested or ordered in this encounter       Recent Visits  Date Type Provider Dept   10/06/23 Office Visit Baldwin Bones, FNP [facility] [address] [facility]   08/21/23 Office Visit Baldwin Bones, FNP [facility] [address] [facility]   02/25/23 Office Visit Baldwin Bones, FNP [facility] [address] [facility]   Showing recent visits within past 365 days and meeting all other requirements  Future Appointments  Date Type Provider Dept   12/02/23 Appointment Baldwin Bones, FNP [facility] [address] [facility]   02/29/24 Appointment Leticia Raven, Mariel Shope, FNP  [facility] [address] [facility]   Showing future appointments within next 365 days and meeting all other requirements       Getting request for Omprazole be sent to [facility].  Not listed as her pharmacy.  Checked with May Sparks, she does want RX sent to [facility].  She states she is not on Plavix, not currently pregnant or been pregnant in the last 12 months.  RX sent.

## 2023-12-02 ENCOUNTER — Ambulatory Visit: Admit: 2023-12-02 | Discharge: 2023-12-03 | Payer: Medicaid (Managed Care) | Attending: Family | Primary: Family

## 2023-12-02 DIAGNOSIS — E66811 Obesity (BMI 30.0-34.9): Principal | ICD-10-CM

## 2023-12-02 MED ORDER — METFORMIN 500 MG TABLET
ORAL_TABLET | Freq: Every day | ORAL | 1 refills | 90.00 days | Status: CP
Start: 2023-12-02 — End: 2025-01-05

## 2023-12-02 NOTE — Unmapped (Signed)
 Assessment and Plan:     Assessment & Plan  Weight gain  Weight increased from 150 to 157 pounds despite healthy lifestyle. Medications duloxetine , prednisone , and propranolol  may contribute, with propranolol  as a significant factor. Interested in weight loss medications; insurance may require oral trials before injectables. Metformin  proposed for potential insulin resistance and weight loss. Hemoglobin A1c may be needed for future injectable consideration.  - Initiate metformin  once daily with a meal.  - Reassess weight and medication efficacy in one month.  - Discuss propranolol 's impact on weight with neurologist.  - Review prednisone  use with rheumatologist.  - Consider hemoglobin A1c testing if injectable weight loss medications are pursued.    Hot flashes  Hot flashes possibly related to amenorrhea since 2023. Norethindrone  may influence cycle. Black cohosh recommended for hot flashes.  - Recommend black cohosh supplement.    Facial itching  Persistent facial pruritus without rash. Xerosis may contribute. No nighttime moisturizer used.  - Recommend mild facial cleanser such as Cetaphil, Cerave, or Neutrogena at night.  - Apply nighttime moisturizer like Cerave or Neutrogena after cleansing.             Obesity (BMI 30.0-34.9)  - metFORMIN  (GLUCOPHAGE ) 500 MG tablet; Take 1 tablet (500 mg total) by mouth in the morning.           At onset of visit I advised patient I am using a new tool to record our discussion today. The recording would write down what we discuss and once reviewed and finalized would become a part of the chart note for today's visit. Patient amenable to recording.     I personally spent 30 minutes face-to-face and non-face-to-face in the care of this patient, which includes all pre, intra, and post visit time on the date of service.    Return in about 4 weeks (around 12/30/2023) for Next scheduled follow up.    HPI:      Julia Bates is here for   Chief Complaint   Patient presents with Weight options     RD referral   Medications causing weight gain (duloxetine )          History of Present Illness  Julia Bates is a 38 year old female who presents with concerns about weight gain.    She has experienced a significant weight gain over the past two weeks, increasing from 150 pounds to 157 pounds. Despite efforts to manage her weight, including avoiding junk food and sodas, drinking mostly water, and engaging in 15 to 30 minutes of exercise regularly, her weight continues to rise. She started noticing weight gain a couple of months ago, which coincides with the initiation of propranolol . She is also on duloxetine  and prednisone , which she has taken without previous issues at low doses.    Her typical dietary intake includes drinking Ensure, which fills her up, leading her to skip meals until dinner. Dinner usually consists of a salad with lettuce, tomatoes, and feta cheese, and sometimes strawberries. She does not consume croutons or bacon bits. She is unsure of her daily caloric intake and does not use any apps to track it. Her lowest adult weight was 115 pounds, and she is currently wearing a size 10, whereas she desires to return to a size 3.    There is no known family history of weight issues. She does not wish to consult a nutritionist as she receives dietary advice from her nephew, who is knowledgeable about calorie and protein intake.  No recent changes in her diet or exercise routine. She experiences hot flashes and has not had a menstrual period since 2023.     ROS:      Comprehensive 10 point ROS negative unless otherwise stated in the HPI.      PCMH Components:     Medication adherence and barriers to the treatment plan have been addressed. Opportunities to optimize healthy behaviors have been discussed. Patient / caregiver voiced understanding.    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    ECC pos CIN 1--12/19    Essential hypertension 09/23/2022    GERD (gastroesophageal reflux disease) Oct 23    HPV (human papilloma virus) infection     Low back pain     Lupus     Lupus     Osteoporosis     Rheumatoid arthritis 11/18/2015    SLE (systemic lupus erythematosus related syndrome)      Past Surgical History:   Procedure Laterality Date    CERVICAL BIOPSY  W/ LOOP ELECTRODE EXCISION      COLPOSCOPY      SKIN BIOPSY         Family History:     Family History   Problem Relation Age of Onset    Glaucoma Father     Diabetes Father     Hypertension Father     Heart attack Father     Cataracts Father     Stroke Father     Neuropathy Mother     Osteoporosis Mother     No Known Problems Sister     Diabetes Sister     Diabetes Sister     Diabetes Sister     Diabetes Sister     Melanoma Neg Hx     Basal cell carcinoma Neg Hx     Squamous cell carcinoma Neg Hx     Macular degeneration Neg Hx     Blindness Neg Hx        Social History:     Social History     Tobacco Use    Smoking status: Never     Passive exposure: Never    Smokeless tobacco: Never    Tobacco comments:     None   Vaping Use    Vaping status: Never Used   Substance Use Topics    Alcohol use: Never    Drug use: Never       Allergies:     Sulfa (sulfonamide antibiotics)    Current Medications:     Current Outpatient Medications   Medication Sig Dispense Refill    calcium carbonate-vitamin D3 600 mg(1,500mg ) -200 unit per tablet Take 1 tablet by mouth Two (2) times a day.      diclofenac  sodium (VOLTAREN ) 1 % gel Apply 2 g topically four (4) times a day. 100 g 5    DULoxetine  (CYMBALTA ) 30 MG capsule Take 1 capsule (30 mg total) by mouth daily AND 2 capsules (60 mg total) nightly. 90 capsule 2    ergocalciferol , vitamin D2, (VITAMIN D2 ORAL) Take 1,000 Units by mouth daily with evening meal.      hydroxychloroquine  (PLAQUENIL ) 200 mg tablet Take 1.5 tablets (300 mg total) by mouth daily.  If unable to tolerate the smell of splitting tab - can take 200mg  on MWF, 400mg  other days of the week 135 tablet 3    hydrOXYzine  (ATARAX ) 25 MG tablet Take 1 tablet (25 mg total) by mouth every eight (8) hours as needed. 270 tablet 0    ipratropium (ATROVENT ) 42 mcg (0.06 %) nasal spray 2 sprays into each nostril Three (3) times a day. Use for 7-10 days. 10 mL 0    losartan  (COZAAR ) 50 MG tablet Take 1 tablet (50 mg total) by mouth daily. 90 tablet 3    melatonin 5 mg tablet Take 1 tablet (5 mg total) by mouth nightly.      mycophenolate  (CELLCEPT ) 500 mg tablet Take 2 tablets (1,000 mg total) by mouth two (2) times a day. 360 tablet 3    norethindrone  (MICRONOR ) 0.35 mg tablet Take 1 tablet by mouth daily. 84 tablet 3    omeprazole  (PRILOSEC) 20 MG capsule Take 1 capsule (20 mg total) by mouth daily. 90 capsule 2    oxyCODONE  (ROXICODONE ) 5 MG immediate release tablet Take 1 tablet (5 mg total) by mouth daily as needed for pain. 30 tablet 0    predniSONE  (DELTASONE ) 1 MG tablet Take 3 tablets (3 mg total) by mouth daily. (Patient taking differently: Take 2 tablets (2 mg total) by mouth daily.) 90 tablet 1    propranolol  (INDERAL ) 10 MG tablet Take 1 tablet (10 mg total) by mouth two (2) times a day. 60 tablet 3    tizanidine  (ZANAFLEX ) 2 MG tablet Take 1 tablet (2 mg total) by mouth every eight (8) hours as needed. 270 tablet 1    traZODone  (DESYREL ) 50 MG tablet Take 1 tablet (50 mg total) by mouth nightly. 90 tablet 3     No current facility-administered medications for this visit.       Health Maintenance:     Health Maintenance   Topic Date Due    Pneumococcal Vaccine 0-49 (3 of 3 - PPSV23, PCV20 or PCV21) 08/06/2020    COVID-19 Vaccine (3 - Pfizer risk series) 10/27/2020    Influenza Vaccine (Season Ended) 04/11/2024    Potassium Monitoring  06/01/2024    Serum Creatinine Monitoring  09/16/2024    DTaP/Tdap/Td Vaccines (4 - Td or Tdap) 04/09/2025    HPV Cotest with Pap Smear (21-65)  01/30/2028    Pap Smear with Cotest HPV (21-65)  02/04/2028    Hepatitis C Screen  Completed       Immunizations:     Immunization History   Administered Date(s) Administered    COVID-19 VAC,MRNA,TRIS(12Y UP)(PFIZER)(GRAY CAP) 09/08/2020    COVID-19 VACC,MRNA,(PFIZER)(PF) 09/29/2020    DTP 12/08/1990    HPV Quadrivalent (Gardasil) 03/24/2006, 06/01/2006, 10/02/2006    Influenza Vaccine Quad(IM)6 MO-Adult(PF) 06/05/2015, 05/06/2016, 07/07/2017, 07/06/2018, 04/29/2019, 05/17/2020    Influenza Virus Vaccine, unspecified formulation 05/06/2016, 07/07/2017    MMR 12/08/1990    Meningococcal C Conjugate 03/08/2004    Novel Influenza-h1n1-09, All Formulations 07/20/2008    PNEUMOCOCCAL POLYSACCHARIDE 23-VALENT 08/07/2015    PPD Test 05/20/2009    Pneumococcal Conjugate 13-Valent 04/10/2015    SHINGRIX-ZOSTER VACCINE (HZV),RECOMBINANT,ADJUVANTED(IM) 12/04/2021, 07/05/2022    TdaP 02/16/2012, 04/10/2015     I have reviewed and (if needed) updated the patient's problem list, medications, allergies, past medical and surgical history, social and family history.     Vital Signs:     Wt Readings from Last 3 Encounters:   12/02/23 71.2 kg (157 lb)   10/29/23 70.3 kg (  155 lb)   10/16/23 68.4 kg (150 lb 12.8 oz)     Temp Readings from Last 3 Encounters:   12/02/23 36.7 ??C (98.1 ??F) (Oral)   10/29/23 36 ??C (96.8 ??F) (Temporal)   10/16/23 36.3 ??C (97.3 ??F) (Temporal)     BP Readings from Last 3 Encounters:   12/02/23 110/80   10/29/23 105/76   10/06/23 118/82     Pulse Readings from Last 3 Encounters:   12/02/23 75   10/29/23 91   10/06/23 94     Estimated body mass index is 31.71 kg/m?? as calculated from the following:    Height as of this encounter: 149.9 cm (4' 11).    Weight as of this encounter: 71.2 kg (157 lb).  Facility age limit for growth %iles is 20 years.      Objective:       General: Alert and oriented x3. Well-appearing. No acute distress.   HEENT:  Normocephalic.  Atraumatic. Conjunctiva and sclera normal. OP MMM without lesions.   Neck:  Supple. No thyroid enlargement. No adenopathy.   Heart:  Regular rate and rhythm. Normal S1, S2. No murmurs, rubs or gallops.   Lungs:  No respiratory distress.  Lungs clear to auscultation. No wheezes, rhonchi, or rales.   GI/GU:  Soft, +BS, nondistended, non-TTP. No palpable masses or organomegaly.   Extremities:  No edema. Peripheral pulses normal.   Skin:  Warm, dry. No rash or lesions present.   Neuro:  Non-focal. No obvious weakness.   Psych:  Affect normal, eye contact good, speech clear and coherent.           Results          Laurence Pons, FNP

## 2023-12-02 NOTE — Unmapped (Signed)
 Black Cohosh for hot flashes.       Wash face nightly with a good facial cleanser such as Cetaphil, CeraVe or Neutrogena.  Then apply a facial moisturizer to your face. (Example of a good product-CeraVe PM)

## 2023-12-16 NOTE — Unmapped (Signed)
 error

## 2023-12-20 DIAGNOSIS — R7401 Transaminitis: Principal | ICD-10-CM

## 2023-12-20 DIAGNOSIS — M328 Other forms of systemic lupus erythematosus: Principal | ICD-10-CM

## 2023-12-23 NOTE — Unmapped (Signed)
 [facility] Refill Coordination Note    Specialty Medication(s) to be Shipped:   Inflammatory Disorders: mycophenolate     Other medication(s) to be shipped: No additional medications requested for fill at this time     Julia Bates, DOB: May 31, 1986  Phone: [phone] (home)       All above HIPAA information was verified with patient.     Was a Nurse, learning disability used for this call? No    Completed refill call assessment today to schedule patient's medication shipment from the [facility]  [phone]).  All relevant notes have been reviewed.     Specialty medication(s) and dose(s) confirmed: Regimen is correct and unchanged.   Changes to medications: [name] reports no changes at this time.  Changes to insurance: No  New side effects reported not previously addressed with a pharmacist or physician: None reported  Questions for the pharmacist: No    Confirmed patient received a Conservation officer, historic buildings and a Surveyor, mining with first shipment. The patient will receive a drug information handout for each medication shipped and additional FDA Medication Guides as required.       DISEASE/MEDICATION-SPECIFIC INFORMATION        N/A    SPECIALTY MEDICATION ADHERENCE     Medication Adherence    Patient reported X missed doses in the last month: 0  Specialty Medication: mycophenolate  500 mg tablet (CELLCEPT )  Patient is on additional specialty medications: No              Were doses missed due to medication being on hold? No      mycophenolate  500 mg tablet (CELLCEPT ): 4 days of medicine on hand       REFERRAL TO PHARMACIST     Referral to the pharmacist: Not needed      [facility]     Shipping address confirmed in Epic.     Cost and Payment: Patient has a copay of $4.00. They are aware and have authorized the pharmacy to charge the credit card on file.    Delivery Scheduled: Yes, Expected medication delivery date: 5.16.2025.     Medication will be delivered via Same Day Courier to the prescription address in Epic WAM.    Julia Bates   [facility]  Specialty Technician

## 2023-12-24 DIAGNOSIS — G8929 Other chronic pain: Principal | ICD-10-CM

## 2023-12-24 MED ORDER — OXYCODONE 5 MG TABLET
ORAL_TABLET | Freq: Every day | ORAL | 0 refills | 30.00000 days | PRN
Start: 2023-12-24 — End: ?

## 2023-12-25 MED FILL — MYCOPHENOLATE MOFETIL 500 MG TABLET: ORAL | 30 days supply | Qty: 120 | Fill #2

## 2023-12-27 MED ORDER — OXYCODONE 5 MG TABLET
ORAL_TABLET | Freq: Every day | ORAL | 0 refills | 30.00000 days | Status: CP | PRN
Start: 2023-12-27 — End: ?

## 2023-12-28 MED ORDER — DULOXETINE 30 MG CAPSULE,DELAYED RELEASE
ORAL_CAPSULE | Freq: Every evening | ORAL | 2 refills | 0.00000 days
Start: 2023-12-28 — End: ?

## 2023-12-30 ENCOUNTER — Encounter: Admit: 2023-12-30 | Discharge: 2023-12-31 | Payer: Worker's Compensation

## 2023-12-30 LAB — CBC W/ AUTO DIFF
BASOPHILS ABSOLUTE COUNT: 0.1 10*9/L (ref 0.0–0.1)
BASOPHILS RELATIVE PERCENT: 1.9 %
EOSINOPHILS ABSOLUTE COUNT: 0 10*9/L (ref 0.0–0.5)
EOSINOPHILS RELATIVE PERCENT: 0.7 %
HEMATOCRIT: 38.8 % (ref 34.0–44.0)
HEMOGLOBIN: 13.2 g/dL (ref 11.3–14.9)
LYMPHOCYTES ABSOLUTE COUNT: 1.5 10*9/L (ref 1.1–3.6)
LYMPHOCYTES RELATIVE PERCENT: 24.4 %
MEAN CORPUSCULAR HEMOGLOBIN CONC: 33.9 g/dL (ref 32.0–36.0)
MEAN CORPUSCULAR HEMOGLOBIN: 27.5 pg (ref 25.9–32.4)
MEAN CORPUSCULAR VOLUME: 81.1 fL (ref 77.6–95.7)
MEAN PLATELET VOLUME: 7.4 fL (ref 6.8–10.7)
MONOCYTES ABSOLUTE COUNT: 0.8 10*9/L (ref 0.3–0.8)
MONOCYTES RELATIVE PERCENT: 13.7 %
NEUTROPHILS ABSOLUTE COUNT: 3.6 10*9/L (ref 1.8–7.8)
NEUTROPHILS RELATIVE PERCENT: 59.3 %
PLATELET COUNT: 251 10*9/L (ref 150–450)
RED BLOOD CELL COUNT: 4.78 10*12/L (ref 3.95–5.13)
RED CELL DISTRIBUTION WIDTH: 13.3 % (ref 12.2–15.2)
WBC ADJUSTED: 6.1 10*9/L (ref 3.6–11.2)

## 2023-12-30 MED ORDER — DULOXETINE 30 MG CAPSULE,DELAYED RELEASE
ORAL_CAPSULE | Freq: Every evening | ORAL | 2 refills | 90.00000 days | Status: CP
Start: 2023-12-30 — End: ?

## 2023-12-30 MED ADMIN — diphenhydrAMINE (BENADRYL) injection 25 mg: 25 mg | INTRAVENOUS | @ 13:00:00 | Stop: 2023-12-30

## 2023-12-30 MED ADMIN — riTUXimab-abbs (TRUXIMA) 1,000 mg in sodium chloride (NS) 0.9 % 640 mL IVPB: 1000 mg | INTRAVENOUS | @ 13:00:00 | Stop: 2023-12-30

## 2023-12-30 MED ADMIN — acetaminophen (TYLENOL) tablet 650 mg: 650 mg | ORAL | @ 13:00:00 | Stop: 2023-12-30

## 2023-12-30 MED ADMIN — methylPREDNISolone sodium succinate (PF) (SOLU-Medrol) injection 125 mg: 125 mg | INTRAVENOUS | @ 13:00:00 | Stop: 2023-12-30

## 2023-12-30 NOTE — Unmapped (Signed)
 Patient presents for initial Truxima  (rituximab -abbs) infusion.  In no acute distress.  Vitals stable.  Reports no new medical issues or S/S of infection.  IV started.  See MAR for premeds.    WBC 6.1  PLT 251    0922 Truxima  (rituximab -abbs) 1000 mg infusing as follows:     50 mg/hr for 30 min   100 mg/hr for 30 min  150 mg/hr for 30 min  200 mg/hr for 30 min  250 mgl/hr for 30 min  300 mgl/hr for 30 min  350 mg/hr for 30 min  400 mg/hr for the rest of the infusion.    1350 Truxima  (rituximab -abbs) infusion complete. PIV flushed with NS.  Vitals stable.  Patient without any s/s of adverse reaction.    IV d/c'd.  Patient discharged from Infusion Center.

## 2024-01-01 ENCOUNTER — Ambulatory Visit: Admit: 2024-01-01 | Discharge: 2024-01-02 | Payer: Worker's Compensation | Attending: Family | Primary: Family

## 2024-01-01 DIAGNOSIS — E66811 Class 1 obesity due to excess calories with serious comorbidity and body mass index (BMI) of 31.0 to 31.9 in adult: Principal | ICD-10-CM

## 2024-01-01 DIAGNOSIS — Z6831 Body mass index (BMI) 31.0-31.9, adult: Principal | ICD-10-CM

## 2024-01-01 DIAGNOSIS — E6609 Other obesity due to excess calories: Principal | ICD-10-CM

## 2024-01-01 DIAGNOSIS — G4700 Insomnia, unspecified: Principal | ICD-10-CM

## 2024-01-01 MED ORDER — TRAZODONE 50 MG TABLET
ORAL_TABLET | Freq: Every evening | ORAL | 1 refills | 90.00000 days | Status: CP
Start: 2024-01-01 — End: 2024-12-31

## 2024-01-01 MED ORDER — SEMAGLUTIDE (WEIGHT LOSS) 0.25 MG/0.5 ML SUBCUTANEOUS PEN INJECTOR
SUBCUTANEOUS | 1 refills | 0.00000 days | Status: CP
Start: 2024-01-01 — End: ?

## 2024-01-01 NOTE — Unmapped (Signed)
 Assessment and Plan:     Assessment & Plan  Obesity (BMI 30.0-34.9)  Obesity with stable weight. Metformin  ineffective for weight loss. Discussed propranolol  and prednisone 's impact on weight. Interested in Mounjaro, potential insurance coverage, and side effects. Family history of thyroid cancer unclear. Explained prior authorization process.  - Order Mounjaro and initiate prior authorization.  - Advise monitoring for gastrointestinal side effects and report issues.  - Schedule follow-up in 3 months for weight check.  - Advise to contact office for dose adjustment if needed before follow-up.    Sleep disturbance  Reports early morning awakenings. Currently on low dose trazodone .  - Increase trazodone  to 100 mg at bedtime.  - Send new prescription to pharmacy.    Hot flashes  Continued hot flashes, previously tried black cohosh with some relief. Symptoms tolerable.  - Advise to report if symptoms become unbearable for further evaluation and treatment options.             Class 1 obesity due to excess calories with serious comorbidity and body mass index (BMI) of 31.0 to 31.9 in adult  - semaglutide , weight loss, (WEGOVY ) 0.25 mg/0.5 mL injection pen; Inject 0.25 mg under the skin every seven (7) days.    Insomnia, unspecified type  - traZODone  (DESYREL ) 50 MG tablet; Take 2 tablets (100 mg total) by mouth nightly.           At onset of visit I advised patient I am using a new tool to record our discussion today. The recording would write down what we discuss and once reviewed and finalized would become a part of the chart note for today's visit. Patient amenable to recording.     I personally spent 32 minutes face-to-face and non-face-to-face in the care of this patient, which includes all pre, intra, and post visit time on the date of service.    Return in about 3 months (around 04/02/2024) for Recheck weight and medication effectiveness .    HPI:      Julia Bates is here for   Chief Complaint   Patient presents with    Follow-up              History of Present Illness  Julia Bates is a 38 year old female who presents for follow-up on weight concerns.    She has been taking metformin  once daily since her last visit on December 02, 2023, but has not noticed any changes in her weight or appetite. Her weight remains at 157 pounds, the same as her previous visit. She consumes a bowl of cereal for breakfast, snacks like apples or fruits for lunch, and sometimes does not feel hungry by dinner, opting for a drink like Ensure instead.    She is currently on propranolol  for tremors and prednisone , managed by rheumatology, which she continues to take at a low dose of 2 mg. She has not yet had follow-up appointments with neurology or rheumatology since her last visit.    She experiences hot flashes and has tried black cohosh with some relief, though she continues to experience them. She finds them tolerable at this time.    She is experiencing sleep disturbances, waking up at 3 AM and unable to return to sleep until 5 AM. She is currently taking trazodone  at night for sleep.     ROS:      Comprehensive 10 point ROS negative unless otherwise stated in the HPI.      [facility] Components:  Medication adherence and barriers to the treatment plan have been addressed. Opportunities to optimize healthy behaviors have been discussed. Patient / caregiver voiced understanding.    Past Medical/Surgical History:     Past Medical History:   Diagnosis Date    Abnormal Pap smear of cervix 2010    Allergic     Sulfa    Cataract     Deep vein thrombosis   May 24 2022    Dysplasia of cervix, 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    ECC pos CIN 1--12/19    Essential hypertension 09/23/2022    GERD (gastroesophageal reflux disease) Oct 23    HPV (human papilloma virus) infection     Low back pain     Lupus     Lupus     Osteoporosis     Rheumatoid arthritis   11/18/2015    SLE (systemic lupus erythematosus related syndrome)        Past Surgical History:   Procedure Laterality Date    CERVICAL BIOPSY  W/ LOOP ELECTRODE EXCISION      COLPOSCOPY      SKIN BIOPSY         Family History:     Family History   Problem Relation Age of Onset    Glaucoma Father     Diabetes Father     Hypertension Father     Heart attack Father     Cataracts Father     Stroke Father     Neuropathy Mother     Osteoporosis Mother     No Known Problems Sister     Diabetes Sister     Diabetes Sister     Diabetes Sister     Diabetes Sister     Melanoma Neg Hx     Basal cell carcinoma Neg Hx     Squamous cell carcinoma Neg Hx     Macular degeneration Neg Hx     Blindness Neg Hx        Social History:     Social History     Tobacco Use    Smoking status: Never     Passive exposure: Never    Smokeless tobacco: Never    Tobacco comments:     None   Vaping Use    Vaping status: Never Used   Substance Use Topics    Alcohol use: Never    Drug use: Never       Allergies:     Sulfa (sulfonamide antibiotics)    Current Medications:     Current Outpatient Medications   Medication Sig Dispense Refill    calcium carbonate-vitamin D3 600 mg(1,500mg ) -200 unit per tablet Take 1 tablet by mouth Two (2) times a day.      diclofenac  sodium (VOLTAREN ) 1 % gel Apply 2 g topically four (4) times a day. 100 g 5    DULoxetine  (CYMBALTA ) 30 MG capsule TAKE 1 CAPSULE BY MOUTH NIGHTLY 90 capsule 2    ergocalciferol , vitamin D2, (VITAMIN D2 ORAL) Take 1,000 Units by mouth daily with evening meal.      hydroxychloroquine  (PLAQUENIL ) 200 mg tablet Take 1.5 tablets (300 mg total) by mouth daily. If unable to tolerate the smell of splitting tab - can take 200mg  on MWF, 400mg  other days of the week 135 tablet 3    hydrOXYzine  (ATARAX ) 25 MG tablet Take 1 tablet (25 mg total) by mouth every eight (8) hours as needed. 270 tablet 0    ipratropium (ATROVENT )  42 mcg (0.06 %) nasal spray 2 sprays into each nostril Three (3) times a day. Use for 7-10 days. 10 mL 0    losartan  (COZAAR ) 50 MG tablet Take 1 tablet (50 mg total) by mouth daily. 90 tablet 3    melatonin 5 mg tablet Take 1 tablet (5 mg total) by mouth nightly.      metFORMIN  (GLUCOPHAGE ) 500 MG tablet Take 1 tablet (500 mg total) by mouth in the morning. 90 tablet 1    mycophenolate  (CELLCEPT ) 500 mg tablet Take 2 tablets (1,000 mg total) by mouth two (2) times a day. 360 tablet 3    omeprazole  (PRILOSEC) 20 MG capsule Take 1 capsule (20 mg total) by mouth daily. 90 capsule 2    oxyCODONE  (ROXICODONE ) 5 MG immediate release tablet Take 1 tablet (5 mg total) by mouth daily as needed for pain. 30 tablet 0    predniSONE  (DELTASONE ) 1 MG tablet Take 3 tablets (3 mg total) by mouth daily. (Patient taking differently: Take 2 tablets (2 mg total) by mouth daily.) 90 tablet 1    propranolol  (INDERAL ) 10 MG tablet Take 1 tablet (10 mg total) by mouth two (2) times a day. 60 tablet 3    tizanidine  (ZANAFLEX ) 2 MG tablet Take 1 tablet (2 mg total) by mouth every eight (8) hours as needed. 270 tablet 1    traZODone  (DESYREL ) 50 MG tablet Take 1 tablet (50 mg total) by mouth nightly. 90 tablet 3    norethindrone  (MICRONOR ) 0.35 mg tablet Take 1 tablet by mouth daily. 84 tablet 3     No current facility-administered medications for this visit.       Health Maintenance:     Health Maintenance   Topic Date Due    Pneumococcal Vaccine 0-49 (3 of 3 - PPSV23, PCV20 or PCV21) 08/06/2020    COVID-19 Vaccine (3 - Pfizer risk series) 10/27/2020    Influenza Vaccine (Season Ended) 04/11/2024    Potassium Monitoring  06/01/2024    Serum Creatinine Monitoring  09/16/2024    DTaP/Tdap/Td Vaccines (4 - Td or Tdap) 04/09/2025    HPV Cotest with Pap Smear (21-65)  01/30/2028    Pap Smear with Cotest HPV (21-65)  02/04/2028    Hepatitis C Screen  Completed       Immunizations:     Immunization History   Administered Date(s) Administered    COVID-19 VAC,MRNA,TRIS(12Y UP)(PFIZER)(GRAY CAP) 09/08/2020    COVID-19 VACC,MRNA,(PFIZER)(PF) 09/29/2020    DTP 12/08/1990    HPV Quadrivalent (Gardasil) 03/24/2006, 06/01/2006, 10/02/2006    Influenza Vaccine Quad(IM)6 MO-Adult(PF) 06/05/2015, 05/06/2016, 07/07/2017, 07/06/2018, 04/29/2019, 05/17/2020    Influenza Virus Vaccine, unspecified formulation 05/06/2016, 07/07/2017    MMR 12/08/1990    Meningococcal C Conjugate 03/08/2004    Novel Influenza-h1n1-09, All Formulations 07/20/2008    PNEUMOCOCCAL POLYSACCHARIDE 23-VALENT 08/07/2015    PPD Test 05/20/2009    Pneumococcal Conjugate 13-Valent 04/10/2015    SHINGRIX-ZOSTER VACCINE (HZV),RECOMBINANT,ADJUVANTED(IM) 12/04/2021, 07/05/2022    TdaP 02/16/2012, 04/10/2015     I have reviewed and (if needed) updated the patient's problem list, medications, allergies, past medical and surgical history, social and family history.     Vital Signs:     Wt Readings from Last 3 Encounters:   01/01/24 71.4 kg (157 lb 4.8 oz)   12/30/23 71.2 kg (157 lb)   12/02/23 71.2 kg (157 lb)     Temp Readings from Last 3 Encounters:   01/01/24 37.1 ??C (98.7 ??F)   12/30/23 36.3 ??  C (97.3 ??F) (Temporal)   12/02/23 36.7 ??C (98.1 ??F) (Oral)     BP Readings from Last 3 Encounters:   01/01/24 121/83   12/30/23 127/98   12/02/23 110/80     Pulse Readings from Last 3 Encounters:   01/01/24 77   12/30/23 88   12/02/23 75     Estimated body mass index is 31.77 kg/m?? as calculated from the following:    Height as of this encounter: 149.9 cm (4' 11).    Weight as of this encounter: 71.4 kg (157 lb 4.8 oz).  Facility age limit for growth %iles is 20 years.      Objective:       General: Alert and oriented x3. Well-appearing. No acute distress.   HEENT:  Normocephalic.  Atraumatic. Conjunctiva and sclera normal. OP MMM without lesions.   Neck:  Supple. No thyroid enlargement. No adenopathy.   Heart:  Regular rate and rhythm. Normal S1, S2. No murmurs, rubs or gallops.   Lungs:  No respiratory distress.  Lungs clear to auscultation. No wheezes, rhonchi, or rales.   GI/GU:  Soft, +BS, nondistended, non-TTP. No palpable masses or organomegaly.   Extremities:  No edema. Peripheral pulses normal.   Skin:  Warm, dry. No rash or lesions present.   Neuro:  Non-focal. No obvious weakness.   Psych:  Affect normal, eye contact good, speech clear and coherent.           Results          Laurence Pons, FNP

## 2024-01-03 DIAGNOSIS — M3219 Other organ or system involvement in systemic lupus erythematosus: Principal | ICD-10-CM

## 2024-01-03 MED ORDER — HYDROXYCHLOROQUINE 200 MG TABLET
ORAL_TABLET | 0 refills | 0.00000 days
Start: 2024-01-03 — End: ?

## 2024-01-05 MED ORDER — HYDROXYCHLOROQUINE 200 MG TABLET
ORAL_TABLET | ORAL | 3 refills | 0.00000 days | Status: CP
Start: 2024-01-05 — End: ?

## 2024-01-05 NOTE — Unmapped (Signed)
 HCQ refill  Last ov: 09/17/2023  Next ov: 02/09/2024

## 2024-01-08 ENCOUNTER — Ambulatory Visit
Admit: 2024-01-08 | Discharge: 2024-01-09 | Payer: Worker's Compensation | Attending: Retina Specialist | Primary: Retina Specialist

## 2024-01-08 DIAGNOSIS — H52203 Unspecified astigmatism, bilateral: Principal | ICD-10-CM

## 2024-01-08 DIAGNOSIS — H5213 Myopia, bilateral: Principal | ICD-10-CM

## 2024-01-08 DIAGNOSIS — Z79899 Other long term (current) drug therapy: Principal | ICD-10-CM

## 2024-01-08 NOTE — Unmapped (Signed)
 PA WEGOVY  INITIATED VIA COVER MY MEDS

## 2024-01-08 NOTE — Unmapped (Signed)
 [name] is here for screening for Plaquenil  retinopathy, referred by Julia Bones, FNP.    Assessment:       1. Long-term use of plaquenil  for lupus  No evidence of plaquenil  toxicity    Duration of use: 15 years, 300 mg daily PO. This is equivalent to 4.2 mg/kg/day.    Risk factors: No liver disease. No renal dysfunction. No history of use of tamoxifen    I reviewed the patient's current tests to see if there were signs of retinal toxicity.  The tests done provide non-duplicative information to detect signs of retinal toxicity in terms of retinal structure and function.    Clinical findings: Within normal limits     Fundus autofluorescence (FAF):  Within normal limits    Spectral domain optical coherence tomography (SD-OCT): Within normal limits   Humphrey visual field 10-2 :Within normal limits     Overall, my evaluation today indicates that the patient has no retinal toxicity due to plaquenil .    2. Myopic astigmatism OU  - stable. Observe        Plan:       I recommend that we see the patient again in 1 year to repeat the evaluation with clinical exam, FAF, SD-OCT.  No follow-ups on file.  or sooner PRN.

## 2024-01-13 ENCOUNTER — Encounter: Admit: 2024-01-13 | Discharge: 2024-01-14 | Payer: Medicaid (Managed Care)

## 2024-01-13 LAB — CBC W/ AUTO DIFF
BASOPHILS ABSOLUTE COUNT: 0 10*9/L (ref 0.0–0.1)
BASOPHILS RELATIVE PERCENT: 0.5 %
EOSINOPHILS ABSOLUTE COUNT: 0.1 10*9/L (ref 0.0–0.5)
EOSINOPHILS RELATIVE PERCENT: 1.2 %
HEMATOCRIT: 40.2 % (ref 34.0–44.0)
HEMOGLOBIN: 13.2 g/dL (ref 11.3–14.9)
LYMPHOCYTES ABSOLUTE COUNT: 1.1 10*9/L (ref 1.1–3.6)
LYMPHOCYTES RELATIVE PERCENT: 24.4 %
MEAN CORPUSCULAR HEMOGLOBIN CONC: 32.9 g/dL (ref 32.0–36.0)
MEAN CORPUSCULAR HEMOGLOBIN: 26.8 pg (ref 25.9–32.4)
MEAN CORPUSCULAR VOLUME: 81.4 fL (ref 77.6–95.7)
MEAN PLATELET VOLUME: 7.4 fL (ref 6.8–10.7)
MONOCYTES ABSOLUTE COUNT: 0.7 10*9/L (ref 0.3–0.8)
MONOCYTES RELATIVE PERCENT: 14 %
NEUTROPHILS ABSOLUTE COUNT: 2.8 10*9/L (ref 1.8–7.8)
NEUTROPHILS RELATIVE PERCENT: 59.9 %
PLATELET COUNT: 239 10*9/L (ref 150–450)
RED BLOOD CELL COUNT: 4.94 10*12/L (ref 3.95–5.13)
RED CELL DISTRIBUTION WIDTH: 13.5 % (ref 12.2–15.2)
WBC ADJUSTED: 4.7 10*9/L (ref 3.6–11.2)

## 2024-01-13 MED ADMIN — acetaminophen (TYLENOL) tablet 650 mg: 650 mg | ORAL | @ 16:00:00 | Stop: 2024-01-13

## 2024-01-13 MED ADMIN — diphenhydrAMINE (BENADRYL) injection 25 mg: 25 mg | INTRAVENOUS | @ 16:00:00 | Stop: 2024-01-13

## 2024-01-13 MED ADMIN — methylPREDNISolone sodium succinate (SOLU-Medrol) injection 125 mg: 125 mg | INTRAVENOUS | @ 16:00:00 | Stop: 2024-01-13

## 2024-01-13 MED ADMIN — riTUXimab-abbs (TRUXIMA) 1,000 mg in sodium chloride (NS) 0.9 % 640 mL IVPB: 1000 mg | INTRAVENOUS | @ 17:00:00 | Stop: 2024-01-13

## 2024-01-13 NOTE — Unmapped (Signed)
 Patient presents for subsequent  Truxima  (rituximab -abbs) infusion. In no acute distress.  Vitals stable. Tolerated infusion 2 weeks ago. Reports no new medical issues or S/S of infection.  PIV started to right hand, labs drawn and line flushed. CBC taken to lab to be run stat.     Lab results as follows:  WBC- 4.7  PLT 239     See MAR for premeds.  1240Truxima (rituximab -abbs) 1000 mg started to infuse as follows:   100 mg /hr for 30 min  200 mg/hr for 30 min  300 mg/hr for 30 min  400 mg/hr for the rest of the infusion.  1340- patient sleeping  1430 up to restroom, no complaints  1530- on tablet, tolerating infusion well.     1607 Truxima  (rituximab -abbs) infusion complete. PIV flushed with NS.  Vitals stable.  Patient without any s/s of adverse reaction.IV d/c'd.  Patient discharged from Infusion Center.

## 2024-01-20 DIAGNOSIS — L299 Pruritus, unspecified: Principal | ICD-10-CM

## 2024-01-20 MED ORDER — HYDROXYZINE HCL 25 MG TABLET
ORAL_TABLET | Freq: Three times a day (TID) | ORAL | 0 refills | 90.00000 days | Status: CP | PRN
Start: 2024-01-20 — End: ?

## 2024-01-20 NOTE — Unmapped (Signed)
 Hydroxyzine  refill  Last ov: 09/17/2023  Next ov: 02/09/2024

## 2024-01-21 NOTE — Unmapped (Signed)
 [facility] Refill Coordination Note    Specialty Medication(s) to be Shipped:   Inflammatory Disorders: mycophenolate     Other medication(s) to be shipped: No additional medications requested for fill at this time     Julia Bates, DOB: Apr 21, 1986  Phone: [phone] (home)       All above HIPAA information was verified with patient.     Was a Nurse, learning disability used for this call? No    Completed refill call assessment today to schedule patient's medication shipment from the [facility]  [phone]).  All relevant notes have been reviewed.     Specialty medication(s) and dose(s) confirmed: Regimen is correct and unchanged.   Changes to medications: Julia Bates reports no changes at this time.  Changes to insurance: No  New side effects reported not previously addressed with a pharmacist or physician: None reported  Questions for the pharmacist: No    Confirmed patient received a Conservation officer, historic buildings and a Surveyor, mining with first shipment. The patient will receive a drug information handout for each medication shipped and additional FDA Medication Guides as required.       DISEASE/MEDICATION-SPECIFIC INFORMATION        N/A    SPECIALTY MEDICATION ADHERENCE     Medication Adherence    Specialty Medication: mycophenolate  500 mg tablet (CELLCEPT )  Patient is on additional specialty medications: No              Were doses missed due to medication being on hold? No      mycophenolate  500 mg tablet (CELLCEPT ): 10 days of medicine on hand       REFERRAL TO PHARMACIST     Referral to the pharmacist: Not needed      [facility]     Shipping address confirmed in Epic.     Cost and Payment: Patient has a copay of $4.00. They are aware and have authorized the pharmacy to charge the credit card on file.    Delivery Scheduled: Yes, Expected medication delivery date: 6.18.25.     Medication will be delivered via Next Day Courier to the prescription address in Epic WAM.    Julia Bates   [facility]  Specialty Technician

## 2024-01-22 DIAGNOSIS — M7918 Myalgia, other site: Principal | ICD-10-CM

## 2024-01-22 MED ORDER — TIZANIDINE 2 MG TABLET
ORAL_TABLET | 5 refills | 0.00000 days
Start: 2024-01-22 — End: ?

## 2024-01-23 DIAGNOSIS — M7918 Myalgia, other site: Principal | ICD-10-CM

## 2024-01-23 MED ORDER — TIZANIDINE 2 MG TABLET
ORAL_TABLET | Freq: Three times a day (TID) | ORAL | 1 refills | 90.00000 days | PRN
Start: 2024-01-23 — End: ?

## 2024-01-24 MED ORDER — PROPRANOLOL 10 MG TABLET
ORAL_TABLET | Freq: Two times a day (BID) | ORAL | 3 refills | 30.00000 days
Start: 2024-01-24 — End: 2025-02-27

## 2024-01-25 MED ORDER — TIZANIDINE 2 MG TABLET
ORAL_TABLET | Freq: Three times a day (TID) | ORAL | 0 refills | 90.00000 days | Status: CP | PRN
Start: 2024-01-25 — End: ?

## 2024-01-25 MED ORDER — PROPRANOLOL 10 MG TABLET
ORAL_TABLET | Freq: Two times a day (BID) | ORAL | 3 refills | 30.00000 days | Status: CP
Start: 2024-01-25 — End: 2025-02-28

## 2024-01-25 NOTE — Unmapped (Signed)
 Refill request received from patient.      Medication Requested: Propranolol  10 mg  Last Office Visit: 10/29/2023   Next Office Visit: 01/29/2024  Last Prescriber: Dr. Almodovar    Nurse refill requirements met? No  If not met, why: Warning upon approval    Sent to: Provider for signing  If sent to provider, which provider?: Dr. Almodovar

## 2024-01-26 DIAGNOSIS — G8929 Other chronic pain: Principal | ICD-10-CM

## 2024-01-26 MED ORDER — OXYCODONE 5 MG TABLET
ORAL_TABLET | Freq: Every day | ORAL | 0 refills | 30.00000 days | PRN
Start: 2024-01-26 — End: ?

## 2024-01-26 NOTE — Unmapped (Signed)
 Julia Bates 's Mycophenolate   shipment will be delayed as a result of credit card ending in 1351 declined     I have reached out to the patient  at [phone] and left a voicemail message.  We will wait for a call back from the patient to reschedule the delivery.  We have not confirmed the new delivery date.

## 2024-01-26 NOTE — Unmapped (Signed)
 Oxycodone refill  Last ov: 09/17/2023  Next ov: 02/09/2024

## 2024-01-27 MED ORDER — OXYCODONE 5 MG TABLET
ORAL_TABLET | Freq: Every day | ORAL | 0 refills | 30.00000 days | Status: CP | PRN
Start: 2024-01-27 — End: ?

## 2024-01-28 NOTE — Unmapped (Signed)
 [facility] Summary     [address]  [facility] [address] [place]  [address]  [place] [address]  [phone]    Date: 01/29/2024  Patient Name: Julia Bates  MRN: [id number]  PCP: Geralene Levorn Waddell Serena, Ronnald Norris,*            Julia Bates is a 38 y.o. right handed female seen in consultation at the [facility] at the request of Dr. Serena for evaluation of tremor.      Assessment & Plan:      Diagnosis ICD-10-CM Associated Orders   1. Tremor  R25.1 primidone  (MYSOLINE ) 50 MG tablet          Assessment & Plan  Tremor  Tremor affects right hand, worsens with action. Propranolol  minimally effective, causes weight gain. Chronic steroid use for lupus may contribute to tremor. Normal TSH. Archimedes spiral today was inconclusive. Ddx: steroid-induced tremor, physiologic tremor, essential tremor. Normal exam today. We will trial of essential tremor med as this symptom bothers her.   - Stop propranolol   - Initiate primidone  25 mg at bedtime, increase to 50 mg nightly. Counseled on side effects. Can increase it if needed    RTC in 3 months or sooner if needed      Thank you very much for this consultation. Please let us  know if any questions or concerns.     Mona Madalyn Jock, FNP    Note forwarded to Dr. Almodovar for review.    I personally spent 40 minutes face-to-face and non-face-to-face in the care of this patient, which includes all pre, intra, and post visit time on the date of service.  All documented time was specific to the E/M visit and does not include any procedures that may have been performed.      CC: Serena Ronnald Norris,*    Subjective:     Chief Concern: Julia Bates is a 38 y.o. female who comes for consultation and evaluation from Geralene Levorn Waddell, FNP regarding essential tremor.    The history is obtained from the medical record and patient.    History of Present Illness:     01/29/2024  History of Present Illness  Julia Bates is a 38 year old female with lupus and chronic steroid use who presents with tremor.    She experiences a tremor primarily in her right hand, which worsens with actions such as handing objects or eating with a fork. The tremor occurs about three to four times per week and can start spontaneously. She is currently taking propranolol  10 mg twice daily for the tremor, which she started in March, but notes only slight improvement. She has gained four pounds since starting the medication. She has previously tried gabapentin  for pain, which did not help with the tremor.    She has a history of lupus and has been on chronic prednisone  since she was diagnosed at age 52. She was hospitalized in July for pericarditis, which was identified as fluid around her heart, but she continues to follow up with cardiology.     TSH normal    Note from Dr. Almodovar 10/29/2023  She has been experiencing a tremor in both hands for the past couple of months, with the right hand being more affected. The tremor sometimes interferes with activities such as drinking from a cup and using utensils, but there are  no issues with writing. It becomes more pronounced when handing objects to others. No vivid dreams, acting out dreams, trouble with swallowing or talking, or moving slowly. She mentions a history of falls a couple of months ago but does not report frequent falls.     Her past medical history is significant for lupus, for which she has been on long-term prednisone  since she was 38 years old. She notes that prednisone  can exacerbate her tremors.     She denies any family history of similar tremors.      Outside records and prior workup has been reviewed (see Media section).     Past Medical History: She  has a past medical history of Abnormal Pap smear of cervix (2010), Allergic, Cataract, Deep vein thrombosis (May 24 2022), Dysplasia of cervix, low grade (CIN 1) (08/10/2018), Essential hypertension (09/23/2022), GERD (gastroesophageal reflux disease) (Oct 23), HPV (human papilloma virus) infection, Low back pain, Lupus, Lupus, Osteoporosis, Pericarditis (HHS-HCC), Rheumatoid arthritis (11/18/2015), and SLE (systemic lupus erythematosus related syndrome).    Past Surgical History: Past Surgical History[1]    Family History: Her family history includes Cataracts in her father; Diabetes in her father, sister, sister, sister, and sister; Glaucoma in her father; Heart attack in her father; Hypertension in her father; Neuropathy in her mother; No Known Problems in her sister; Osteoporosis in her mother; Stroke in her father.    Social history: She  reports that she has never smoked. She has never been exposed to tobacco smoke. She has never used smokeless tobacco. She reports that she does not drink alcohol and does not use drugs.     Medication Reconciliation: In conjunction with entering the patient's initial or transfer medications, I performed medication reconciliation based on information available to me at the time.    Past Medical Hx, Family Hx, Social Hx, and Problem List has been reviewed in the [employer].    REVIEW OF SYSTEMS: 11+ review of systems was reviewed. Pertinent positives are detailed in the HPI.     Objective:     Vital Signs: Her height is 149.9 cm (4' 11) and weight is 73.5 kg (162 lb). Her blood pressure is 105/79 and her pulse is 64.   Pain score-       01/29/24 1059   PainSc: 0-No pain   /10      Eyes:      General: Lids are normal.      Extraocular Movements: Extraocular movements intact.   Neurological:      Mental Status: Oriented to person, place and time.      Coordination: Romberg sign negative.      Deep Tendon Reflexes:      Reflex Scores:       Tricep reflexes are 2+ on the right side and 2+ on the left side.       Bicep reflexes are 2+ on the right side and 2+ on the left side. Brachioradialis reflexes are 2+ on the right side and 2+ on the left side.       Patellar reflexes are 2+ on the right side and 2+ on the left side.       Achilles reflexes are 2+ on the right side and 2+ on the left side.  Psychiatric:         Speech: Speech normal.             Neurological Exam  Mental Status  Awake, alert and oriented to person, place and time. Oriented  to person, place and time. Speech is normal. Language is fluent with no aphasia.    Cranial Nerves  CN II: Visual acuity is normal. Visual fields full to confrontation.  CN III, IV, VI: Extraocular movements intact bilaterally. Normal lids and orbits bilaterally.   Right pupil: 4 mm. Round. Reactive to light. Reactive to accommodation.   Left pupil: 4 mm. Round. Reactive to light. Reactive to accommodation.  CN V: Facial sensation is normal.  CN VII: Full and symmetric facial movement.  CN VIII: Hearing is normal.  CN IX, X: Palate elevates symmetrically. Normal gag reflex.  CN XI:  Right: Sternocleidomastoid strength is normal. Trapezius strength is normal.  Left: Sternocleidomastoid strength is normal. Trapezius strength is normal.  CN XII: Tongue midline without atrophy or fasciculations.    Motor  Normal muscle bulk throughout. No fasciculations present. Normal muscle tone. No pronator drift.                                             Right                     Left  Neck flexion                           5                           Neck extension                      5                            Shoulder adduction               5                          5  Elbow flexion                         5                          5  Elbow extension                    5                          5  Wrist flexion                           5                          5  Wrist extension                      5                          5  Finger flexion  5                          5  Finger extension                    5 5  Finger abduction                    5                          5  Thumb abduction                   5                          5  Hip flexion                              5                          5  Knee flexion                           5                          5  Knee extension                      5                          5  Ankle inversion                      5                          5  Ankle eversion                       5                          5  Plantarflexion                         5                          5  Dorsiflexion                            5                          5  No tremor noted today.    Sensory  Light touch is normal in upper and lower extremities. Pinprick is normal in upper and lower extremities. Vibration is normal in upper and lower extremities. Proprioception is normal in upper and lower extremities.     Reflexes  Right                      Left  Brachioradialis                    2+                         2+  Biceps                                 2+                         2+  Triceps                                2+                         2+  Patellar                                2+                         2+  Achilles                                2+                         2+  Right Plantar: downgoing  Left Plantar: downgoing    Right pathological reflexes: Hoffmann's absent.  Left pathological reflexes: Hoffmann's absent.    Coordination  Right: Finger-to-nose normal. Heel-to-shin normal.Left: Finger-to-nose normal. Heel-to-shin normal.    Gait  Casual gait is normal including stance, stride, and arm swing. Normal tandem gait. Romberg is absent. Able to rise from chair without using arms.                 Diagnostic Studies and Review of Records   Labs:  Lab Results   Component Value Date    Hemoglobin A1C 5.3 08/13/2010    TSH 4.346 10/29/2023    TSH 1.986 02/21/2022    TSH 2.633 07/27/2020    TSH 1.21 08/13/2010    Free T4 0.85 07/06/2018    Ferritin 321.0 (H) 07/06/2018    Ferritin 59.3 07/07/2016    Antinuclear Antibodies (ANA) Positive (A) 05/21/2022    Antinuclear Antibodies (ANA) POSITIVE (AB) 08/15/2010    ENA Screen 1.00 (H) 07/06/2018    Zinc 0.60 (L) 07/07/2016    HIV Antigen/Antibody Combo Nonreactive 10/30/2021    HIV Antigen/Antibody Combo Nonreactive 04/11/2014    RPR Nonreactive 10/30/2021    Hepatitis C Ab Nonreactive 05/21/2022    Hepatitis C Ab Negative 10/19/2014    Hep B Surface Ag Nonreactive 05/21/2022    Hepatitis B Surface Ag Negative 10/19/2014       Lab Results   Component Value Date/Time    RF 6.0 05/21/2022 01:55 AM    ESR 9 10/17/2022 08:22 PM    ESR 30 (H) 07/25/2022 03:14 PM    ESR 27 (H) 05/20/2022 09:12 PM    ESR 15 04/11/2014 01:44 PM  ESR 4 04/08/2013 02:23 PM    ESR 8 08/14/2010 05:14 AM    CRP 10.0 10/17/2022 08:22 PM    CRP 28.0 (H) 07/25/2022 03:14 PM    CRP 90.0 (H) 05/20/2022 09:12 PM    CRP 2.0 (H) 04/11/2014 01:44 PM    CRP 0.7 04/08/2013 02:23 PM    CRP <0.5 08/13/2010 05:49 PM    DSDNAAB Negative 09/17/2023 11:51 AM    DSDNAAB NEGATIVE 04/11/2014 01:44 PM       Imaging and procedure:                             [1]   Past Surgical History:  Procedure Laterality Date    CERVICAL BIOPSY  W/ LOOP ELECTRODE EXCISION      COLPOSCOPY      SKIN BIOPSY

## 2024-01-29 ENCOUNTER — Ambulatory Visit: Admit: 2024-01-29 | Discharge: 2024-01-30 | Payer: Medicaid (Managed Care)

## 2024-01-29 DIAGNOSIS — R251 Tremor, unspecified: Principal | ICD-10-CM

## 2024-01-29 MED ORDER — PRIMIDONE 50 MG TABLET
ORAL_TABLET | Freq: Every evening | ORAL | 2 refills | 60.00000 days | Status: CP
Start: 2024-01-29 — End: 2025-01-28

## 2024-01-29 NOTE — Unmapped (Signed)
 patient left my chart message requesting an appt for annual or follow up

## 2024-01-29 NOTE — Unmapped (Addendum)
-   stop propranolol   - Primidone  titration schedule:   Week 1: 25 mg at bedtime  Week 2 and on: 50 mg at bedtime  Can make you sleepy and drowsy.   If you encounter side effects then return to last good dose and contact me via Mychart.      Please use additional barrier birth control method given this med can reduce the effectiveness of your oral contraceptive.

## 2024-02-01 NOTE — Unmapped (Signed)
 Julia Bates 's Mycophenolate  shipment will be rescheduled as a result of credit card on file and copay collected.     I have reached out to the patient  via incoming phone call and communicated the delivery change. We will reschedule the medication for the delivery date that the patient agreed upon.  We have confirmed the delivery date as 02/03/24, via next day courier.

## 2024-02-02 MED FILL — MYCOPHENOLATE MOFETIL 500 MG TABLET: ORAL | 30 days supply | Qty: 120 | Fill #3

## 2024-02-09 ENCOUNTER — Encounter
Admit: 2024-02-09 | Discharge: 2024-02-10 | Payer: Medicaid (Managed Care) | Attending: Internal Medicine | Primary: Internal Medicine

## 2024-02-09 ENCOUNTER — Ambulatory Visit
Admit: 2024-02-09 | Discharge: 2024-02-10 | Payer: Medicaid (Managed Care) | Attending: Internal Medicine | Primary: Internal Medicine

## 2024-02-09 DIAGNOSIS — M3212 Pericarditis in systemic lupus erythematosus: Principal | ICD-10-CM

## 2024-02-09 DIAGNOSIS — E559 Vitamin D deficiency, unspecified: Principal | ICD-10-CM

## 2024-02-09 DIAGNOSIS — M328 Other forms of systemic lupus erythematosus: Principal | ICD-10-CM

## 2024-02-09 LAB — URINALYSIS WITH MICROSCOPY WITH CULTURE REFLEX PERFORMABLE
BACTERIA: NONE SEEN /HPF
BILIRUBIN UA: NEGATIVE
BLOOD UA: NEGATIVE
GLUCOSE UA: NEGATIVE
KETONES UA: NEGATIVE
LEUKOCYTE ESTERASE UA: NEGATIVE
NITRITE UA: NEGATIVE
PH UA: 6 (ref 5.0–9.0)
RBC UA: 1 /HPF (ref <=4)
SPECIFIC GRAVITY UA: 1.019 (ref 1.003–1.030)
SQUAMOUS EPITHELIAL: 1 /HPF (ref 0–5)
UROBILINOGEN UA: <2
WBC UA: <1 /HPF (ref 0–5)

## 2024-02-09 LAB — COMPREHENSIVE METABOLIC PANEL
ALBUMIN: 3.8 g/dL (ref 3.4–5.0)
ALKALINE PHOSPHATASE: 127 U/L — ABNORMAL HIGH (ref 46–116)
ALT (SGPT): 38 U/L (ref 10–49)
ANION GAP: 9 mmol/L (ref 5–14)
AST (SGOT): 30 U/L (ref <=34)
BILIRUBIN TOTAL: 0.3 mg/dL (ref 0.3–1.2)
BLOOD UREA NITROGEN: 10 mg/dL (ref 9–23)
BUN / CREAT RATIO: 12
CALCIUM: 9 mg/dL (ref 8.7–10.4)
CHLORIDE: 106 mmol/L (ref 98–107)
CO2: 25.4 mmol/L (ref 20.0–31.0)
CREATININE: 0.81 mg/dL (ref 0.55–1.02)
EGFR CKD-EPI (2021) FEMALE: >90 mL/min/1.73m2 (ref >=60)
GLUCOSE RANDOM: 95 mg/dL (ref 70–179)
POTASSIUM: 3.8 mmol/L (ref 3.4–4.8)
PROTEIN TOTAL: 6.6 g/dL (ref 5.7–8.2)
SODIUM: 140 mmol/L (ref 135–145)

## 2024-02-09 LAB — CBC W/ AUTO DIFF
BASOPHILS ABSOLUTE COUNT: 0 10*9/L (ref 0.0–0.1)
BASOPHILS RELATIVE PERCENT: 0.6 %
EOSINOPHILS ABSOLUTE COUNT: 0 10*9/L (ref 0.0–0.5)
EOSINOPHILS RELATIVE PERCENT: 0.4 %
HEMATOCRIT: 38 % (ref 34.0–44.0)
HEMOGLOBIN: 12.6 g/dL (ref 11.3–14.9)
LYMPHOCYTES ABSOLUTE COUNT: 1 10*9/L — ABNORMAL LOW (ref 1.1–3.6)
LYMPHOCYTES RELATIVE PERCENT: 18.6 %
MEAN CORPUSCULAR HEMOGLOBIN CONC: 33.1 g/dL (ref 32.0–36.0)
MEAN CORPUSCULAR HEMOGLOBIN: 27 pg (ref 25.9–32.4)
MEAN CORPUSCULAR VOLUME: 81.5 fL (ref 77.6–95.7)
MEAN PLATELET VOLUME: 7.4 fL (ref 6.8–10.7)
MONOCYTES ABSOLUTE COUNT: 0.6 10*9/L (ref 0.3–0.8)
MONOCYTES RELATIVE PERCENT: 11.9 %
NEUTROPHILS ABSOLUTE COUNT: 3.6 10*9/L (ref 1.8–7.8)
NEUTROPHILS RELATIVE PERCENT: 68.5 %
PLATELET COUNT: 249 10*9/L (ref 150–450)
RED BLOOD CELL COUNT: 4.67 10*12/L (ref 3.95–5.13)
RED CELL DISTRIBUTION WIDTH: 13.7 % (ref 12.2–15.2)
WBC ADJUSTED: 5.3 10*9/L (ref 3.6–11.2)

## 2024-02-09 LAB — C4 COMPLEMENT: C4 COMPLEMENT: 17.9 mg/dL (ref 12.0–36.0)

## 2024-02-09 LAB — PROTEIN / CREATININE RATIO, URINE
CREATININE, URINE: 129.8 mg/dL
PROTEIN URINE: 35.9 mg/dL
PROTEIN/CREAT RATIO, URINE: 0.277

## 2024-02-09 LAB — TSH: THYROID STIMULATING HORMONE: 4.093 u[IU]/mL (ref 0.550–4.780)

## 2024-02-09 NOTE — Unmapped (Signed)
 Addended by: Mekai Wilkinson Z on: 02/09/2024 05:45 PM     Modules accepted: Level of Service

## 2024-02-09 NOTE — Unmapped (Addendum)
 Assessment/Plan:   Julia Bates is a 38 y.o. female with history of SLE who returns today for follow up. SLE characterized by rash, arthralgias, hypocomplementemia, pericarditis, remote history of nephritis.     Assessment & Plan      1. SLE: Significantly improved, with minimal disease activity at this visit.   - Continue Cellcept  1000mg  BID   - Decrease prednisone  to 2mg  daily, and at 1 mg at the next visit.  - s/p Rituxan  1000mg  x 2 in August 2024, and in May/June 2025. Plan for repeat dose in December 2025 (orders placed), but will re-evaluate at next appointment.   - Continue Plaquenil  300 mg once daily. Up to date on eye exams  - Update CBCw/diff, Cr, AST, ALT, dsDNA, C3/C4, UPC, UA. I will personally review labs and contact patient with results.     2. Chronic lower back pain:   - Continue tizanidine  2 mg nightly.  - Continue oxycodone  5 mg once daily.  Pt reports inability to taper further. We discussed at length the risks of narcotics. She expresses clear understanding    Patient appears to be utilizing pain medications appropriately and reports that the medications do improve quality of life and ability to perform activities of daily living. Patient reports that they are stable on current pain medication regimen and wish to continue without any changes.    Effectiveness of Pain Medications:   Patient notes fair analgesia from medications. Patient reports that the medications help to improve the quality of their life.    Adverse Effects of Pain Medications:   Constipation: No  Sedation: No    H/o substance abuse: No  H/o suicidal ideation or intent: No     3. H/o osteopenia:  - Continue daily Vitamin D  and Calcium.     4. Tremor: developed tremor to b/l hands in last few months   - Saw neurology and d/c'ed propranolol . Primidone  started per neurology.     5. Elevated TSH:  Will discuss with PCP elevated TSH in the setting of recent weight gain.       Assessment & Plan        Julia Bates was seen today for lupus.    Diagnoses and all orders for this visit:    Other systemic lupus erythematosus with pericarditis   [M32.12]  -     Urinalysis with Microscopy with Culture Reflex  -     UPC  Urine Protein/Creatinine Ratio  -     CBC w/ Differential  -     Comprehensive Metabolic Panel  -     Anti-DSDNA  -     C3  -     C4  -     TSH  -     25 OH Vit D    Vitamin D  deficiency  -     76 OH Vit D        RTC in Return in about 3 months (around 05/11/2024).    We appreciate the opportunity to participate in the care of this patient. Total duration of the visit was >40 minutes , and > than 50% of the visit is spent in face-to-face counselling focussing on diagnosis and therapeutic options. The above plan was discussed at length with the patient who expresses clear understanding and is agreeable.      History of Present Illness:     Primary Care Provider: Geralene Levorn Geralds, FNP    Chief Complaint: Follow up SLE    HPI:  Julia Bates is a 38 y.o.  female      History of Present Illness  Julia Bates is a 38 year old female with lupus who presents with joint pain and stiffness.    She experiences pain in her arms, legs, and fingers, with notable stiffness in the fourth finger of her right hand, which sometimes locks. Being right-handed, this significantly impacts her daily activities. Her joint pain and stiffness have varied in severity over time. She has been on rituximab , with her last doses in May and June 2025, which has significantly alleviated her joint symptoms in the past. Her current medications include Cellcept  1000 mg twice a day, prednisone  2 mg daily, and Plaquenil  1.5 pills daily.    She reports shortness of breath but denies any new hair loss, hair thinning, or rashes. She has not noticed any foamy urine or blood in her urine. No discomfort in her abdomen, heartburn, or diarrhea.    She has a history of tremors and was previously on propranolol , which was discontinued due to weight gain. She recently started primidone  at bedtime but still experiences tremors, though they may be slightly improved.    She experiences chronic lower back pain and is currently taking tizanidine  2 mg at night and oxycodone  5 mg at night. She has tried baclofen  without relief and uses Voltaren  gel on her back.    She is not on thyroid medication but has had thyroid function tests in the past due to weight gain concerns. She is taking vitamin D  supplements and has had low levels in the past. She is not currently menstruating regularly.       Review of Systems: Negative for:   fever, chills, night sweats. Balance of 10 systems was reviewed and is negative except for that mentioned in the HPI.    Review of records: I have reviewed labs/images/clinic notes per the computerized medical record.       Allergies:  Sulfa (sulfonamide antibiotics)          Objective   Vitals:    02/09/24 1136   BP: 101/72   BP Site: L Arm   BP Position: Sitting   BP Cuff Size: Medium   Pulse: 100   Temp: 36.3 ??C (97.3 ??F)   TempSrc: Temporal   Weight: 73.9 kg (163 lb)     Constitutional:   Patient is well appearing, and does not appear to be in any acute distress   Eyes:   PERRLA, EOMI, and sclera aniteric. No scleral injection, no discharge, no allergic shiners.   ENT:   Moist, mucous membranes. Oropharhynx without any erythema or exudate.  TMs intact and non erythematosus B/L.   No sinus tenderness, nasal ulcerations, or oropharyngeal exudates or ulcerations.   Supple, no JVD. Trachea midline.      Lymphatic  No cervical lymphadenopathy   Cardiovascular:  Normal rate, regular rhythm.  S1 and S2 normal, without any murmur, rub, or gallop. No lower extremity edema.  Warm and well perfused. No edema.   Respiratory:  Clear to auscultation bilaterally, without                           Wheezes/crackles/rhonchi.  Good air movement. Normal work of breathing.   Skin:    No malar or discoid rash     Psychiatry:   Alert and oriented to person, place, and time. Appropriate affect. Mood and affect appropriate and congruent.  GI:   Normoactive bowel sounds, abdomen soft, non-tender and not distended, no hepatosplenomegaly or masses.   Musculo Skeletal:    No evidence of active synovitis in the small joints of the hands or feet. Knees and ankles cool B/L without effusions.  No bilateral cyanosis or clubbing.     Neurological:  Alert and oriented to person, place and time.  Cranial nerves III-XII intact.              Test Results  Office Visit on 02/09/2024   Component Date Value    Creat U 02/09/2024 129.8     Protein, Ur 02/09/2024 35.9     Protein/Creatinine Ratio* 02/09/2024 0.277     Sodium 02/09/2024 140     Potassium 02/09/2024 3.8     Chloride 02/09/2024 106     CO2 02/09/2024 25.4     Anion Gap 02/09/2024 9     BUN 02/09/2024 10     Creatinine 02/09/2024 0.81     BUN/Creatinine Ratio 02/09/2024 12     eGFR CKD-EPI (2021) Fema* 02/09/2024 >90     Glucose 02/09/2024 95     Calcium 02/09/2024 9.0     Albumin 02/09/2024 3.8     Total Protein 02/09/2024 6.6     Total Bilirubin 02/09/2024 0.3     AST 02/09/2024 30     ALT 02/09/2024 38     Alkaline Phosphatase 02/09/2024 127 (H)     C4 Complement 02/09/2024 17.9     TSH 02/09/2024 4.093     Color, UA 02/09/2024 Light Yellow     Clarity, UA 02/09/2024 Clear     Specific Gravity, UA 02/09/2024 1.019     pH, UA 02/09/2024 6.0     Leukocyte Esterase, UA 02/09/2024 Negative     Nitrite, UA 02/09/2024 Negative     Protein, UA 02/09/2024 Trace (A)     Glucose, UA 02/09/2024 Negative     Ketones, UA 02/09/2024 Negative     Urobilinogen, UA 02/09/2024 <2.0 mg/dL     Bilirubin, UA 92/98/7974 Negative     Blood, UA 02/09/2024 Negative     RBC, UA 02/09/2024 1     WBC, UA 02/09/2024 <1     Squam Epithel, UA 02/09/2024 1     Bacteria, UA 02/09/2024 None Seen     WBC 02/09/2024 5.3     RBC 02/09/2024 4.67     HGB 02/09/2024 12.6     HCT 02/09/2024 38.0     MCV 02/09/2024 81.5     MCH 02/09/2024 27.0     MCHC 02/09/2024 33.1     RDW 02/09/2024 13.7     MPV 02/09/2024 7.4     Platelet 02/09/2024 249     Neutrophils % 02/09/2024 68.5     Lymphocytes % 02/09/2024 18.6     Monocytes % 02/09/2024 11.9     Eosinophils % 02/09/2024 0.4     Basophils % 02/09/2024 0.6     Absolute Neutrophils 02/09/2024 3.6     Absolute Lymphocytes 02/09/2024 1.0 (L)     Absolute Monocytes 02/09/2024 0.6     Absolute Eosinophils 02/09/2024 0.0     Absolute Basophils 02/09/2024 0.0    Infusion on 01/13/2024   Component Date Value    WBC 01/13/2024 4.7     RBC 01/13/2024 4.94     HGB 01/13/2024 13.2     HCT 01/13/2024 40.2     MCV 01/13/2024 81.4     MCH 01/13/2024 26.8  MCHC 01/13/2024 32.9     RDW 01/13/2024 13.5     MPV 01/13/2024 7.4     Platelet 01/13/2024 239     Neutrophils % 01/13/2024 59.9     Lymphocytes % 01/13/2024 24.4     Monocytes % 01/13/2024 14.0     Eosinophils % 01/13/2024 1.2     Basophils % 01/13/2024 0.5     Absolute Neutrophils 01/13/2024 2.8     Absolute Lymphocytes 01/13/2024 1.1     Absolute Monocytes 01/13/2024 0.7     Absolute Eosinophils 01/13/2024 0.1     Absolute Basophils 01/13/2024 0.0

## 2024-02-11 DIAGNOSIS — I10 Essential (primary) hypertension: Principal | ICD-10-CM

## 2024-02-11 DIAGNOSIS — N939 Abnormal uterine and vaginal bleeding, unspecified: Principal | ICD-10-CM

## 2024-02-11 LAB — ANTI-DNA ANTIBODY, DOUBLE-STRANDED
DSDNA AB TITER: 1:10 {titer}
DSDNA ANTIBODY: POSITIVE — AB

## 2024-02-11 LAB — C3 COMPLEMENT: COMPLEMENT C3, S: 128 mg/dL

## 2024-02-11 MED ORDER — NORETHINDRONE (CONTRACEPTIVE) 0.35 MG TABLET
ORAL_TABLET | Freq: Every day | ORAL | 3 refills | 84.00000 days
Start: 2024-02-11 — End: 2025-02-10

## 2024-02-11 MED ORDER — LOSARTAN 50 MG TABLET
ORAL_TABLET | Freq: Every day | ORAL | 1 refills | 90.00000 days | Status: CP
Start: 2024-02-11 — End: 2025-02-10

## 2024-02-15 DIAGNOSIS — N939 Abnormal uterine and vaginal bleeding, unspecified: Principal | ICD-10-CM

## 2024-02-15 LAB — VITAMIN D 25 HYDROXY: VITAMIN D, TOTAL (25OH): 32.8 ng/mL (ref 20.0–80.0)

## 2024-02-15 MED ORDER — NORETHINDRONE (CONTRACEPTIVE) 0.35 MG TABLET
ORAL_TABLET | Freq: Every day | ORAL | 0 refills | 84.00000 days | Status: CP
Start: 2024-02-15 — End: 2025-02-14

## 2024-02-18 MED ORDER — DULOXETINE 30 MG CAPSULE,DELAYED RELEASE
ORAL_CAPSULE | 2 refills | 0.00000 days
Start: 2024-02-18 — End: ?

## 2024-02-18 NOTE — Unmapped (Signed)
 Refill request received from patient.      Medication Requested: Cymbalta    Last Office Visit: 10/16/2023   Next Office Visit: 03/18/2024  Last Prescriber: Dr. Telhan    Nurse refill requirements met? No  If not met, why: Needs provider approval    Sent to: Provider for signing  If sent to provider, which provider?: Dr. Telhan

## 2024-02-22 MED ORDER — DULOXETINE 30 MG CAPSULE,DELAYED RELEASE
ORAL_CAPSULE | Freq: Every evening | ORAL | 2 refills | 90.00000 days
Start: 2024-02-22 — End: ?

## 2024-02-22 NOTE — Unmapped (Signed)
 Duplicate

## 2024-02-24 DIAGNOSIS — G8929 Other chronic pain: Principal | ICD-10-CM

## 2024-02-24 MED ORDER — OXYCODONE 5 MG TABLET
ORAL_TABLET | Freq: Every day | ORAL | 0 refills | 30.00000 days | PRN
Start: 2024-02-24 — End: ?

## 2024-02-25 DIAGNOSIS — M3219 Other organ or system involvement in systemic lupus erythematosus: Principal | ICD-10-CM

## 2024-02-25 MED ORDER — OXYCODONE 5 MG TABLET
ORAL_TABLET | Freq: Every day | ORAL | 0 refills | 30.00000 days | Status: CP | PRN
Start: 2024-02-25 — End: ?

## 2024-02-25 MED ORDER — PREDNISONE 1 MG TABLET
ORAL_TABLET | Freq: Every day | ORAL | 0 refills | 90.00000 days | Status: CP
Start: 2024-02-25 — End: ?

## 2024-02-25 NOTE — Unmapped (Signed)
 Oxycodone  refill  Last ov: 02/09/2024  Next ov: 02/25/2024

## 2024-02-25 NOTE — Unmapped (Signed)
 Prednisone  refill  Last ov: 02/09/2024  Next ov: 05/27/2024

## 2024-02-28 NOTE — Unmapped (Signed)
 Assessment and Plan:     Assessment & Plan  Obesity  Unable to obtain Wegovy  due to insurance. Metformin  ineffective for weight loss. Discussed alternative medications including phentermine , which is safe given her low blood pressure. Discussed referral to weight loss specialist or nutritionist.  - Prescribe phentermine  30-day supply with refill, once daily before 10 AM.  - Reviewed all potential side effects with patient.   - Continue metformin  to enhance phentermine  effect.  - Refer to nutritionist for dietary management.  - Consider weight loss specialist referral if phentermine  ineffective.    Toe Injury  Persistent swelling and tenderness suggest possible fracture.  - Order x-ray of right toe to assess for fracture.  - Provide location card for x-ray at [facility].    Follow-up  Scheduled to assess phentermine  effectiveness and weight management progress.  - Schedule follow-up appointment in October 2025.             Obesity (BMI 30.0-34.9)  - Nutrition Services; Future  - phentermine  30 MG capsule; Take 1 capsule (30 mg total) by mouth every morning.    Injury of toe on right foot, initial encounter  - XR Toes, Right; Future    Pain and swelling of toe of right foot  - XR Toes, Right; Future           At onset of visit I advised patient I am using a new tool to record our discussion today. The recording would write down what we discuss and once reviewed and finalized would become a part of the chart note for today's visit. Patient amenable to recording.     I personally spent 30 minutes face-to-face and non-face-to-face in the care of this patient, which includes all pre, intra, and post visit time on the date of service.    Return in about 3 months (around 05/31/2024) for Next scheduled follow up.    HPI:      Julia Bates is here for   Chief Complaint   Patient presents with    Weight Check     Wegovy  not covered by insurance.            History of Present Illness  Julia Bates is a 38 year old female who presents for a follow-up on weight management.    She has been taking metformin  since December 02, 2023, without significant impact on her weight. No changes in appetite or intake have been noted. She was unable to obtain Wegovy  due to insurance coverage issues and is exploring other medication options for weight management, including oral medications such as phentermine , Contrave, and Qsymia.    She has a history of high blood pressure.    She is considering working with a nutritionist and is interested in nutritional counseling.    Additionally, she has an issue with her right toe, which she injured three months ago by bumping it on a plastic stool. Initially, it was bruised but not swollen. Currently, it remains swollen and tender, and she experiences some pain. She is able to move it slightly, but the swelling has not improved over the past three months.     ROS:      Comprehensive 10 point ROS negative unless otherwise stated in the HPI.      [facility] Components:     Medication adherence and barriers to the treatment plan have been addressed. Opportunities to optimize healthy behaviors have been discussed. Patient / caregiver voiced understanding.    Past Medical/Surgical  History:     Past Medical History[1]  Past Surgical History[2]    Family History:     Family History[3]    Social History:     Short Social History[4]    Allergies:     Sulfa (sulfonamide antibiotics)    Current Medications:     Current Medications[5]    Health Maintenance:     Health Maintenance   Topic Date Due    Pneumococcal Vaccine 0-49 (3 of 3 - PCV20 or PCV21) 08/06/2020    COVID-19 Vaccine (3 - Pfizer risk series) 10/27/2020    Influenza Vaccine (1) 04/11/2024    Serum Creatinine Monitoring  02/08/2025    Potassium Monitoring  02/08/2025    DTaP/Tdap/Td Vaccines (4 - Td or Tdap) 04/09/2025    HPV Cotest with Pap Smear (21-65)  01/30/2028    Pap Smear with Cotest HPV (21-65)  02/04/2028    Hepatitis C Screen  Completed Immunizations:     Immunization History   Administered Date(s) Administered    COVID-19 VAC,MRNA,TRIS(12Y UP)(PFIZER)(GRAY CAP) 09/08/2020    COVID-19 VACC,MRNA,(PFIZER)(PF) 09/29/2020    DTP 12/08/1990    HPV Quadrivalent (Gardasil) 03/24/2006, 06/01/2006, 10/02/2006    Influenza Vaccine Quad(IM)6 MO-Adult(PF) 06/05/2015, 05/06/2016, 07/07/2017, 07/06/2018, 04/29/2019, 05/17/2020    Influenza Virus Vaccine, unspecified formulation 05/06/2016, 07/07/2017    MMR 12/08/1990    Meningococcal C Conjugate 03/08/2004    Novel Influenza-h1n1-09, All Formulations 07/20/2008    PNEUMOCOCCAL POLYSACCHARIDE 23-VALENT 08/07/2015    PPD Test 05/20/2009    Pneumococcal Conjugate 13-Valent 04/10/2015    SHINGRIX-ZOSTER VACCINE (HZV),RECOMBINANT,ADJUVANTED(IM) 12/04/2021, 07/05/2022    TdaP 02/16/2012, 04/10/2015     I have reviewed and (if needed) updated the patient's problem list, medications, allergies, past medical and surgical history, social and family history.     Vital Signs:     Wt Readings from Last 3 Encounters:   02/29/24 74.4 kg (164 lb)   02/09/24 73.9 kg (163 lb)   01/29/24 73.5 kg (162 lb)     Temp Readings from Last 3 Encounters:   02/29/24 36.6 ??C (97.8 ??F) (Oral)   02/09/24 36.3 ??C (97.3 ??F) (Temporal)   01/13/24 36.5 ??C (97.7 ??F) (Temporal)     BP Readings from Last 3 Encounters:   02/29/24 87/56   02/09/24 101/72   01/29/24 105/79     Pulse Readings from Last 3 Encounters:   02/29/24 72   02/09/24 100   01/29/24 64     Estimated body mass index is 32.92 kg/m?? as calculated from the following:    Height as of 01/29/24: 149.9 cm (4' 11).    Weight as of 02/09/24: 73.9 kg (163 lb).  No height and weight on file for this encounter.      Objective:       General: Alert and oriented x3. Well-appearing. No acute distress.   HEENT:  Normocephalic.  Atraumatic. Conjunctiva and sclera normal. OP MMM without lesions.   Neck:  Supple. No thyroid enlargement. No adenopathy.   Heart:  Regular rate and rhythm. Normal S1, S2. No murmurs, rubs or gallops.   Lungs:  No respiratory distress.  Lungs clear to auscultation. No wheezes, rhonchi, or rales.   GI/GU:  Soft, +BS, nondistended, non-TTP. No palpable masses or organomegaly.   Extremities:  No ankle or foot swelling. Right 4th toe swollen and TTP. Peripheral pulses normal.   Skin:  Warm, dry. No rash or lesions present.   Neuro:  Non-focal. No obvious weakness.   Psych:  Affect normal, eye contact good, speech clear and coherent.           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    ECC pos CIN 1--12/19    Essential hypertension 09/23/2022    GERD (gastroesophageal reflux disease) Oct 23    HPV (human papilloma virus) infection     Low back pain     Lupus     Lupus     Osteoporosis     Pericarditis (HHS-HCC)     Rheumatoid arthritis    11/18/2015    SLE (systemic lupus erythematosus related syndrome)       [2]   Past Surgical History:  Procedure Laterality Date    CERVICAL BIOPSY  W/ LOOP ELECTRODE EXCISION      COLPOSCOPY      SKIN BIOPSY     [3]   Family History  Problem Relation Age of Onset    Glaucoma Father     Diabetes Father     Hypertension Father     Heart attack Father     Cataracts Father     Stroke Father     Neuropathy Mother     Osteoporosis Mother     No Known Problems Sister     Diabetes Sister     Diabetes Sister     Diabetes Sister     Diabetes Sister     Melanoma Neg Hx     Basal cell carcinoma Neg Hx     Squamous cell carcinoma Neg Hx     Macular degeneration Neg Hx     Blindness Neg Hx    [4]   Social History  Tobacco Use    Smoking status: Never     Passive exposure: Never    Smokeless tobacco: Never    Tobacco comments:     None   Vaping Use    Vaping status: Never Used   Substance Use Topics    Alcohol use: Never    Drug use: Never   [5] Current Outpatient Medications   Medication Sig Dispense Refill    calcium carbonate-vitamin D3 600 mg(1,500mg ) -200 unit per tablet Take 1 tablet by mouth Two (2) times a day.      diclofenac  sodium (VOLTAREN ) 1 % gel Apply 2 g topically four (4) times a day. 100 g 5    DULoxetine  (CYMBALTA ) 30 MG capsule TAKE 1 CAPSULE BY MOUTH NIGHTLY 90 capsule 2    ergocalciferol , vitamin D2, (VITAMIN D2 ORAL) Take 1,000 Units by mouth daily with evening meal.      hydroxychloroquine  (PLAQUENIL ) 200 mg tablet TAKE 1.5 TABLETS BY MOUTH DAILY. IF UNABLE TO TOLERATE THE SMELL OF SPLITTING TAB -- CAN TAKE 200 MG ON MWF, 400 MG OTHER DAYS OF THE WEEK 540 tablet 3    hydrOXYzine  (ATARAX ) 25 MG tablet Take 1 tablet (25 mg total) by mouth every eight (8) hours as needed. 270 tablet 0    losartan  (COZAAR ) 50 MG tablet Take 1 tablet (50 mg total) by mouth daily. 90 tablet 1    melatonin 5  mg tablet Take 1 tablet (5 mg total) by mouth nightly.      metFORMIN  (GLUCOPHAGE ) 500 MG tablet Take 1 tablet (500 mg total) by mouth in the morning. 90 tablet 1    mycophenolate  (CELLCEPT ) 500 mg tablet Take 2 tablets (1,000 mg total) by mouth two (2) times a day. 360 tablet 3    norethindrone  (MICRONOR ) 0.35 mg tablet Take 1 tablet by mouth daily. 84 tablet 0    omeprazole  (PRILOSEC) 20 MG capsule Take 1 capsule (20 mg total) by mouth daily. 90 capsule 2    oxyCODONE  (ROXICODONE ) 5 MG immediate release tablet Take 1 tablet (5 mg total) by mouth daily as needed for pain. 30 tablet 0    predniSONE  (DELTASONE ) 1 MG tablet Take 2 tablets (2 mg total) by mouth daily. 180 tablet 0    primidone  (MYSOLINE ) 50 MG tablet Take 0.5 tablets (25 mg total) by mouth nightly. 0.5 tab nightly x1 wk, then 1 tab nightly 30 tablet 2    semaglutide , weight loss, (WEGOVY ) 0.25 mg/0.5 mL injection pen Inject 0.25 mg under the skin every seven (7) days. (Patient not taking: Reported on 02/09/2024) 2 mL 1    tizanidine  (ZANAFLEX ) 2 MG tablet Take 1 tablet (2 mg total) by mouth every eight (8) hours as needed. 270 tablet 0    traZODone  (DESYREL ) 50 MG tablet Take 2 tablets (100 mg total) by mouth nightly. 180 tablet 1     No current facility-administered medications for this visit.

## 2024-02-29 ENCOUNTER — Inpatient Hospital Stay: Admit: 2024-02-29 | Discharge: 2024-02-29 | Payer: Medicaid (Managed Care)

## 2024-02-29 DIAGNOSIS — E66811 Obesity (BMI 30.0-34.9): Principal | ICD-10-CM

## 2024-02-29 DIAGNOSIS — S99921A Unspecified injury of right foot, initial encounter: Principal | ICD-10-CM

## 2024-02-29 DIAGNOSIS — N939 Abnormal uterine and vaginal bleeding, unspecified: Principal | ICD-10-CM

## 2024-02-29 DIAGNOSIS — G8929 Other chronic pain: Principal | ICD-10-CM

## 2024-02-29 DIAGNOSIS — M7989 Other specified soft tissue disorders: Principal | ICD-10-CM

## 2024-02-29 DIAGNOSIS — M79674 Pain in right toe(s): Principal | ICD-10-CM

## 2024-02-29 MED ORDER — OXYCODONE 5 MG TABLET
ORAL_TABLET | Freq: Every day | ORAL | 0 refills | 30.00000 days | PRN
Start: 2024-02-29 — End: ?

## 2024-02-29 MED ORDER — DULOXETINE 30 MG CAPSULE,DELAYED RELEASE
ORAL_CAPSULE | ORAL | 2 refills | 0.00000 days | Status: CP
Start: 2024-02-29 — End: ?

## 2024-02-29 MED ORDER — NORETHINDRONE (CONTRACEPTIVE) 0.35 MG TABLET
ORAL_TABLET | Freq: Every day | ORAL | 1 refills | 0.00000 days
Start: 2024-02-29 — End: ?

## 2024-02-29 MED ORDER — PHENTERMINE 30 MG CAPSULE
ORAL_CAPSULE | Freq: Every morning | ORAL | 1 refills | 30.00000 days | Status: CP
Start: 2024-02-29 — End: ?

## 2024-03-01 MED ORDER — NORETHINDRONE (CONTRACEPTIVE) 0.35 MG TABLET
ORAL_TABLET | Freq: Every day | ORAL | 1 refills | 84.00000 days | Status: CP
Start: 2024-03-01 — End: ?

## 2024-03-01 NOTE — Unmapped (Signed)
 [facility] Refill Coordination Note    Specialty Medication(s) to be Shipped:   Inflammatory Disorders: mycophenolate     Other medication(s) to be shipped: No additional medications requested for fill at this time     Julia Bates, DOB: October 28, 1985  Phone: [phone] (home)       All above HIPAA information was verified with patient.     Was a Nurse, learning disability used for this call? No    Completed refill call assessment today to schedule patient's medication shipment from the [facility]  [phone]).  All relevant notes have been reviewed.     Specialty medication(s) and dose(s) confirmed: Regimen is correct and unchanged.   Changes to medications: Julia Bates reports no changes at this time.  Changes to insurance: No  New side effects reported not previously addressed with a pharmacist or physician: None reported  Questions for the pharmacist: No    Confirmed patient received a Conservation officer, historic buildings and a Surveyor, mining with first shipment. The patient will receive a drug information handout for each medication shipped and additional FDA Medication Guides as required.       DISEASE/MEDICATION-SPECIFIC INFORMATION        N/A    SPECIALTY MEDICATION ADHERENCE     Medication Adherence    Patient reported X missed doses in the last month: 0  Specialty Medication: mycophenolate  500 mg tablet (CELLCEPT )  Patient is on additional specialty medications: No              Were doses missed due to medication being on hold? No      mycophenolate  500 mg tablet (CELLCEPT ): 14 days of medicine on hand       REFERRAL TO PHARMACIST     Referral to the pharmacist: Not needed      [facility]     Shipping address confirmed in Epic.     Cost and Payment: Patient has a copay of $4.00. They are aware and have authorized the pharmacy to charge the credit card on file.    Delivery Scheduled: Yes, Expected medication delivery date: 8.1.25.     Medication will be delivered via Next Day Courier to the prescription address in Epic WAM.    Julia Bates   [facility]  Specialty Technician

## 2024-03-02 DIAGNOSIS — G8929 Other chronic pain: Principal | ICD-10-CM

## 2024-03-02 NOTE — Unmapped (Signed)
 PA request received for medication oxycodone  . Dot phrase with MAP requested information routed via staff message to MAP pool. See referral tab for updates.

## 2024-03-03 NOTE — Unmapped (Signed)
 Addended by: Tarhonda Hollenberg M on: 03/03/2024 04:02 PM     Modules accepted: Orders

## 2024-03-03 NOTE — Unmapped (Addendum)
 Correct dx to RT toe.  Erminio at [facility] notified. And faxed new order.

## 2024-03-10 MED FILL — MYCOPHENOLATE MOFETIL 500 MG TABLET: ORAL | 30 days supply | Qty: 120 | Fill #4

## 2024-03-17 MED ORDER — DULOXETINE 30 MG CAPSULE,DELAYED RELEASE
ORAL_CAPSULE | 2 refills | 0.00000 days
Start: 2024-03-17 — End: ?

## 2024-03-17 NOTE — Unmapped (Signed)
 PM&R - [facility]     Referring Provider: Bartolo, Kathryne B, MD  Primary Care Provider: Geralene Levorn Geralds, FNP    Julia Bates is a 38 y.o. old female  has a past medical history of Abnormal Pap smear of cervix (2010), Allergic, Cataract, Deep vein thrombosis (May 24 2022), Dysplasia of cervix, low grade (CIN 1) (08/10/2018), Essential hypertension (09/23/2022), GERD (gastroesophageal reflux disease) (Oct 23), HPV (human papilloma virus) infection, Low back pain, Lupus, Lupus, Osteoporosis, Pericarditis (HHS-HCC), Rheumatoid arthritis (11/18/2015), and SLE (systemic lupus erythematosus related syndrome). who is being seen today by Ladena Blanch, DO, at the consultation request of Bartolo, Kathryne B, MD for follow up visit.     Pertinent medical records reviewed for this visit.    Chief Complaint:   Chief Complaint   Patient presents with    Back Pain     Interval History 03/18/2024:  Patient presents today for follow up with persistent low back pain without radicular symptoms with pain worse on the left than the right. She has been taking oxycodone , tizanidine , and Cymbalta .     Interval History 10/16/2023:  Patient presents today with persistent low back pain. She has completed PT. She has been taking oxycodone  and tizanidine . She notes she took meloxicam  for 14 days with some improvement in her pain. She has been taking cymbalta  60mg  daily.     History of Present Illness:  Pain Description:  The pain started many years ago without inciting event  Since the pain first started, it is worse.  The most significant location of pain is the low back without radicular pain   The 1-2 words that best describe the pain: sharp and aching  The pain improves with oxycodone , heating.  The pain is made worse with standing for prolonged time  The average daily pain score is 7/10. The pain can be as high as 9/10 at its worst.  The pain interferes with: All activities.    Red flag symptoms:  Weakness: Yes - subjective, due to lupus  Bladder incontinence: No   Bowel incontinence: No   Saddle anesthesia: No     Therapies:  Previously attempted interventions for this pain concern:   -None    Has physical therapy been initiated or completed for this pain concern? Yes  Where was physical therapy completed? At [facility] based clinic  Has a home exercise program (directed by a physical therapist or medical provider) been completed for this pain concern? Yes    Medications:  Currently utilized over the counter medications/therapy for pain: Acetaminophen /Tylenol     Currently utilized prescription medications/therapies for pain: Muscle relaxers   -Tizanidine  2mg  nightly PRN    Currently utilized controlled medications: Pure opioid agonist (Oxycodone /Hydrocodone/Dilaudid/Nucynta/Fentanyl/Methadone)  - Oxycodone  - Rx from rheumatology     Is the patient on a blood thinner (including aspirin, Goody/BC/Bayer powder)? No   - Name of anticoagulant/blood thinner: Not applicable   - Used to take eliquis  for DVT - completed in April 2024  ____________________________________________________________________  Past Medical History:  Past Medical History:   Diagnosis Date    Abnormal Pap smear of cervix 2010    Allergic     Sulfa    Cataract     Deep vein thrombosis    May 24 2022    Dysplasia of cervix, 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    ECC pos CIN 1--12/19    Essential hypertension 09/23/2022    GERD (gastroesophageal reflux disease) Oct 23    HPV (  human papilloma virus) infection     Low back pain     Lupus     Lupus     Osteoporosis     Pericarditis (HHS-HCC)     Rheumatoid arthritis    11/18/2015    SLE (systemic lupus erythematosus related syndrome)           Past Surgical History:  Past Surgical History:   Procedure Laterality Date    CERVICAL BIOPSY  W/ LOOP ELECTRODE EXCISION      COLPOSCOPY      SKIN BIOPSY         Family History:  Family History   Problem Relation Age of Onset    Glaucoma Father     Diabetes Father     Hypertension Father Heart attack Father     Cataracts Father     Stroke Father     Neuropathy Mother 9 - 28    Osteoporosis Mother 64 - 50    No Known Problems Sister     Diabetes Sister     Diabetes Sister     Diabetes Sister     Diabetes Sister     Melanoma Neg Hx     Basal cell carcinoma Neg Hx     Squamous cell carcinoma Neg Hx     Macular degeneration Neg Hx     Blindness Neg Hx        Social History:  Social History     Tobacco Use    Smoking status: Never     Passive exposure: Never    Smokeless tobacco: Never    Tobacco comments:     None   Substance Use Topics    Alcohol use: Never       Allergies:  Sulfa (sulfonamide antibiotics)    Current Medications:  Current Outpatient Medications   Medication Sig Dispense Refill    calcium carbonate-vitamin D3 600 mg(1,500mg ) -200 unit per tablet Take 1 tablet by mouth Two (2) times a day.      diclofenac  sodium (VOLTAREN ) 1 % gel Apply 2 g topically four (4) times a day. 100 g 5    DULoxetine  (CYMBALTA ) 30 MG capsule TAKE 1 CAPSULE (30 MG TOTAL) BY MOUTH DAILY AND 2 CAPSULES (60 MG TOTAL) NIGHTLY. 90 capsule 2    ergocalciferol , vitamin D2, (VITAMIN D2 ORAL) Take 1,000 Units by mouth daily with evening meal.      hydroxychloroquine  (PLAQUENIL ) 200 mg tablet TAKE 1.5 TABLETS BY MOUTH DAILY. IF UNABLE TO TOLERATE THE SMELL OF SPLITTING TAB -- CAN TAKE 200 MG ON MWF, 400 MG OTHER DAYS OF THE WEEK 540 tablet 3    hydrOXYzine  (ATARAX ) 25 MG tablet Take 1 tablet (25 mg total) by mouth every eight (8) hours as needed. 270 tablet 0    losartan  (COZAAR ) 50 MG tablet Take 1 tablet (50 mg total) by mouth daily. 90 tablet 1    melatonin 5 mg tablet Take 1 tablet (5 mg total) by mouth nightly.      metFORMIN  (GLUCOPHAGE ) 500 MG tablet Take 1 tablet (500 mg total) by mouth in the morning. 90 tablet 1    mycophenolate  (CELLCEPT ) 500 mg tablet Take 2 tablets (1,000 mg total) by mouth two (2) times a day. 360 tablet 3    norethindrone  (MICRONOR ) 0.35 mg tablet TAKE 1 TABLET BY MOUTH DAILY 84 tablet 1    omeprazole  (PRILOSEC) 20 MG capsule Take 1 capsule (20 mg total) by mouth daily. 90 capsule 2  oxyCODONE  (ROXICODONE ) 5 MG immediate release tablet Take 1 tablet (5 mg total) by mouth daily as needed for pain. 30 tablet 0    phentermine  30 MG capsule Take 1 capsule (30 mg total) by mouth every morning. 30 capsule 1    predniSONE  (DELTASONE ) 1 MG tablet Take 2 tablets (2 mg total) by mouth daily. 180 tablet 0    primidone  (MYSOLINE ) 50 MG tablet Take 0.5 tablets (25 mg total) by mouth nightly. 0.5 tab nightly x1 wk, then 1 tab nightly 30 tablet 2    tizanidine  (ZANAFLEX ) 2 MG tablet Take 1 tablet (2 mg total) by mouth every eight (8) hours as needed. 270 tablet 0    traZODone  (DESYREL ) 50 MG tablet Take 2 tablets (100 mg total) by mouth nightly. 180 tablet 1     No current facility-administered medications for this visit.       Review of Systems:  Pertinent positive/negative findings related to today's visit are noted in the HPI and Assessment/Plan.  ____________________________________________________________________  Vitals:  Vitals:    03/18/24 1149   Temp: 36.4 ??C (97.5 ??F)   Weight: 72.5 kg (159 lb 12.8 oz)   Height: 149.9 cm (4' 11)         Physical Exam:    Constitutional: Well developed, Well nourished, No acute distress and Interactive.  General: Patient is alert and orientated.  Psychological: The patient's mood is normal, and appropriate for the circumstances.    Pain (Neuro/Musculoskeletal) Exam:  Gait: Normal. Uses assist device - none  Reflexes: No clonus    Reflex LEFT RIGHT   Patellar 2+ 2+   Achilles 2+ 2+     Motor   Muscle  LEFT RIGHT    EHL (L5) 5/5 5/5   TA (L4) 5/5 5/5   GS (S1) 5/5 5/5   Quad (L3) 5/5 5/5   HS (L2) 5/5 5/5   IS 5/5 5/5     Appropriate strength without any gross motor deficit appreciated.    Sensory:  Intact to light touch of all four extremities    Special Exams:  Lumbar:  Positive lumbar paraspinal tenderness bilaterally  Positive pain with lumbar facet loading bilaterally L>R  Negative SLR bilaterally  ____________________________________________________________________  Controlled Substance Monitoring:  NCPDMP: Reviewed Today    Prescriptions  Total: 27 - Private Pay: 7  Showing 1-15 of 27 Items View 15 Items   of 2   Filled  Written  Sold  ID  Drug  QTY  Days  Prescriber  RX #  Dispenser  Refill  Daily Dose*  Pymt Type  PMP    03/02/2024 02/25/2024  1 Oxycodone  Hcl (Ir) 5 Mg Tablet 30.00 30 Gr E [id number] Nor (1409) 0/0 7.50 MME Medicaid [place]   02/29/2024 02/29/2024  1 Phentermine  30 Mg Capsule 30.00 30 Ke Mac [id number] Nor (1409) 0/1  Medicaid [place]   01/27/2024 01/27/2024  1 Oxycodone  Hcl (Ir) 5 Mg Tablet 30.00 30 Gr E [id number] Nor (1409) 0/0 7.50 MME Medicaid [place]   12/27/2023 12/27/2023  1 Oxycodone  Hcl (Ir) 5 Mg Tablet 30.00 30 Sa She [id number] Nor (1409) 0/0 7.50 MME Medicaid [place]   11/27/2023 11/27/2023  1 Oxycodone  Hcl (Ir) 5 Mg Tablet 30.00 30 Au Wil [id number] Nor (1409) 0/0 7.50 MME Medicaid [place]   10/26/2023 10/23/2023  1 Oxycodone  Hcl (Ir) 5 Mg Tablet 30.00 30 Gr E [id number] Nor (1409) 0/0 7.50 MME Medicaid [place]   09/28/2023 09/28/2023  1 Oxycodone  Hcl (Ir) 5 Mg Tablet  30.00 30 Gr E [id number] Nor (1409) 0/0 7.50 MME Medicaid [place]   08/28/2023 08/28/2023  1 Oxycodone  Hcl (Ir) 5 Mg Tablet 30.00 30 Gr E [id number] Nor (1409) 0/0 7.50 MME Medicaid [place]   07/29/2023 07/29/2023  1 Oxycodone  Hcl (Ir) 5 Mg Tablet 30.00 30 Gr E [id number] Nor (1409) 0/0 7.50 MME Medicaid [place]   06/30/2023 06/30/2023  1 Oxycodone  Hcl (Ir) 5 Mg Tablet 30.00 30 Gr E [id number] Nor (1409) 0/0 7.50 MME Medicaid [place]   05/30/2023 05/27/2023  1 Oxycodone  Hcl (Ir) 5 Mg Tablet 30.00 30 Gr E [id number] Nor (1409) 0/0 7.50 MME Medicaid [place]   04/30/2023 04/30/2023  1 Oxycodone  Hcl (Ir) 5 Mg Tablet 30.00 30 Gr E [id number] Nor (1409) 0/0 7.50 MME Medicaid [place]   03/31/2023 03/31/2023  1 Oxycodone  Hcl (Ir) 5 Mg Tablet 30.00 30 Sa She [id number] Nor (1409) 0/0 7.50 MME Medicaid [place]   02/27/2023 02/27/2023  1 Oxycodone  Hcl (Ir) 5 Mg Tablet 30.00 30 Gr E [id number] Nor (1409) 0/0 7.50 MME Medicaid [place]   01/30/2023 01/29/2023  1 Oxycodone  Hcl (Ir) 5 Mg Tablet 30.00 30 Gr E           ____________________________________________________________________  Imaging and Diagnostic Studies:  All applicable diagnostic studies related to this consultation have been reviewed.  Relevant diagnostic reports and/or my personal review of imaging or other diagnostic studies listed below:    MRI Lumbar Spine Wo Contrast  Status: Final result     PACS Images     Show images for MRI Lumbar Spine Wo Contrast MRI Screening Form    Screening Form      Impression   -- Disc bulge and facet arthrosis at L5-S1 resulting in moderate to severe left and moderate right neural foraminal narrowing. Otherwise patent spinal canal and neuroforamina.              Narrative   EXAM: Magnetic resonance imaging, spinal canal and contents, lumbar, without contrast material.   DATE: 11/24/2022 1:10 PM   ACCESSION: [id number]   DICTATED: 11/24/2022 1:23 PM   INTERPRETATION LOCATION: [facility]      CLINICAL INDICATION: 38 years old Female with chronic LBP  - M54.50 - Chronic bilateral low back pain without sciatica - G89.29 - Chronic bilateral low back pain without sciatica.      COMPARISON: Lumbar spine CT 09/30/2022.      TECHNIQUE: Multiplanar MRI was performed through the lumbar spine without intravenous contrast.      FINDINGS:   Diffuse marrow heterogeneity with suppression on fat saturated sequence likely representing degenerative fatty changes. The visualized cord is unremarkable and the conus medullaris ends at a normal level.      The vertebral bodies are normally aligned. Disc spaces are preserved.      L5-S1: Disc bulge and bilateral facet arthrosis contributes to moderate to severe left and moderate right neural foraminal narrowing.      Otherwise, no significant spinal canal or neural foraminal narrowing.      The paraspinal tissues are within normal limits.      For the purposes of this dictation, the lowest well formed intervertebral disc space is assumed to be the L5-S1 level, and there are presumed to be five lumbar-type vertebral bodies.        ____________________________________________________________________  Relevant Labs Reviewed: Below labs reviewed    Lab Results   Component Value Date    WBC 5.3 02/09/2024    HGB 12.6 02/09/2024  HCT 38.0 02/09/2024    MCV 81.5 02/09/2024    PLT 249 02/09/2024       No results found for: BMP, CMP     Lab Results   Component Value Date    CREATININE 0.81 02/09/2024       No results found for: HGBA1C  ____________________________________________________________________  Diagnosis:   Diagnosis ICD-10-CM Associated Orders   1. Lumbar spondylosis  M47.816 Facet Inj Lumbar/Sacral Single Level     Facet Inj Lumbar/Sacral 2nd Level      2. Facet arthropathy, lumbar  M47.816 Facet Inj Lumbar/Sacral Single Level     Facet Inj Lumbar/Sacral 2nd Level      3. Lumbar facet joint syndrome  M47.816 Facet Inj Lumbar/Sacral Single Level     Facet Inj Lumbar/Sacral 2nd Level          Assessment and Plan:  Julia Bates is a 37 y.o. old female referred from Bartolo, Kathryne B, MD presenting for follow up visit.     Patient presents today for follow up of axial low back pain. She denies any weakness, sensory deficits, or red flag symptoms. Her symptoms are most consistent with degenerative disc disease and lumbar facet mediated pain. We discussed her MRI lumbar spine imaging which demonstrates L5-S1 disc bulge and facet hypertrophy contributing to severe L and moderate R NFS. Patient has been participating in PT. We discussed several medications. She has tried gabapentin  and lyrica  previously with side effects. She continues to take cymbalta  60mg  daily. She did take meloxicam  for a short period of time with good relief. Per chart review patient with rectal bleeding likely due to hemorrhoids. Given patient with persistent axial low back pain despite PT and medications, recommend proceeding with bilateral L4-L5 and L5-S1 MBB/RFA pathway.    Interventional treatments:  -Bilateral L4-L5 and L5-S1 MBB  -Risk and benefits of procedure were discussed. Risk include but not limited to vasovagal (passing out) reaction, infection, bleeding, hematoma, nerve injury, paralysis, spinal fluid leak, headache, spinal cord injury, injection site soreness, increase in pain, new pain, no relief of pain, pneumothorax, seizure, allergic reaction to medication and/or equipment use, steroid reaction, hemodynamic compromise, cardiac/respiratory arrest, and/or death.  Alternatives including conservative care, additional imaging, medication trials, physical therapy, alternative therapy/modalities and surgical referral were discussed and all questions were answered.  The patient understands the transient weakness may result from this procedure and the patient should not drive the day of the injection.  Patient says that any appropriate blood thinners need to be discontinued for appropriate amount of time prior to injection but authorization/clearance from the patient's prescribing physician must be obtained prior to discontinuing any medications.  Medical image/model was used to illustrate how injection will be performed and to explain the disease state.  Patient fully understands and comprehends the risks and benefits of the procedure.  Patient expressed understanding and would like to proceed.  All questions were answered.     Medication recommendations:  -Continue cymbalta  60mg  daily     Physical therapy:  -The patient has previously completed a physical therapy regimen for the above concern and engages with therapist directed HEP for above concern.    Imaging/diagnostic studies recommended:  -None    Consults/referrals placed:  -None    Follow up recommendation:  -Follow up for injection above     Treatment plan fully discussed and agreed upon with the patient. All questions were answered to her satisfaction today.    Medical decision making for this patient was  moderately complex given the patient's preexisting comorbidities, chronicity of the patient's current pathology, review of relevant records from other healthcare provides, independent interpretation of imaging and/or labs if appropriate, discussion regarding risks and benefits of proceeding forward with or holding off on scheduling a minor procedure if appropriate and designated above, the severity/progression of the pathology discussed, specific medication if reviewed, discussion regarding financial implications of treatment, and/or the risk of complication and/or morbidity that may exist with or without treatment.    Ladena Blanch, DO   Interventional Pain/Spine Medicine  Assistant Professor - Physical Medicine & Rehabilitation   [employer]  - [facility] - [facility] of [employer]

## 2024-03-18 ENCOUNTER — Ambulatory Visit
Admit: 2024-03-18 | Discharge: 2024-03-19 | Payer: Medicaid (Managed Care) | Attending: Student in an Organized Health Care Education/Training Program | Primary: Student in an Organized Health Care Education/Training Program

## 2024-03-18 DIAGNOSIS — M47816 Spondylosis without myelopathy or radiculopathy, lumbar region: Principal | ICD-10-CM

## 2024-03-18 MED ORDER — LORAZEPAM 0.5 MG TABLET
ORAL_TABLET | Freq: Once | ORAL | 0 refills | 0.00000 days | Status: CP | PRN
Start: 2024-03-18 — End: ?

## 2024-03-18 MED ORDER — NALOXONE 4 MG/ACTUATION NASAL SPRAY
Freq: Once | NASAL | 1 refills | 0.00000 days | Status: CP | PRN
Start: 2024-03-18 — End: ?

## 2024-03-18 NOTE — Unmapped (Signed)
 Addended by: TOBIE HIRSCHFELD B on: 03/18/2024 12:14 PM     Modules accepted: Orders

## 2024-03-18 NOTE — Unmapped (Signed)
 PATIENT VERBALIZED UNDERSTANDING. REQUESTED A SEDATIVE. DR PATEL NOTIFIED. ALL QUESTIONS ANSWERED.

## 2024-03-18 NOTE — Unmapped (Signed)
 Dear [name],    Thank you for coming to [facility] today.    -Please follow up for ordered procedure: lumbar medial branch blocks  -Please No follow-ups on file.    You may contact me through MyChart or call our office with any questions.     If you develop any red flag signs such as new or progressive motor weakness, sensory deficits, saddle anesthesia (groin numbness), bowel/bladder dysfunction, gait/coordination disturbance, weight loss, or night pain, you should call our office and/or seek prompt evaluation at the nearest ER.    Ladena Tobie, DO  Interventional Pain Medicine

## 2024-03-22 NOTE — Unmapped (Signed)
 [facility] at [facility]  [address] [place], [place] [address]   Phone: [phone]    Date of Service: 03/28/2024    Patient Clinic Note    PCP: Referring Provider:   Geralene Levorn Geralds, FNP  [address] [place] [address]  Phone: [phone]  Fax: [phone] Jama Old, MD  [address] [place],  [place] [address]  Phone: [phone]  Fax: [phone]       Assessment and Plan:     Julia Bates is a 38 y.o. female with PMHX of SLE on immunosuppresion, history of pericarditis who presents for cardiac follow-up.     dyspnea on exertion  - Stable since our last visit.  Coronary CTA without CAD.  Echo reveals normal biventricular function.  I think very unlikely to be restriction/constriction given reassuring echo findings and given only 1 episode of pericarditis in the past.  -Would not recommend further cardiac workup.    Pericarditis  - Resolved.  Occurred 02/2022 during lupus flare.  - Resolved with prednisone  and colchicine .      Systemic lupus erythematous  - Follows closely with with rheumatology for this.     Hypertension  - Well controlled on losartan .  Continue.    Hyperlipidemia  - Mild, and improved recently with dietary changes.      Lab Results   Component Value Date    CHOL 146 09/17/2023    CHOL 206 (H) 03/18/2022     Lab Results   Component Value Date    HDL 46 09/17/2023    HDL 60 03/18/2022     Lab Results   Component Value Date    LDL 81 09/17/2023    LDL 111 (H) 03/18/2022     Lab Results   Component Value Date    VLDL 18.6 09/17/2023    VLDL 35 (H) 03/18/2022     Lab Results   Component Value Date    CHOLHDLRATIO 3.2 09/17/2023    CHOLHDLRATIO 3.4 03/18/2022     Lab Results   Component Value Date    TRIG 93 09/17/2023    TRIG 175 (H) 03/18/2022       The ASCVD Risk score (Arnett DK, et al., 2019) failed to calculate for the following reasons:    The 2019 ASCVD risk score is only valid for ages 23 to 65    Note: For patients with SBP <90 or >200, Total Cholesterol <130 or >320, HDL <20 or >100 which are outside of the allowable range, the calculator will use these upper or lower values to calculate the patient???s risk score.      Lab Results   Component Value Date    A1C 5.3 08/13/2010         Return if symptoms worsen or fail to improve.          Subjective:     Chief Complaint:  Chief Complaint   Patient presents with    Follow-up         Referring Provider: Jama Old, MD    History of Present Illness:     Julia Bates is a 38 y.o. female, with a history of lupus and acute pericarditis 02/2022 who is here for follow-up of pericarditis.     Overall doing well since last visit.  Denies chest pain palpitations lightheadedness dizziness or syncope.  Overall feeling well.  Continues with dyspnea on exertion and her exertion is limited by joint pain as  well as dyspnea.      -----Presenting HPI-----------  Patient recently admitted to [facility] for acute chest pain, diagnosed with pericarditis and placed on increased immunotherapy with rheumatology input.  She is now on 0.5 mg of prednisone  daily, done with her colchicine .  She notes 1 out of 10 chronic chest discomfort which is improved since presentation to the hospital but about the same as when she was discharged.  She also notes some shortness of breath with exertion most notably when she is going upstairs as she works for [employer] ([employer]) occasionally.  She states this is new and was not an issue prior to this pericarditis episode.    Denies syncope, presyncope, significant chest discomfort.    Cardiovascular History & Procedures:  Cardiovascular Problems:  Pericarditis  Lupus    Cath / PCI:  None    CV Surgery:  None    EP Procedures and Devices:  None    Non-Invasive Evaluation(s): independently reviewed the most recent study.   Echocardiogram (03/2022) -normal LVEF, normal RV function and size, no pericardial effusion  Cardiac CT 10/21/2022-no coronary artery disease    Medical History:  Past Medical History:   Diagnosis Date    Abnormal Pap smear of cervix 2010    Allergic     Sulfa    Cataract     Deep vein thrombosis     May 24 2022    Dysplasia of cervix, 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    ECC pos CIN 1--12/19    Essential hypertension 09/23/2022    GERD (gastroesophageal reflux disease) Oct 23    HPV (human papilloma virus) infection     Low back pain     Lupus     Lupus     Osteoporosis     Pericarditis (HHS-HCC)     Rheumatoid arthritis     11/18/2015    SLE (systemic lupus erythematosus related syndrome)            Surgical History:  Past Surgical History:   Procedure Laterality Date    CERVICAL BIOPSY  W/ LOOP ELECTRODE EXCISION      COLPOSCOPY      SKIN BIOPSY         Social History:   reports that she has never smoked. She has never been exposed to tobacco smoke. She has never used smokeless tobacco. She reports that she does not drink alcohol and does not use drugs.    Family History:  family history includes Cataracts in her father; Diabetes in her father, sister, sister, sister, and sister; Glaucoma in her father; Heart attack in her father; Hypertension in her father; Neuropathy (age of onset: 48 - 89) in her mother; No Known Problems in her sister; Osteoporosis (age of onset: 35 - 91) in her mother; Stroke in her father.    Review of Systems:   Except as noted in the HPI, the remainder of 10 systems reviewed is negative.     Allergies:  Allergies   Allergen Reactions    Sulfa (Sulfonamide Antibiotics) Nausea And Vomiting and Other (See Comments)     Other reaction(s): FEVER       Medications:   Prior to Admission medications   Medication Dose, Route, Frequency   calcium carbonate-vitamin D3 600 mg(1,500mg ) -200 unit per tablet 1 tablet, 2 times a day   diclofenac  sodium (VOLTAREN ) 1 % gel 2 g, Topical, 4 times a day   DULoxetine  (CYMBALTA ) 30 MG capsule TAKE 1 CAPSULE (30  MG TOTAL) BY MOUTH DAILY AND 2 CAPSULES (60 MG TOTAL) NIGHTLY.   ergocalciferol , vitamin D2, (VITAMIN D2 ORAL) 1,000 Units, Daily   hydroxychloroquine  (PLAQUENIL ) 200 mg tablet TAKE 1.5 TABLETS BY MOUTH DAILY. IF UNABLE TO TOLERATE THE SMELL OF SPLITTING TAB -- CAN TAKE 200 MG ON MWF, 400 MG OTHER DAYS OF THE WEEK   hydrOXYzine  (ATARAX ) 25 MG tablet 25 mg, Oral, Every 8 hours PRN   LORazepam  (ATIVAN ) 0.5 MG tablet 0.5 mg, Oral, Once as needed, Take 1 tablet (0.5 mg total) by mouth once as needed for procedure anxiety/claustrophobia for up to 2 doses. Take 1 tablet 45 minutes prior to procedure. A second dose (1 tablet) is available if needed during procedure appointment.   losartan  (COZAAR ) 50 MG tablet 50 mg, Oral, Daily (standard)   melatonin 5 mg tablet 5 mg, Nightly   metFORMIN  (GLUCOPHAGE ) 500 MG tablet 500 mg, Oral, Daily   mycophenolate  (CELLCEPT ) 500 mg tablet 1,000 mg, Oral, 2 times a day (standard)   naloxone  (NARCAN ) 4 mg nasal spray 4 mg, Alternating Nares, Once as needed, One spray in either nostril once for known/suspected opioid overdose. May repeat every 2-3 minutes in alternating nostril til EMS arrives   norethindrone  (MICRONOR ) 0.35 mg tablet 1 tablet, Oral, Daily (standard)   omeprazole  (PRILOSEC) 20 MG capsule 20 mg, Oral, Daily (standard)   oxyCODONE  (ROXICODONE ) 5 MG immediate release tablet 5 mg, Oral, Daily PRN   phentermine  30 MG capsule 30 mg, Oral, Every morning   predniSONE  (DELTASONE ) 1 MG tablet 2 mg, Oral, Daily (standard)   primidone  (MYSOLINE ) 50 MG tablet 25 mg, Oral, Nightly, 0.5 tab nightly x1 wk, then 1 tab nightly   tizanidine  (ZANAFLEX ) 2 MG tablet 2 mg, Oral, Every 8 hours PRN   traZODone  (DESYREL ) 50 MG tablet 100 mg, Oral, Nightly        Objective:     Vitals  BP 112/64  - Pulse 88  - Ht 149.9 cm (4' 11)  - Wt 72.3 kg (159 lb 8 oz)  - LMP 02/08/2022 Comment: LMP 02/08/2022 continuous OCP - BMI 32.22 kg/m??      Wt Readings from Last 3 Encounters:   03/28/24 72.3 kg (159 lb 8 oz)   03/18/24 72.5 kg (159 lb 12.8 oz)   02/29/24 74.4 kg (164 lb)       Physical Exam  General:  Pleasant female sitting in chair in nad.   Neck: Supple, JVP normal.   Resp:   CTAB bilaterally with normal WOB.   Cardio:  RRR without m/r/g.   Abdomen:   Soft, non-distended, non-tender.   Extremities: Warm well-perfused bilaterally. No edema bilaterally.   MSK: No joint swelling or erythema. No gross deformities.   Skin: No rashes   Neuro: CN II-XII grossly intact. Strength grossly intact.    Psych: Alert and oriented x3. Appropriate mood.      ECG (03/28/24) - independently interpreted.  None today.    Sinus rhythm, normal axis, low voltage precordial leads, unchanged from prior      Most Recent Labs   Lab Results   Component Value Date    NA 140 02/09/2024    K 3.8 02/09/2024    CL 106 02/09/2024    CO2 25.4 02/09/2024     Lab Results   Component Value Date    BUN 10 02/09/2024    BUN 10 06/02/2023    BUN 6 (L) 08/15/2014    BUN 8 08/14/2014  Lab Results   Component Value Date    Creatinine Whole Blood, POC 0.9 10/21/2022    Creatinine/CP 0.51 (L) 08/27/2010    Creatinine 0.81 02/09/2024    Creatinine 0.90 09/17/2023    Creatinine 0.73 11/16/2014    Creatinine 0.56 (L) 09/21/2014     No results found for: PROBNP  Lab Results   Component Value Date    Cholesterol, Total 146 09/17/2023    Triglycerides 93 09/17/2023    Cholesterol, HDL 46 09/17/2023    Cholesterol, Non-HDL, Calculated 100 09/17/2023    Cholesterol, LDL, Calculated 81 09/17/2023

## 2024-03-24 NOTE — Unmapped (Unsigned)
Do you need to see her first?

## 2024-03-28 DIAGNOSIS — E7849 Other hyperlipidemia: Principal | ICD-10-CM

## 2024-03-28 DIAGNOSIS — R0609 Other forms of dyspnea: Principal | ICD-10-CM

## 2024-03-28 DIAGNOSIS — M329 Systemic lupus erythematosus, unspecified: Principal | ICD-10-CM

## 2024-03-28 DIAGNOSIS — I319 Disease of pericardium, unspecified: Principal | ICD-10-CM

## 2024-03-28 DIAGNOSIS — M3212 Pericarditis in systemic lupus erythematosus: Principal | ICD-10-CM

## 2024-03-28 DIAGNOSIS — I10 Essential (primary) hypertension: Principal | ICD-10-CM

## 2024-03-28 NOTE — Unmapped (Signed)
 Thank you for coming to see [place] today at [facility] at [facility]. Please call us at [phone] if you have any questions.

## 2024-03-29 DIAGNOSIS — G8929 Other chronic pain: Principal | ICD-10-CM

## 2024-03-29 MED ORDER — DULOXETINE 30 MG CAPSULE,DELAYED RELEASE
ORAL_CAPSULE | 2 refills | 0.00000 days
Start: 2024-03-29 — End: ?

## 2024-03-29 MED ORDER — OXYCODONE 5 MG TABLET
ORAL_TABLET | Freq: Every day | ORAL | 0 refills | 30.00000 days | Status: CP | PRN
Start: 2024-03-29 — End: ?

## 2024-03-29 NOTE — Unmapped (Signed)
 Oxycodone  refill  Last ov: 02/09/2024  Next ov: 05/27/2024

## 2024-03-30 MED ORDER — DULOXETINE 30 MG CAPSULE,DELAYED RELEASE
ORAL_CAPSULE | ORAL | 2 refills | 0.00000 days | Status: CP
Start: 2024-03-30 — End: ?

## 2024-03-30 NOTE — Unmapped (Signed)
 Refill request received from patient.      Medication Requested: Duloxetine    Last Office Visit: 03/18/2024   Next Office Visit: 04/14/2024  Last Prescriber: Telhan Gaurav     Nurse refill requirements met? No  If not met, why:     Sent to: Provider for signing  If sent to provider, which provider?: Telhan Gaurav

## 2024-03-31 NOTE — Unmapped (Signed)
 [facility] Refill Coordination Note    8.21.25 - spoke w/ pt obtained new CC and informed pt of copay     Julia Bates, DOB: July 10, 1986  Phone: [phone] (home)       All above HIPAA information was verified with patient.         03/31/2024     1:07 PM   Specialty Rx Medication Refill Questionnaire   Which Medications would you like refilled and shipped? Cellcept  500 mg   Please list all current allergies: Sulfa   Have you missed any doses in the last 30 days? No   Have you had any changes to your medication(s) since your last refill? No   How much of each medication do you have remaining at home? (eg. number of tablets, injections, etc.) 1 week   Have you experienced any side effects in the last 30 days? No   Please enter the full address (street address, city, state, zip code) where you would like your medication(s) to be delivered to. [address] , [place] [address]   Please specify on which day you would like your medication(s) to arrive. Note: if you need your medication(s) within 3 days, please call the pharmacy to schedule your order at [phone]  04/05/2024   Has your insurance changed since your last refill? No   Would you like a pharmacist to call you to discuss your medication(s)? No   Do you require a signature for your package? (Note: if we are billing Medicare Part B or your order contains a controlled substance, we will require a signature) No   I have been provided my out of pocket cost for my medication and approve the pharmacy to charge the amount to my credit card on file. No   Additional Information Confirm   Additional Comments: Please call for updated credit card         Completed refill call assessment today to schedule patient's medication shipment from the [facility] [phone]).  All relevant notes have been reviewed.       Confirmed patient received a Conservation officer, historic buildings and a Surveyor, mining with first shipment. The patient will receive a drug information handout for each medication shipped and additional FDA Medication Guides as required.         REFERRAL TO PHARMACIST     Referral to the pharmacist: Not needed      [facility]     Shipping address confirmed in Epic.     Delivery Scheduled: Yes, Expected medication delivery date: 8.26.25.     Medication will be delivered via Next Day Courier to the prescription address in Epic WAM.    Julia Bates   [facility] Pharm[facility] Specialty Technician

## 2024-04-01 DIAGNOSIS — E66811 Obesity, Class I, BMI 30-34.9: Principal | ICD-10-CM

## 2024-04-01 NOTE — Unmapped (Signed)
 Work on flexibility and resistance exercises to help with mobility and pain.  Add more anti-inflammatory fruits vegetables, whole grains and limit inflammatory foods (processed foods, meats, fried foods and sugary foods/drinks.)

## 2024-04-01 NOTE — Unmapped (Signed)
 Nutrition Consult Note      Visit Type: Initial  assessment for  MNT   Visit Count: 1    Referring Provider:   Geralene Levorn Geralds, * , Referral Date : 02/29/2024    Reason for Referral:   BMI>30 with recent weight gain.      Julia Bates is a 38 y.o. female seen for nutrition counseling. Their active problem list, medication list, allergies, family history, social history, and lab results were reviewed.     Goals/Plan:    Patient Stated Nutrition Goals & Status  Work on flexibility and resistance exercises to help with mobility and pain.  Add more anti-inflammatory fruits vegetables, whole grains and limit inflammatory foods (processed foods, meats, fried foods and sugary foods/drinks.)    Nutrition goals reviewed, barriers identified and addressed, and patient ability to complete goals assessed.     Patient History / Progress Since last visit:   New patient      Nutrition Assessment:    Weight History: see chart below  Usual Weight: N/A  Previous attempts at weight management: recently tried cutting out sugar, sodas and cutting back on bread.  Wt Readings from Last 6 Encounters:   04/01/24 72.3 kg (159 lb 6.3 oz)   03/28/24 72.3 kg (159 lb 8 oz)   03/18/24 72.5 kg (159 lb 12.8 oz)   02/29/24 74.4 kg (164 lb)   02/09/24 73.9 kg (163 lb)   01/29/24 73.5 kg (162 lb)     Present height 149.9 cm (4' 11.02)  Present weight 72.3 kg (159 lb 6.3 oz)  Current BMI (!) 32.18    Waist Circumference (for BMI >25 and <35 without diabetes):   Telehealth visit unable to assess    Allergies:   Allergies[1]    Relevant Medications, Herbs, Supplements:  Current Medications[2]    Do you have any concerns about taking or affording your medications?  No (If Yes, consider [facility] referral ([id number]) )    BP History:  BP Readings from Last 3 Encounters:   03/28/24 112/64   02/29/24 87/56   02/09/24 101/72        Relevant Labs:  Hemoglobin A1C (%)   Date Value   08/13/2010 5.3      Lab Results   Component Value Date    CHOL 146 09/17/2023    CHOL 206 (H) 03/18/2022     Lab Results   Component Value Date    HDL 46 09/17/2023    HDL 60 03/18/2022     Lab Results   Component Value Date    LDL 81 09/17/2023    LDL 111 (H) 03/18/2022     Lab Results   Component Value Date    TRIG 93 09/17/2023    TRIG 175 (H) 03/18/2022      Lab Results   Component Value Date    VITDTOTAL 32.8 02/09/2024     No results found for: VITAMINB12  Lab Results   Component Value Date    HGB 12.6 02/09/2024    HCT 38.0 02/09/2024          Self-Monitoring Blood Glucose: N/A    Physical Activity:  Type: no regular exercise   Frequency:n/a  Duration: n/a  Barriers: n/a    Nutrition History:  Dietary Restrictions: No known food allergies or intolerances  Gastrointestinal Issues: Denies issues  Hunger and Satiety: Denies issues    Usual Intake:  Time Intake   Breakfast 7:00 got up yesterday: granola bar, water  Snack (AM) N/a   Lunch 2-3:00 granola bar & fruit   Snack (PM) N/a   Dinner 6:30 chicken leg or pc of steak  Last night: small pc steak, rice, water   Snack (HS) N/a     Food-Related History:  Beverages:  water, lemonade, apple juice  Dining Out: Rarely   Who Prepares the Meals? mom  Who does the grocery shopping? mom  Other:   04/01/2024  Telehealth video visit with patient at home.  Patient is on disability since July 2023.  Was working at [employer] in [place]. In July 2023 was hospitalized for 7 days. Oct was hospitalized for 7 days.  Has been out of work for 2 years but started gaining weight in the past couple of months.  No routine now--takes naps and tries to get sleep whenever she can.   Has trouble sleeping--goes to sleep between 11pm-4am. Wakes up 3:00 and can't go back to sleep. Wakes up 8-8:30. Lives at home with mom. Insurance wouldn't pay for [employer] .  Did PT and pool therapy.    Cut out sodas, cut bread on bread, cut out sugar  Taking 500 mg metformin  1 x day, Phentermine  30 mg 1 time/day.    Eating behaviors:  N/A    Sleep & Stress  How many hours on average do you sleep each night: broken and disrupted sleep  Stressors:  health concerns, disrupted sleep    Energy Needs:    Resting Metabolic Rate (RMR): 1300 calories (Mifflin-St Jeor equation)    Total Energy Expenditure: 1600 calories Sedentary   Recommended Energy Intake for weight Loss: 1400-1500 calories   50-60 g protein (0.8 g/kg)    Nutrition Diagnosis:  Overweight/obesity related to excess caloric intake above metabolic needs and limited physical activity as evidenced by self reported recall and BMI>30    Nutrition Intervention:  Nutrition Counseling  Nutrition Education  Work on flexibility and resistance exercises to help with mobility and pain.  Add more anti-inflammatory fruits vegetables, whole grains and limit inflammatory foods (processed foods, meats, fried foods and sugary foods/drinks.)    Materials Provided:  none    Social Determinants of Health interventions provided: N/A - no concerns identified.     Length of visit was 27 minutes  2 unit(s) billed per insurance    Follow-up:  No follow-ups on file.     Greig Share, RD/LDN        PCP: Geralene Levorn Geralds, FNP  Address on File: [address] [place], [place] [address], [facility] [place]      Verified as Current Location: Yes  Extended Emergency Contact Information  Primary Emergency Contact: Bourcier,Blanca   [place]  Mobile Phone: [phone]  Relation: Mother  Preferred language: ENGLISH  Secondary Emergency Contact: Cavanagh,Marla  Address: [address]           [place], [place] [address] [place] Phone: [phone]  Relation: Sister  Father: Marva, Hendryx  Address: [address]           [place], [place] [address] [place]  Home Phone: [phone]  Mobile Phone: [phone]     Verified Behavioral Health Emergency Contact: N/A      The patient reports they are physically located in [place]  and is currently: at home. I conducted a audio/video visit. I spent 27 minutes on the video call with the patient. I spent an additional 20 minutes on pre- and post-visit activities on the  date of service .            [1]   Allergies  Allergen Reactions    Sulfa (Sulfonamide Antibiotics) Nausea And Vomiting and Other (See Comments)     Other reaction(s): FEVER   [2]   Current Outpatient Medications:     calcium carbonate-vitamin D3 600 mg(1,500mg ) -200 unit per tablet, Take 1 tablet by mouth Two (2) times a day., Disp: , Rfl:     diclofenac  sodium (VOLTAREN ) 1 % gel, Apply 2 g topically four (4) times a day., Disp: 100 g, Rfl: 5    DULoxetine  (CYMBALTA ) 30 MG capsule, TAKE 1 CAPSULE (30 MG TOTAL) BY MOUTH DAILY AND 2 CAPSULES (60 MG TOTAL) NIGHTLY, Disp: 90 capsule, Rfl: 2    ergocalciferol , vitamin D2, (VITAMIN D2 ORAL), Take 1,000 Units by mouth daily with evening meal., Disp: , Rfl:     hydroxychloroquine  (PLAQUENIL ) 200 mg tablet, TAKE 1.5 TABLETS BY MOUTH DAILY. IF UNABLE TO TOLERATE THE SMELL OF SPLITTING TAB -- CAN TAKE 200 MG ON MWF, 400 MG OTHER DAYS OF THE WEEK, Disp: 540 tablet, Rfl: 3    hydrOXYzine  (ATARAX ) 25 MG tablet, Take 1 tablet (25 mg total) by mouth every eight (8) hours as needed., Disp: 270 tablet, Rfl: 0    LORazepam  (ATIVAN ) 0.5 MG tablet, Take 1 tablet (0.5 mg total) by mouth once as needed for anxiety for up to 2 doses. Take 1 tablet (0.5 mg total) by mouth once as needed for procedure anxiety/claustrophobia for up to 2 doses. Take 1 tablet 45 minutes prior to procedure. A second dose (1 tablet) is available if needed during procedure appointment., Disp: 2 tablet, Rfl: 0    losartan  (COZAAR ) 50 MG tablet, Take 1 tablet (50 mg total) by mouth daily., Disp: 90 tablet, Rfl: 1    melatonin 5 mg tablet, Take 1 tablet (5 mg total) by mouth nightly., Disp: , Rfl:     metFORMIN  (GLUCOPHAGE ) 500 MG tablet, Take 1 tablet (500 mg total) by mouth in the morning., Disp: 90 tablet, Rfl: 1    mycophenolate  (CELLCEPT ) 500 mg tablet, Take 2 tablets (1,000 mg total) by mouth two (2) times a day., Disp: 360 tablet, Rfl: 3    naloxone  (NARCAN ) 4 mg nasal spray, 1 spray (4 mg total) into alternating nostrils once as needed for up to 1 dose. One spray in either nostril once for known/suspected opioid overdose. May repeat every 2-3 minutes in alternating nostril til EMS arrives, Disp: 1 each, Rfl: 1    norethindrone  (MICRONOR ) 0.35 mg tablet, TAKE 1 TABLET BY MOUTH DAILY, Disp: 84 tablet, Rfl: 1    omeprazole  (PRILOSEC) 20 MG capsule, Take 1 capsule (20 mg total) by mouth daily., Disp: 90 capsule, Rfl: 2    oxyCODONE  (ROXICODONE ) 5 MG immediate release tablet, Take 1 tablet (5 mg total) by mouth daily as needed for pain., Disp: 30 tablet, Rfl: 0    phentermine  30 MG capsule, Take 1 capsule (30 mg total) by mouth every morning., Disp: 30 capsule, Rfl: 1    predniSONE  (DELTASONE ) 1 MG tablet, Take 2 tablets (2 mg total) by mouth daily., Disp: 180 tablet, Rfl: 0    primidone  (MYSOLINE ) 50 MG tablet, Take 0.5 tablets (25 mg total) by mouth nightly. 0.5 tab nightly x1 wk, then 1 tab nightly, Disp: 30 tablet, Rfl: 2    tizanidine  (ZANAFLEX ) 2 MG tablet, Take 1 tablet (2 mg total) by mouth every eight (8) hours as needed., Disp:  270 tablet, Rfl: 0    traZODone  (DESYREL ) 50 MG tablet, Take 2 tablets (100 mg total) by mouth nightly., Disp: 180 tablet, Rfl: 1

## 2024-04-04 MED FILL — MYCOPHENOLATE MOFETIL 500 MG TABLET: ORAL | 30 days supply | Qty: 120 | Fill #5

## 2024-04-07 MED ORDER — TRAZODONE 50 MG TABLET
ORAL_TABLET | Freq: Every evening | ORAL | 1 refills | 90.00000 days
Start: 2024-04-07 — End: 2025-04-07

## 2024-04-07 NOTE — Unmapped (Signed)
 Addended by: Ernestene Coover M on: 04/07/2024 12:21 PM     Modules accepted: Orders

## 2024-04-08 DIAGNOSIS — M47817 Spondylosis without myelopathy or radiculopathy, lumbosacral region: Principal | ICD-10-CM

## 2024-04-08 MED ORDER — TRAZODONE 50 MG TABLET
ORAL_TABLET | Freq: Every evening | ORAL | 1 refills | 90.00000 days | Status: CP
Start: 2024-04-08 — End: 2025-04-08

## 2024-04-08 NOTE — Unmapped (Signed)
 Addended by: GERALENE LEVORN ORN on: 04/08/2024 05:03 PM     Modules accepted: Orders

## 2024-04-12 MED ORDER — LORAZEPAM 0.5 MG TABLET
ORAL_TABLET | Freq: Once | ORAL | 0 refills | 0.00000 days | PRN
Start: 2024-04-12 — End: ?

## 2024-04-12 NOTE — Unmapped (Signed)
 04/12/24 1027   Pre-op Phone Call   Surgery Time Verified Yes   Expected Arrival Time 1230   Arrival Time Verified Yes   Surgery Location Verified Yes   Ride and Caregiver Arranged Yes     Pre-procedure call. Denies recent infection/antibiotics. Denies taking any recent blood thinners/NSAIDs.

## 2024-04-13 MED ORDER — LORAZEPAM 0.5 MG TABLET
ORAL_TABLET | Freq: Once | ORAL | 0 refills | 0.00000 days | PRN
Start: 2024-04-13 — End: ?

## 2024-04-13 NOTE — Unmapped (Signed)
 Patient was prescribed one time Rx for pre procedure.

## 2024-04-14 ENCOUNTER — Ambulatory Visit
Admit: 2024-04-14 | Discharge: 2024-04-15 | Payer: Medicaid (Managed Care) | Attending: Student in an Organized Health Care Education/Training Program | Primary: Student in an Organized Health Care Education/Training Program

## 2024-04-14 ENCOUNTER — Inpatient Hospital Stay: Admit: 2024-04-14 | Discharge: 2024-04-15 | Payer: Medicaid (Managed Care)

## 2024-04-14 MED ADMIN — bupivacaine (PF) (MARCAINE) 0.25 % (2.5 mg/mL) injection (PF): @ 17:00:00 | Stop: 2024-04-14

## 2024-04-14 MED ADMIN — lidocaine (PF) (XYLOCAINE-MPF) 10 mg/mL (1 %) injection: @ 17:00:00 | Stop: 2024-04-14

## 2024-04-14 NOTE — Unmapped (Signed)
 PM&R - [facility]     PATIENT NAME: Julia Bates  PATIENT MRN: [id number]  DATE OF SURGERY: 04/14/2024     TITLE OF PROCEDURE:     1) Lumbar Medial Branch Blocks #1/2     INDICATION AND PHYSICAL EXAM: Julia Bates's pain is facet joint (z-joint) mediated disease that is aggravated by hyperextension, rotation or lateral bending.  The patient has point tenderness of the paraspinal musculature consistent with facet mediated pain. This was confirmed by provocative testing today.      FACET JOINT(S) TREATED: L4-L5 and L5-S1     NERVE ROOTS TREATED: L3, L4, and L5 DR     SIDE: Bilateral     PREOPERATIVE DIAGNOSES: Lumbar Facet Arthropathy     POSTOPERATIVE DIAGNOSES:  Same     LOCATION: [place]       SURGEON: Ladena KATHEE Blanch, DO     ASSISTANTS: Donna Dollar, MD     ANESTHESIA: Local only     DESCRIPTION OF OPERATIVE PROCEDURE: Prior to initiation of the procedure, informed consent was obtained, and the patient was made aware of the possibility of pain during the procedure or failure of the procedure to relieve pain. The patient was brought to the procedure room and positioned to comfort and optimal positioning for procedure.  A time-out was conducted with all members of the care team as per [facility] protocol.  Standard ASA monitors were placed. The patient was placed in the prone position and fluoroscopy was used to identify the planned levels of injection, then standard prep and drape were performed.     After identifying the proper lumbar levels for injection under AP fluoroscopy, the superior junction of the transverse processes (or sacral ala) and superior articular processes of each level as noted above and their corresponding nerve root was identified and marked at the skin with a sterile marking pen. Then, the skin was anesthetized at the marked sites using 1% lidocaine  administered via a 1.5 inch 25G needle, and 3.5in 22G needles were guided under intermittent AP, oblique, and cranial/caudal tilt fluoroscopy to the appropriate levels for injection. Adequate needle depth and position was confirmed and, after negative aspiration for blood, air, or CSF, the needles were injected with 0.5cc of 0.25% bupivacaine , then withdrawn. The patient did not experience lingering pain or paresthesia with injection.     The surgical site preparation was washed off of the patient. Hemostasis was appreciated and bandages were applied as needed. The patient tolerated the procedure well and was escorted to the recovery area. The patient was monitored for an appropriate amount of time and standard discharge instructions were reviewed.  The patient knows how to contact the clinic should she have any questions or problems, and she was discharged in stable condition without evidence of a motor block.     COMPLICATIONS: None     EBL: None     PLAN: The following day, the patient will call or send a message recounting their pain diary results. If the patient received significant pain relief of the treated area in a period 2-6 hours after the procedure while conducting normal pain-provoking activities, the patient will then be scheduled for a second MBB (if this was the first MBB) or RF denervation (if this was the second MBB) of the same levels on the same side (or patients most painful side).  There is no expectation that the patient will receive pain relief past the 2-6 hour time frame of the  local anesthetic.    Ladena Blanch, DO

## 2024-04-15 DIAGNOSIS — G4700 Insomnia, unspecified: Principal | ICD-10-CM

## 2024-04-15 MED ORDER — TRAZODONE 50 MG TABLET
ORAL_TABLET | Freq: Every evening | ORAL | 1 refills | 0.00000 days
Start: 2024-04-15 — End: ?

## 2024-04-17 DIAGNOSIS — R251 Tremor, unspecified: Principal | ICD-10-CM

## 2024-04-17 MED ORDER — PRIMIDONE 50 MG TABLET
ORAL_TABLET | Freq: Every evening | ORAL | 2 refills | 0.00000 days
Start: 2024-04-17 — End: 2025-04-17

## 2024-04-18 DIAGNOSIS — M3219 Other organ or system involvement in systemic lupus erythematosus: Principal | ICD-10-CM

## 2024-04-18 DIAGNOSIS — G4700 Insomnia, unspecified: Principal | ICD-10-CM

## 2024-04-18 MED ORDER — TRAZODONE 50 MG TABLET
ORAL_TABLET | Freq: Every evening | ORAL | 1 refills | 90.00000 days
Start: 2024-04-18 — End: ?

## 2024-04-18 MED ORDER — MYCOPHENOLATE MOFETIL 500 MG TABLET
ORAL_TABLET | Freq: Two times a day (BID) | ORAL | 0 refills | 90.00000 days | Status: CP
Start: 2024-04-18 — End: ?

## 2024-04-18 MED ORDER — PRIMIDONE 50 MG TABLET
ORAL_TABLET | Freq: Every evening | ORAL | 2 refills | 60.00000 days
Start: 2024-04-18 — End: 2025-04-18

## 2024-04-18 NOTE — Unmapped (Signed)
 Mycophenolate      Last Visit Date: 02/09/2024  Next Visit Date: 05/27/2024    Lab Results   Component Value Date    ALT 38 02/09/2024    AST 30 02/09/2024    ALBUMIN 3.8 02/09/2024    CREATININE 0.81 02/09/2024     Lab Results   Component Value Date    WBC 5.3 02/09/2024    HGB 12.6 02/09/2024    HCT 38.0 02/09/2024    PLT 249 02/09/2024     Lab Results   Component Value Date    NEUTROPCT 68.5 02/09/2024    LYMPHOPCT 18.6 02/09/2024    MONOPCT 11.9 02/09/2024    EOSPCT 0.4 02/09/2024    BASOPCT 0.6 02/09/2024

## 2024-04-18 NOTE — Unmapped (Signed)
 Refill request received from patient.      Medication Requested: Primidone  50 mg   Last Office Visit: 01/29/2024   Next Office Visit: 05/06/2024  Last Prescriber: Mona Jock, FNP    Nurse refill requirements met? No  If not met, why: Complimentary therapy not on file     Sent to: Provider for signing  If sent to provider, which provider?: Yingqiue Gao, FNP

## 2024-04-18 NOTE — Unmapped (Signed)
-----   Message from Loralee Marker, MD sent at 02/09/2024  5:37 PM EDT -----  Regarding: Elevated TSH    Hi Sophie Tamez,  Hope you are well. I saw our mutual patient today. I noticed that her TSH has been rising and is above 4, and I rechecked it today. She has been noting fatigue and weight gain so I was wondering if you would consider treating for subclinical hypothyroidism?    Thanks so much!  Saira Sheikh  (Rheumatology)

## 2024-04-18 NOTE — Unmapped (Signed)
 DUPLICATE REQUEST ALREADY RECEIVED FROM PATIENT VIA MYCHART AND ROUTED TO PROVIDER

## 2024-04-18 NOTE — Unmapped (Signed)
 Recommend continuing to monitor. Recheck TSH a next clinic visit.

## 2024-04-18 NOTE — Unmapped (Signed)
 Already sent to [facility] in [place].  Patient does not want it going to [facility]

## 2024-04-18 NOTE — Unmapped (Signed)
Duplicate request for Trazodone.

## 2024-04-22 ENCOUNTER — Encounter: Admit: 2024-04-22 | Discharge: 2024-04-22 | Payer: Medicaid (Managed Care) | Attending: Family | Primary: Family

## 2024-04-22 DIAGNOSIS — R040 Epistaxis: Principal | ICD-10-CM

## 2024-04-22 DIAGNOSIS — K130 Diseases of lips: Principal | ICD-10-CM

## 2024-04-22 DIAGNOSIS — N898 Other specified noninflammatory disorders of vagina: Principal | ICD-10-CM

## 2024-04-22 DIAGNOSIS — R0981 Nasal congestion: Principal | ICD-10-CM

## 2024-04-22 MED ORDER — DULOXETINE 30 MG CAPSULE,DELAYED RELEASE
ORAL_CAPSULE | 1 refills | 0.00000 days
Start: 2024-04-22 — End: ?

## 2024-04-22 NOTE — Unmapped (Signed)
 TeleHealth Video Encounter  This visit is conducted via [employer], applications.    Patient is currently located in the state of [place] .  I have identified myself to the patient and conveyed my credentials to Ms. Darnell  I have explained the capabilities and limitations of telemedicine and the patient and myself both agree that it is appropriate for their current circumstances/symptoms.  In case we get disconnected, patient's phone number is [phone] (home)    Patient has signed informed consent on file in medical record.  Is there someone else in the room? No.     Assessment and Plan:      Diagnosis ICD-10-CM Associated Orders   1. Vaginal itching  N89.8 Vaginitis Molecular Panel      2. Vaginal discharge  N89.8 Vaginitis Molecular Panel      3. Stuffy nose  R09.81 ENT      4. Epistaxis, recurrent  R04.0 ENT      5. Lip lesion  K13.0 Ambulatory referral to Dermatology        Assessment & Plan  Vaginal pruritus and discharge, possible candidiasis versus bacterial vaginosis  Intermittent vaginal itching and white discharge. Differential includes candidiasis and bacterial vaginosis. Previous partial relief with AZO yeast. No recent antibiotics or swimming. No STI concern.  - Order vaginal swab to differentiate between candidiasis and bacterial vaginosis.  - Advise self-swab this afternoon here at clinic lab.   - Prescribe treatment based on swab results.    Nasal congestion with recurrent epistaxis  Recurrent nasal congestion and epistaxis. Reports blood when blowing nose.  - Refer to ENT at [facility] for evaluation.    Lower lip lesion, possible lupus-related  Bump on lower lip, possibly lupus-related. Seeks evaluation for potential removal.  - Refer to dermatology in [place] for evaluation.              Return if symptoms worsen or fail to improve.    Total time spent with patient: 25 minutes     The patient reports they are physically located in   and is currently: at home. I conducted a audio/video visit. I spent 22 minutes on the video call with the patient. I spent an additional 3 minutes on pre- and post-visit activities on the date of service .       Subjective:       Julia Bates is a 38 y.o. female who presents for   Chief Complaint   Patient presents with    Vaginal Itching     Vaginal Itching x 5 days.  Denies odor.  Used Azo.    Vaginal Discharge     Vaginal discharge Wednesday only.    Flu Vaccine     Declines flu shot.           History of Present Illness  Julia Bates is a 38 year old female who presents with symptoms suggestive of a yeast infection. Onset of symptoms Monday.     She has been experiencing vulvar itching and a white discharge, which she describes as 'white cut hair.' The exact onset date of the symptoms is not specified. She has a history of yeast infections.    She has tried an over-the-counter treatment, AZO yeast, which provided some relief but did not completely resolve the symptoms. No recent swimming or antibiotic use. She mentions having had a nerve block injection in her back recently.    No concerns for sexually transmitted infections.    Patient requesting referral  to dermatology for a bump on her lip. Rheumatologist told her it was likely due to her lupus. She also requests a referral to ENT for nasal stuffiness and frequent nose bleeds when she blows her nose.        ROS:      Comprehensive 10 point ROS negative unless otherwise stated in the HPI.      [facility] Components:     Medication adherence and barriers to the treatment plan have been addressed. Opportunities to optimize healthy behaviors have been discussed. Patient / caregiver voiced understanding.      Past Medical/Surgical History:     Past Medical History[1]  Past Surgical History[2]    Family History:     Family History[3]    Social History:     Short Social History[4]    Allergies:     Azathioprine  and Sulfa (sulfonamide antibiotics)    Current Medications:     Current Medications[5]    Health Maintenance:     Health Maintenance   Topic Date Due    Pneumococcal Vaccine 0-49 (3 of 3 - PCV20 or PCV21) 08/06/2020    COVID-19 Vaccine (3 - Pfizer risk series) 10/27/2020    Influenza Vaccine (1) 04/11/2024    Serum Creatinine Monitoring  02/08/2025    Potassium Monitoring  02/08/2025    DTaP/Tdap/Td Vaccines (4 - Td or Tdap) 04/09/2025    HPV Cotest with Pap Smear (21-65)  01/30/2028    Pap Smear with Cotest HPV (21-65)  02/04/2028    Hepatitis C Screen  Completed       Immunizations:     Immunization History   Administered Date(s) Administered    COVID-19 VAC,MRNA,TRIS(12Y UP)(PFIZER)(GRAY CAP) 09/08/2020    COVID-19 VACC,MRNA,(PFIZER)(PF) 09/29/2020    DTP 12/08/1990    HEPATITIS B VACCINE ADULT, ADJUVANTED, IM(HEPLISAV B) 04/07/2024    HPV Quadrivalent (Gardasil) 03/24/2006, 06/01/2006, 10/02/2006    Human Papillomavirus Vaccine,9-Valent(PF) 04/07/2024    Influenza Vaccine Quad(IM)6 MO-Adult(PF) 06/05/2015, 05/06/2016, 07/07/2017, 07/06/2018, 04/29/2019, 05/17/2020    Influenza Virus Vaccine, unspecified formulation 05/06/2016, 07/07/2017    MMR 12/08/1990    Meningococcal C Conjugate 03/08/2004    Novel Influenza-h1n1-09, All Formulations 07/20/2008    PNEUMOCOCCAL POLYSACCHARIDE 23-VALENT 08/07/2015    PPD Test 05/20/2009    Pneumococcal Conjugate 13-Valent 04/10/2015    SHINGRIX-ZOSTER VACCINE (HZV),RECOMBINANT,ADJUVANTED(IM) 12/04/2021, 07/05/2022    TdaP 02/16/2012, 04/10/2015     I have reviewed and (if needed) updated the patient's problem list, medications, allergies, past medical and surgical history, social and family history.     Vital Signs:     Wt Readings from Last 3 Encounters:   04/01/24 72.3 kg (159 lb 6.3 oz)   03/28/24 72.3 kg (159 lb 8 oz)   03/18/24 72.5 kg (159 lb 12.8 oz)     Temp Readings from Last 3 Encounters:   04/14/24 37 ??C (98.6 ??F) (Temporal)   03/18/24 36.4 ??C (97.5 ??F)   02/29/24 36.6 ??C (97.8 ??F) (Oral)     BP Readings from Last 3 Encounters: 04/14/24 132/90   03/28/24 112/64   02/29/24 87/56     Pulse Readings from Last 3 Encounters:   04/14/24 93   03/28/24 88   02/29/24 72     Estimated body mass index is 32.18 kg/m?? as calculated from the following:    Height as of 04/01/24: 149.9 cm (4' 11.02).    Weight as of 04/01/24: 72.3 kg (159 lb 6.3 oz).  No height and weight on file for this  encounter.        Objective:      As part of this Video Visit, no in-person exam was conducted.  Video interaction permitted the following observations.    GEN: No acute distress.   SKIN: Color is normal. No rashes.  PSYCH: Appropriate affect, normal mood  RESP: Normal work of breathing, no retractions      Levorn LELON Snide, FNP              [1]   Past Medical History:  Diagnosis Date    Abnormal Pap smear of cervix 2010    Allergic     Sulfa    Cataract     Deep vein thrombosis    (CMS-HCC) May 24 2022    Dysplasia of cervix, 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    ECC pos CIN 1--12/19    Essential hypertension 09/23/2022    GERD (gastroesophageal reflux disease) Oct 23    HPV (human papilloma virus) infection     Low back pain     Lupus     Lupus     Osteoporosis     Pericarditis (HHS-HCC)     Rheumatoid arthritis    (CMS-HCC) 11/18/2015    SLE (systemic lupus erythematosus related syndrome)    (CMS-HCC)    [2]   Past Surgical History:  Procedure Laterality Date    CERVICAL BIOPSY  W/ LOOP ELECTRODE EXCISION      COLPOSCOPY      SKIN BIOPSY     [3]   Family History  Problem Relation Age of Onset    Glaucoma Father     Diabetes Father     Hypertension Father     Heart attack Father     Cataracts Father     Stroke Father     Heart failure Father 60 - 58    Neuropathy Mother 75 - 31    Osteoporosis Mother 64 - 41    No Known Problems Sister     Diabetes Sister     Diabetes Sister     Diabetes Sister     Diabetes Sister     Melanoma Neg Hx     Basal cell carcinoma Neg Hx     Squamous cell carcinoma Neg Hx     Macular degeneration Neg Hx     Blindness Neg Hx    [4]   Social History  Tobacco Use    Smoking status: Never     Passive exposure: Never    Smokeless tobacco: Never    Tobacco comments:     None   Vaping Use    Vaping status: Never Used   Substance Use Topics    Alcohol use: Never    Drug use: Never   [5]   Current Outpatient Medications   Medication Sig Dispense Refill    calcium carbonate-vitamin D3 600 mg(1,500mg ) -200 unit per tablet Take 1 tablet by mouth Two (2) times a day.      diclofenac  sodium (VOLTAREN ) 1 % gel Apply 2 g topically four (4) times a day. 100 g 5    DULoxetine  (CYMBALTA ) 30 MG capsule TAKE 1 CAPSULE (30 MG TOTAL) BY MOUTH DAILY AND 2 CAPSULES (60 MG TOTAL) NIGHTLY 90 capsule 2    ergocalciferol , vitamin D2, (VITAMIN D2 ORAL) Take 1,000 Units by mouth daily with evening meal.      hydroxychloroquine  (PLAQUENIL ) 200 mg tablet TAKE 1.5 TABLETS BY MOUTH DAILY. IF UNABLE TO TOLERATE THE SMELL OF  SPLITTING TAB -- CAN TAKE 200 MG ON MWF, 400 MG OTHER DAYS OF THE WEEK 540 tablet 3    hydrOXYzine  (ATARAX ) 25 MG tablet Take 1 tablet (25 mg total) by mouth every eight (8) hours as needed. 270 tablet 0    LORazepam  (ATIVAN ) 0.5 MG tablet Take 1 tablet (0.5 mg total) by mouth once as needed for anxiety for up to 2 doses. Take 1 tablet (0.5 mg total) by mouth once as needed for procedure anxiety/claustrophobia for up to 2 doses. Take 1 tablet 45 minutes prior to procedure. A second dose (1 tablet) is available if needed during procedure appointment. 2 tablet 0    losartan  (COZAAR ) 50 MG tablet Take 1 tablet (50 mg total) by mouth daily. 90 tablet 1    melatonin 5 mg tablet Take 1 tablet (5 mg total) by mouth nightly.      metFORMIN  (GLUCOPHAGE ) 500 MG tablet Take 1 tablet (500 mg total) by mouth in the morning. 90 tablet 1    mycophenolate  (CELLCEPT ) 500 mg tablet Take 2 tablets (1,000 mg total) by mouth two (2) times a day. 360 tablet 0    naloxone  (NARCAN ) 4 mg nasal spray 1 spray (4 mg total) into alternating nostrils once as needed for up to 1 dose. One spray in either nostril once for known/suspected opioid overdose. May repeat every 2-3 minutes in alternating nostril til EMS arrives 1 each 1    norethindrone  (MICRONOR ) 0.35 mg tablet TAKE 1 TABLET BY MOUTH DAILY 84 tablet 1    omeprazole  (PRILOSEC) 20 MG capsule Take 1 capsule (20 mg total) by mouth daily. 90 capsule 2    oxyCODONE  (ROXICODONE ) 5 MG immediate release tablet Take 1 tablet (5 mg total) by mouth daily as needed for pain. 30 tablet 0    phentermine  30 MG capsule Take 1 capsule (30 mg total) by mouth every morning. 30 capsule 1    predniSONE  (DELTASONE ) 1 MG tablet Take 2 tablets (2 mg total) by mouth daily. 180 tablet 0    primidone  (MYSOLINE ) 50 MG tablet Take 1 tablet (50 mg total) by mouth nightly. 30 tablet 2    tizanidine  (ZANAFLEX ) 2 MG tablet Take 1 tablet (2 mg total) by mouth every eight (8) hours as needed. 270 tablet 0    traZODone  (DESYREL ) 50 MG tablet Take 2 tablets (100 mg total) by mouth nightly. 180 tablet 1     No current facility-administered medications for this visit.

## 2024-04-24 DIAGNOSIS — B3731 Vaginal yeast infection: Principal | ICD-10-CM

## 2024-04-24 DIAGNOSIS — M7918 Myalgia, other site: Principal | ICD-10-CM

## 2024-04-24 MED ORDER — FLUCONAZOLE 150 MG TABLET
ORAL_TABLET | Freq: Once | ORAL | 0 refills | 1.00000 days | Status: CP
Start: 2024-04-24 — End: 2024-04-24

## 2024-04-24 MED ORDER — TIZANIDINE 2 MG TABLET
ORAL_TABLET | Freq: Three times a day (TID) | ORAL | 2 refills | 0.00000 days | PRN
Start: 2024-04-24 — End: ?

## 2024-04-24 NOTE — Unmapped (Signed)
ENT referral has been submitted

## 2024-04-24 NOTE — Unmapped (Signed)
 Dermatology referral has been submitted.

## 2024-04-25 DIAGNOSIS — G8929 Other chronic pain: Principal | ICD-10-CM

## 2024-04-25 MED ORDER — OXYCODONE 5 MG TABLET
ORAL_TABLET | Freq: Every day | ORAL | 0 refills | 30.00000 days | PRN
Start: 2024-04-25 — End: ?

## 2024-04-25 MED ORDER — TIZANIDINE 2 MG TABLET
ORAL_TABLET | Freq: Three times a day (TID) | ORAL | 0 refills | 30.00000 days | Status: CP | PRN
Start: 2024-04-25 — End: ?

## 2024-04-25 NOTE — Unmapped (Signed)
 Tizanidine  refill  Last ov: 02/09/2024  Next ov: 05/27/2024

## 2024-04-26 MED ORDER — OXYCODONE 5 MG TABLET
ORAL_TABLET | Freq: Every day | ORAL | 0 refills | 30.00000 days | Status: CP | PRN
Start: 2024-04-26 — End: ?

## 2024-04-26 MED ORDER — DULOXETINE 30 MG CAPSULE,DELAYED RELEASE
ORAL_CAPSULE | ORAL | 1 refills | 0.00000 days | Status: CP
Start: 2024-04-26 — End: ?

## 2024-04-26 NOTE — Unmapped (Signed)
 Oxycodone  refill  Last ov: 02/09/2024  Next ov: 05/27/2024

## 2024-05-02 ENCOUNTER — Ambulatory Visit
Admit: 2024-05-02 | Discharge: 2024-05-03 | Payer: Medicaid (Managed Care) | Attending: Student in an Organized Health Care Education/Training Program | Primary: Student in an Organized Health Care Education/Training Program

## 2024-05-02 DIAGNOSIS — M8580 Other specified disorders of bone density and structure, unspecified site: Principal | ICD-10-CM

## 2024-05-02 NOTE — Unmapped (Signed)
 Look up Julia Bates on YouTube for exercises for osteoporosis.    I think that you are doing overall well, especially since you are reducing your prednisone .  Your bone mineral density is lower than expected, but without fracture (and give stability), I would favor conservative treatment.  Please consider increasing weight bearing exercise.  For now, I think you can follow up with your Rheumatologist, but if they have questions, they certainly can get in touch with me.  I am also happy to see you back if you have questions.  You are considered a return for 3 years, after that you will need a referral.   I would recommend a repeat DEXA scan in two years, if things have gotten worse, then you should come back to see me.

## 2024-05-02 NOTE — Unmapped (Signed)
 Endocrine Consult Note    Assessment and Plan. [name] is a 38 y.o.  with a history of SLE and ostepenia who is seen for evaluation of osteopenia and .    1. Osteopenia. At risk due to long-term steroid use and amenorrhea. Previously put on OCPs, but with recent clot, now on progesterone only for the last 6 months. She is now amenorrheic, but this may be due to thinned uterine lining. Prior DEXA has been largely stable with improvement in the spine. Repeat DXA stable with continued osteopenia. Given that she is young and without fracture, I would be hesitant to treat her regardless. We discussed just following with rheumatology, since we would not treat unless significant change.  Plan to have her follow-up as needed.  Encouraged calcium/vitamin D  supplementation and weight bearing exercise.  Refer back to endocrine with fracture or other concerns.    Stanton Sage, MD, PhD  Division of Endocrinology, Diabetes, and Metabolism  Phone:  [phone]   Fax:  [phone]    ----------------------------  Reason For Visit: Osteopenia.     Subjective:     History of Present Illness. Julia Bates is a 38 y.o. with a history of SLE and osteopoenia who I am asked to see in consultation by Dr. Ladena Gerhard Blanch for follow-up of osteopenia.      History of Present Illness  Julia Bates  experiences a sensation of 'regular, like, bump of bones' in her back and has received a nerve blocker injection from neurology for pain management. The pain's primary source is unclear. She is currently on a low dose of prednisone  at 2 mg daily, which she attempted to reduce to 1 mg but experienced increased joint pain, making it intolerable.    She has a history of long-term steroid use and has significantly reduced her dosage. She has not experienced any major fractures, only a toe fracture in the past. She attempts to exercise but finds it challenging due to bone pain and back pain.    She is on a progesterone-only birth control, norethindrone , with a 28-day supply. She was initially referred to endocrinology for amenorrhea, which has persisted despite treatment.    She reports new symptoms of headaches and dizziness, particularly at night and upon waking, which began approximately a week ago following her nerve blocker injection. No fevers but notes waking up in the middle of the night with dizziness and needing to hold onto something when walking due to a sensation of spinning. These symptoms improve when she lies back down.      Background. Initially, presented to see Dr. Hosie for ammenorrhea. She was put on a birth control and started having periods. She switched from estrogen in 05/2022 because of blood clots. She stopped blood thinners in April. She started seeing Dr. Hershal for osteopenia. She started doing blood work. She has not had a bone mineral density since then. She has not fractured. Her steroid dose goes up and down, because when she wakes up she have pain. She is down to 3mg  but she is having pain. She is planning to start rituximab . She has had lupus since 2011.     Primary risk factors are amenorrhea and steroid use. Otherwise, she denies falls. PTH, vitamin D , and calcium has been appropriate.    Dexa:  2024:  Z-score: Fem neck -2.2; Total hip: -1.6, Total spine -0.8.  2022: Z-score: Fem neck -2.0; Total hip: -1.8, Total spine -0.1.  2019: Z-score: Fem neck -2.2;  Total hip: -1.9, Total spine -1.2.    She mostly came because she is having pain in her flank. She says it comes in the afternoon. It feels like stabbing pain. She drinks a lot of water and it seems to improve.     Osteopenia:  02/15/18 DXA: LS Z -1.2, TF 1 -1.9, Fem neck T -2.3. Not changed sig since last DXA in 2017.   Calcium 9.6, vitamin D  30, Alk phos 87. PTH WNL.   Risk factors: steroid use history and history of amenorrhea.         Medical History Surgical History   Lupus  Blood clots (2023) Kidney biopsy   Family History Social History   Family History   Problem Relation Age of Onset    Glaucoma Father     Diabetes Father     Hypertension Father     Heart attack Father     Cataracts Father     Stroke Father     Heart failure Father 64 - 20    Neuropathy Mother 77 - 20    Osteoporosis Mother 33 - 66    No Known Problems Sister     Diabetes Sister     Diabetes Sister     Diabetes Sister     Diabetes Sister     Melanoma Neg Hx     Basal cell carcinoma Neg Hx     Squamous cell carcinoma Neg Hx     Macular degeneration Neg Hx     Blindness Neg Hx       Lives with family  If she can, she hangs out with boyfriend  No alcohol, non-smoker       Allergies. Reviewed in Epic  Medications. Reviewed in Epic    Objective:     Physical Exam.  BP 111/79 (BP Site: L Arm, BP Position: Sitting)  - Pulse 92  - Ht 150 cm (4' 11.06)  - Wt 71.7 kg (158 lb)  - LMP 02/08/2022 Comment: continuous OCP - BMI 31.85 kg/m??   Wt Readings from Last 3 Encounters:   05/02/24 71.7 kg (158 lb)   04/01/24 72.3 kg (159 lb 6.3 oz)   03/28/24 72.3 kg (159 lb 8 oz)     Constitutional - alert, well appearing, no acute distress  ENT -  EOM's intact  Resp - normal work of breathing  MSK - no obvious joint deformity, sit to stand without issue. Minimal pain down spine.  Neurological - Mental status - alert, oriented  Psych - normal affect

## 2024-05-02 NOTE — Unmapped (Signed)
 Specialty Medication(s): mycophenolate     Ms.Messer has been dis-enrolled from the [facility] specialty pharmacy services as a result of a pharmacy change. The patient is now filling at [facility] home delivery.    Additional information provided to the patient: n/a    Belisa Eichholz S Kaicee Scarpino, PharmD  [facility] Specialty Pharmacist

## 2024-05-05 DIAGNOSIS — E66811 Obesity (BMI 30.0-34.9): Principal | ICD-10-CM

## 2024-05-05 MED ORDER — PHENTERMINE 30 MG CAPSULE
ORAL_CAPSULE | Freq: Every morning | ORAL | 1 refills | 30.00000 days
Start: 2024-05-05 — End: ?

## 2024-05-06 ENCOUNTER — Encounter: Admit: 2024-05-06 | Discharge: 2024-05-07 | Payer: Medicaid (Managed Care)

## 2024-05-06 DIAGNOSIS — R251 Tremor, unspecified: Principal | ICD-10-CM

## 2024-05-06 MED ORDER — PHENTERMINE 30 MG CAPSULE
ORAL_CAPSULE | Freq: Every morning | ORAL | 1 refills | 30.00000 days
Start: 2024-05-06 — End: ?

## 2024-05-06 MED ORDER — PRIMIDONE 50 MG TABLET
ORAL_TABLET | ORAL | 2 refills | 0.00000 days | Status: CP
Start: 2024-05-06 — End: ?

## 2024-05-07 DIAGNOSIS — E66811 Obesity (BMI 30.0-34.9): Principal | ICD-10-CM

## 2024-05-07 MED ORDER — PHENTERMINE 30 MG CAPSULE
ORAL_CAPSULE | Freq: Every day | ORAL | 0 refills | 0.00000 days
Start: 2024-05-07 — End: ?

## 2024-05-08 MED ORDER — PHENTERMINE 30 MG CAPSULE
ORAL_CAPSULE | Freq: Every day | ORAL | 0 refills | 30.00000 days | Status: CP
Start: 2024-05-08 — End: ?

## 2024-05-10 DIAGNOSIS — J310 Chronic rhinitis: Principal | ICD-10-CM

## 2024-05-10 MED ORDER — MUPIROCIN 2 % TOPICAL OINTMENT
TOPICAL | 1 refills | 0.00000 days | Status: CP
Start: 2024-05-10 — End: ?

## 2024-05-15 DIAGNOSIS — L299 Pruritus, unspecified: Principal | ICD-10-CM

## 2024-05-15 DIAGNOSIS — M3219 Other organ or system involvement in systemic lupus erythematosus: Principal | ICD-10-CM

## 2024-05-15 MED ORDER — HYDROXYZINE HCL 25 MG TABLET
ORAL_TABLET | Freq: Three times a day (TID) | ORAL | 0 refills | 0.00000 days | PRN
Start: 2024-05-15 — End: ?

## 2024-05-15 MED ORDER — PREDNISONE 1 MG TABLET
ORAL_TABLET | Freq: Every day | ORAL | 2 refills | 0.00000 days
Start: 2024-05-15 — End: ?

## 2024-05-17 MED ORDER — PREDNISONE 1 MG TABLET
ORAL_TABLET | Freq: Every day | ORAL | 3 refills | 90.00000 days | Status: CP
Start: 2024-05-17 — End: ?

## 2024-05-17 MED ORDER — HYDROXYZINE HCL 25 MG TABLET
ORAL_TABLET | Freq: Three times a day (TID) | ORAL | 3 refills | 90.00000 days | Status: CP | PRN
Start: 2024-05-17 — End: ?

## 2024-05-23 DIAGNOSIS — M7918 Myalgia, other site: Principal | ICD-10-CM

## 2024-05-23 MED ORDER — TIZANIDINE 2 MG TABLET
ORAL_TABLET | Freq: Three times a day (TID) | ORAL | 0 refills | 0.00000 days | PRN
Start: 2024-05-23 — End: ?

## 2024-05-24 DIAGNOSIS — E66811 Obesity (BMI 30.0-34.9): Principal | ICD-10-CM

## 2024-05-24 MED ORDER — METFORMIN 500 MG TABLET
ORAL_TABLET | Freq: Every day | ORAL | 1 refills | 0.00000 days
Start: 2024-05-24 — End: ?

## 2024-05-24 MED ORDER — TIZANIDINE 2 MG TABLET
ORAL_TABLET | Freq: Three times a day (TID) | ORAL | 0 refills | 30.00000 days | Status: CP | PRN
Start: 2024-05-24 — End: ?

## 2024-05-25 MED ORDER — METFORMIN 500 MG TABLET
ORAL_TABLET | Freq: Every day | ORAL | 0 refills | 30.00000 days | Status: CP
Start: 2024-05-25 — End: 2024-06-24

## 2024-05-26 DIAGNOSIS — G8929 Other chronic pain: Principal | ICD-10-CM

## 2024-05-26 MED ORDER — OXYCODONE 5 MG TABLET
ORAL_TABLET | Freq: Every day | ORAL | 0 refills | 30.00000 days | PRN
Start: 2024-05-26 — End: ?

## 2024-05-27 ENCOUNTER — Ambulatory Visit: Admit: 2024-05-27 | Discharge: 2024-05-28 | Payer: Medicaid (Managed Care) | Attending: Family | Primary: Family

## 2024-05-27 DIAGNOSIS — R21 Rash and other nonspecific skin eruption: Principal | ICD-10-CM

## 2024-05-27 DIAGNOSIS — M3219 Other organ or system involvement in systemic lupus erythematosus: Principal | ICD-10-CM

## 2024-05-27 DIAGNOSIS — M3212 Pericarditis in systemic lupus erythematosus: Principal | ICD-10-CM

## 2024-05-27 DIAGNOSIS — M7918 Myalgia, other site: Principal | ICD-10-CM

## 2024-05-27 DIAGNOSIS — G8929 Other chronic pain: Principal | ICD-10-CM

## 2024-05-27 MED ORDER — TIZANIDINE 2 MG TABLET
ORAL_TABLET | Freq: Every evening | ORAL | 0 refills | 90.00000 days | Status: CP
Start: 2024-05-27 — End: ?

## 2024-05-27 MED ORDER — MYCOPHENOLATE MOFETIL 500 MG TABLET
ORAL_TABLET | Freq: Two times a day (BID) | ORAL | 0 refills | 90.00000 days | Status: CP
Start: 2024-05-27 — End: ?

## 2024-05-27 MED ORDER — OXYCODONE 5 MG TABLET
ORAL_TABLET | Freq: Every day | ORAL | 0 refills | 30.00000 days | Status: CP | PRN
Start: 2024-05-27 — End: ?

## 2024-05-31 DIAGNOSIS — E038 Other specified hypothyroidism: Principal | ICD-10-CM

## 2024-05-31 DIAGNOSIS — E66811 Obesity (BMI 30.0-34.9): Principal | ICD-10-CM

## 2024-05-31 DIAGNOSIS — R6889 Other general symptoms and signs: Principal | ICD-10-CM

## 2024-05-31 DIAGNOSIS — Z131 Encounter for screening for diabetes mellitus: Principal | ICD-10-CM

## 2024-05-31 DIAGNOSIS — R058 Nonproductive cough: Principal | ICD-10-CM

## 2024-05-31 MED ORDER — BENZONATATE 100 MG CAPSULE
ORAL_CAPSULE | Freq: Three times a day (TID) | ORAL | 0 refills | 10.00000 days | Status: CP | PRN
Start: 2024-05-31 — End: 2024-06-07

## 2024-05-31 MED ORDER — PHENTERMINE 37.5 MG TABLET
ORAL_TABLET | Freq: Every day | ORAL | 1 refills | 30.00000 days | Status: CP
Start: 2024-05-31 — End: ?

## 2024-05-31 MED ORDER — METFORMIN 500 MG TABLET
ORAL_TABLET | Freq: Two times a day (BID) | ORAL | 2 refills | 30.00000 days | Status: CP
Start: 2024-05-31 — End: 2024-06-30

## 2024-05-31 MED ORDER — LEVOTHYROXINE 25 MCG TABLET
ORAL_TABLET | Freq: Every day | ORAL | 3 refills | 100.00000 days | Status: CP
Start: 2024-05-31 — End: 2025-07-05

## 2024-06-09 DIAGNOSIS — J309 Allergic rhinitis, unspecified: Principal | ICD-10-CM

## 2024-06-09 DIAGNOSIS — R040 Epistaxis: Principal | ICD-10-CM

## 2024-06-09 MED ORDER — MOMETASONE 50 MCG/ACTUATION NASAL SPRAY
Freq: Two times a day (BID) | NASAL | 7 refills | 50.00000 days | Status: CP
Start: 2024-06-09 — End: 2025-06-09

## 2024-06-13 DIAGNOSIS — G4700 Insomnia, unspecified: Principal | ICD-10-CM

## 2024-06-13 DIAGNOSIS — J309 Allergic rhinitis, unspecified: Principal | ICD-10-CM

## 2024-06-13 MED ORDER — MOMETASONE 50 MCG/ACTUATION NASAL SPRAY
Freq: Two times a day (BID) | NASAL | 11 refills | 0.00000 days | Status: CP
Start: 2024-06-13 — End: 2025-06-13

## 2024-06-13 MED ORDER — TRAZODONE 50 MG TABLET
ORAL_TABLET | Freq: Every evening | ORAL | 1 refills | 90.00000 days | Status: CP
Start: 2024-06-13 — End: ?

## 2024-06-20 ENCOUNTER — Ambulatory Visit
Admit: 2024-06-20 | Discharge: 2024-06-21 | Payer: Medicaid (Managed Care) | Attending: Student in an Organized Health Care Education/Training Program | Primary: Student in an Organized Health Care Education/Training Program

## 2024-06-20 DIAGNOSIS — M533 Sacrococcygeal disorders, not elsewhere classified: Principal | ICD-10-CM

## 2024-06-20 DIAGNOSIS — M545 Chronic bilateral low back pain without sciatica: Principal | ICD-10-CM

## 2024-06-20 DIAGNOSIS — M7918 Myalgia, other site: Principal | ICD-10-CM

## 2024-06-20 DIAGNOSIS — M47816 Spondylosis without myelopathy or radiculopathy, lumbar region: Principal | ICD-10-CM

## 2024-06-20 DIAGNOSIS — G8929 Other chronic pain: Principal | ICD-10-CM

## 2024-06-21 DIAGNOSIS — E66811 Obesity (BMI 30.0-34.9): Principal | ICD-10-CM

## 2024-06-21 MED ORDER — MELOXICAM 7.5 MG TABLET
ORAL_TABLET | Freq: Two times a day (BID) | ORAL | 2 refills | 30.00000 days | Status: CP | PRN
Start: 2024-06-21 — End: 2024-09-19

## 2024-06-21 MED ORDER — METFORMIN 500 MG TABLET
ORAL_TABLET | Freq: Every morning | ORAL | 0 refills | 90.00000 days | Status: CP
Start: 2024-06-21 — End: ?

## 2024-06-26 DIAGNOSIS — G8929 Other chronic pain: Principal | ICD-10-CM

## 2024-06-26 MED ORDER — OXYCODONE 5 MG TABLET
ORAL_TABLET | Freq: Every day | ORAL | 0 refills | 30.00000 days | PRN
Start: 2024-06-26 — End: ?

## 2024-06-27 MED ORDER — OXYCODONE 5 MG TABLET
ORAL_TABLET | Freq: Every day | ORAL | 0 refills | 30.00000 days | Status: CP | PRN
Start: 2024-06-27 — End: ?

## 2024-06-29 DIAGNOSIS — J309 Allergic rhinitis, unspecified: Principal | ICD-10-CM

## 2024-06-29 MED ORDER — MOMETASONE 50 MCG/ACTUATION NASAL SPRAY
Freq: Two times a day (BID) | NASAL | 11 refills | 0.00000 days | Status: CP
Start: 2024-06-29 — End: 2025-06-29

## 2024-07-12 ENCOUNTER — Encounter: Admit: 2024-07-12 | Discharge: 2024-07-13 | Payer: Medicaid (Managed Care)

## 2024-07-12 DIAGNOSIS — M328 Other forms of systemic lupus erythematosus: Principal | ICD-10-CM

## 2024-07-15 DIAGNOSIS — R058 Nonproductive cough: Principal | ICD-10-CM

## 2024-07-15 MED ORDER — BENZONATATE 100 MG CAPSULE
ORAL_CAPSULE | Freq: Three times a day (TID) | ORAL | 0 refills | 0.00000 days | PRN
Start: 2024-07-15 — End: 2024-07-22

## 2024-07-16 MED ORDER — MUPIROCIN 2 % TOPICAL OINTMENT
1 refills | 0.00000 days
Start: 2024-07-16 — End: ?

## 2024-07-18 DIAGNOSIS — J310 Chronic rhinitis: Principal | ICD-10-CM

## 2024-07-18 MED ORDER — MUPIROCIN 2 % TOPICAL OINTMENT
TOPICAL | 11 refills | 0.00000 days | Status: CP
Start: 2024-07-18 — End: ?

## 2024-07-20 DIAGNOSIS — L304 Erythema intertrigo: Principal | ICD-10-CM

## 2024-07-20 DIAGNOSIS — R21 Rash and other nonspecific skin eruption: Principal | ICD-10-CM

## 2024-07-20 MED ORDER — HYDROCORTISONE 2.5 % TOPICAL OINTMENT
Freq: Two times a day (BID) | TOPICAL | 1 refills | 0.00000 days | Status: CP
Start: 2024-07-20 — End: 2025-07-20

## 2024-07-26 ENCOUNTER — Encounter: Admit: 2024-07-26 | Discharge: 2024-07-26 | Payer: Medicaid (Managed Care)

## 2024-07-26 DIAGNOSIS — M328 Other forms of systemic lupus erythematosus: Principal | ICD-10-CM

## 2024-07-26 DIAGNOSIS — G8929 Other chronic pain: Principal | ICD-10-CM

## 2024-07-26 MED ORDER — OXYCODONE 5 MG TABLET
ORAL_TABLET | Freq: Every day | ORAL | 0 refills | 30.00000 days | Status: CP | PRN
Start: 2024-07-26 — End: ?

## 2024-07-30 ENCOUNTER — Emergency Department
Admit: 2024-07-30 | Discharge: 2024-07-30 | Disposition: A | Discharge status: Home / Self Care | Payer: Medicaid (Managed Care)

## 2024-07-30 MED ORDER — METHOCARBAMOL 500 MG TABLET
ORAL_TABLET | Freq: Two times a day (BID) | ORAL | 0 refills | 15.00000 days | Status: CP
Start: 2024-07-30 — End: 2024-08-14

## 2024-08-02 ENCOUNTER — Inpatient Hospital Stay: Admit: 2024-08-02 | Discharge: 2024-08-02 | Payer: Medicaid (Managed Care)

## 2024-08-14 DIAGNOSIS — M328 Other forms of systemic lupus erythematosus: Principal | ICD-10-CM

## 2024-08-15 ENCOUNTER — Encounter: Admit: 2024-08-15 | Discharge: 2024-08-16 | Payer: Medicare (Managed Care)

## 2024-08-15 DIAGNOSIS — R251 Tremor, unspecified: Principal | ICD-10-CM

## 2024-08-15 MED ORDER — PRIMIDONE 125 MG TABLET
ORAL_TABLET | ORAL | 2 refills | 0.00000 days | Status: CP
Start: 2024-08-15 — End: ?

## 2024-08-15 MED ORDER — PRIMIDONE 50 MG TABLET
ORAL_TABLET | ORAL | 2 refills | 0.00000 days | Status: CP
Start: 2024-08-15 — End: ?

## 2024-08-17 DIAGNOSIS — M3219 Other organ or system involvement in systemic lupus erythematosus: Principal | ICD-10-CM

## 2024-08-17 DIAGNOSIS — M533 Sacrococcygeal disorders, not elsewhere classified: Principal | ICD-10-CM

## 2024-08-17 MED ORDER — MYCOPHENOLATE MOFETIL 500 MG TABLET
ORAL_TABLET | Freq: Two times a day (BID) | ORAL | 0 refills | 90.00000 days | Status: CP
Start: 2024-08-17 — End: ?

## 2024-08-18 ENCOUNTER — Inpatient Hospital Stay: Admit: 2024-08-18 | Discharge: 2024-08-18 | Payer: Medicare (Managed Care)

## 2024-08-18 ENCOUNTER — Ambulatory Visit
Admit: 2024-08-18 | Discharge: 2024-08-18 | Payer: Medicare (Managed Care) | Attending: Student in an Organized Health Care Education/Training Program | Primary: Student in an Organized Health Care Education/Training Program

## 2024-08-19 ENCOUNTER — Ambulatory Visit: Admit: 2024-08-19 | Discharge: 2024-08-20 | Payer: Medicare (Managed Care) | Attending: Family | Primary: Family

## 2024-08-19 DIAGNOSIS — M3212 Pericarditis in systemic lupus erythematosus: Principal | ICD-10-CM

## 2024-08-19 DIAGNOSIS — M3219 Other organ or system involvement in systemic lupus erythematosus: Principal | ICD-10-CM

## 2024-08-19 DIAGNOSIS — Z5181 Encounter for therapeutic drug level monitoring: Principal | ICD-10-CM

## 2024-08-19 MED ORDER — HYDROXYCHLOROQUINE 200 MG TABLET
ORAL_TABLET | Freq: Every day | ORAL | 3 refills | 90.00000 days | Status: CP
Start: 2024-08-19 — End: ?

## 2024-08-25 DIAGNOSIS — G8929 Other chronic pain: Principal | ICD-10-CM

## 2024-08-25 MED ORDER — OXYCODONE 5 MG TABLET
ORAL_TABLET | Freq: Every day | ORAL | 0 refills | 30.00000 days | PRN
Start: 2024-08-25 — End: ?

## 2024-08-26 DIAGNOSIS — G8929 Other chronic pain: Principal | ICD-10-CM

## 2024-08-26 DIAGNOSIS — Z5181 Encounter for therapeutic drug level monitoring: Principal | ICD-10-CM

## 2024-08-26 MED ORDER — OXYCODONE 5 MG TABLET
ORAL_TABLET | Freq: Every day | ORAL | 0 refills | 30.00000 days | Status: CP | PRN
Start: 2024-08-26 — End: ?

## 2024-09-01 ENCOUNTER — Encounter: Admit: 2024-09-01 | Discharge: 2024-09-01

## 2024-09-01 DIAGNOSIS — N879 Dysplasia of cervix uteri, unspecified: Principal | ICD-10-CM

## 2024-09-01 DIAGNOSIS — N939 Abnormal uterine and vaginal bleeding, unspecified: Principal | ICD-10-CM

## 2024-09-01 DIAGNOSIS — Z01419 Encounter for gynecological examination (general) (routine) without abnormal findings: Principal | ICD-10-CM

## 2024-09-01 MED ORDER — NORETHINDRONE (CONTRACEPTIVE) 0.35 MG TABLET
ORAL_TABLET | Freq: Every day | ORAL | 1 refills | 84.00000 days | Status: CP
Start: 2024-09-01 — End: ?

## 2024-09-09 DIAGNOSIS — I10 Essential (primary) hypertension: Principal | ICD-10-CM

## 2024-09-09 DIAGNOSIS — E66811 Obesity (BMI 30.0-34.9): Secondary | ICD-10-CM

## 2024-09-09 MED ORDER — LOSARTAN 50 MG TABLET
ORAL_TABLET | Freq: Every day | ORAL | 2 refills | 90.00000 days | Status: CP
Start: 2024-09-09 — End: ?

## 2024-09-09 MED ORDER — METFORMIN 500 MG TABLET
ORAL_TABLET | Freq: Two times a day (BID) | ORAL | 2 refills | 90.00000 days | Status: CP
Start: 2024-09-09 — End: ?

## 2024-09-11 DIAGNOSIS — M328 Other forms of systemic lupus erythematosus: Principal | ICD-10-CM

## 2024-09-14 MED ORDER — MELOXICAM 7.5 MG TABLET
ORAL_TABLET | Freq: Two times a day (BID) | ORAL | 2 refills | 30.00000 days | Status: CP | PRN
Start: 2024-09-14 — End: ?
